# Patient Record
Sex: Female | Born: 1970 | Race: Black or African American | Hispanic: No | Smoking: Never smoker
Health system: Southern US, Community
[De-identification: ages and names within clinical notes are randomized; demographics above are authoritative.]

## PROBLEM LIST (undated history)

## (undated) DIAGNOSIS — F32A Depression, unspecified: Secondary | ICD-10-CM

## (undated) DIAGNOSIS — F419 Anxiety disorder, unspecified: Secondary | ICD-10-CM

## (undated) DIAGNOSIS — F329 Major depressive disorder, single episode, unspecified: Secondary | ICD-10-CM

## (undated) DIAGNOSIS — Z8659 Personal history of other mental and behavioral disorders: Secondary | ICD-10-CM

## (undated) DIAGNOSIS — F319 Bipolar disorder, unspecified: Secondary | ICD-10-CM

## (undated) HISTORY — DX: Bipolar disorder, unspecified: F31.9

## (undated) HISTORY — DX: Personal history of other mental and behavioral disorders: Z86.59

## (undated) HISTORY — DX: Depression, unspecified: F32.A

## (undated) HISTORY — DX: Anxiety disorder, unspecified: F41.9

## (undated) HISTORY — PX: GASTRIC BYPASS: SHX52

---
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
## 1999-01-06 ENCOUNTER — Ambulatory Visit (HOSPITAL_COMMUNITY): Admission: RE | Admit: 1999-01-06 | Discharge: 1999-01-06 | Payer: Self-pay | Admitting: Family Medicine

## 1999-01-06 ENCOUNTER — Encounter: Payer: Self-pay | Admitting: Family Medicine

## 2001-02-05 ENCOUNTER — Other Ambulatory Visit: Admission: RE | Admit: 2001-02-05 | Discharge: 2001-02-05 | Payer: Self-pay | Admitting: Obstetrics and Gynecology

## 2002-12-16 ENCOUNTER — Ambulatory Visit (HOSPITAL_COMMUNITY): Admission: RE | Admit: 2002-12-16 | Discharge: 2002-12-16 | Payer: Self-pay | Admitting: Family Medicine

## 2003-12-03 ENCOUNTER — Ambulatory Visit: Admission: RE | Admit: 2003-12-03 | Discharge: 2003-12-03 | Payer: Self-pay | Admitting: Family Medicine

## 2003-12-03 ENCOUNTER — Ambulatory Visit (HOSPITAL_COMMUNITY): Admission: RE | Admit: 2003-12-03 | Discharge: 2003-12-03 | Payer: Self-pay | Admitting: Family Medicine

## 2004-09-30 ENCOUNTER — Ambulatory Visit: Payer: Self-pay | Admitting: Psychiatry

## 2004-09-30 ENCOUNTER — Inpatient Hospital Stay (HOSPITAL_COMMUNITY): Admission: RE | Admit: 2004-09-30 | Discharge: 2004-10-03 | Payer: Self-pay | Admitting: Psychiatry

## 2004-12-21 ENCOUNTER — Other Ambulatory Visit: Admission: RE | Admit: 2004-12-21 | Discharge: 2004-12-21 | Payer: Self-pay | Admitting: Family Medicine

## 2008-08-07 ENCOUNTER — Encounter: Admission: RE | Admit: 2008-08-07 | Discharge: 2008-08-07 | Payer: Self-pay | Admitting: Family Medicine

## 2010-02-12 ENCOUNTER — Encounter: Payer: Self-pay | Admitting: Family Medicine

## 2010-06-10 NOTE — Discharge Summary (Signed)
[name]             ACCOUNT NO.:  [id number]   MEDICAL RECORD NO.:  [id number]          PATIENT TYPE:  IPS   LOCATION:  0501                          FACILITY:  [facility]   PHYSICIAN:  Syed T. [name], M.D.   DATE OF BIRTH:  [birth year]   DATE OF ADMISSION:  09/30/2004  DATE OF DISCHARGE:  10/03/2004                                 DISCHARGE SUMMARY   IDENTIFYING INFORMATION:  The patient is a 40 year old married African-  American female.  Apparently, she reported suicidal ideation to her  therapist yesterday who recommended for inpatient treatment.  The patient  reported a plan to dehydrate herself and to overdose.  The patient has been  having a lot of mood swings and had a hard time being with people face to  face.  The patient reports emotional issues began in February of 2006 when  she underwent gastric bypass at [facility].  To date, she has lost 111 pounds,  although postoperatively she had many problems.  She has to have PICC line  for at least three times. She said that she has been depressed. Says she has  numerous antidepressant trials including Celexa, Effexor, Cymbalta, and now  currently Prozac. She stated that she just mood swings. Her mood is labile  in the office, and she was here to get her medication corrected.   PAST PSYCHIATRIC HISTORY:  The patient had a similar incident when she was  telling the therapist that she was not doing well with her medication and  was admitted to [facility] for about 10 hours. She stated that  this was because she was refusing to take her nutrition, and she was having  a lot of postoperative pain. Recently, her Effexor was decreased and then  stopped, and this apparently caused a flare in her symptoms at this time.   ALCOHOL AND DRUG HISTORY:  Denies.   MEDICAL HISTORY:  The patient's primary care is through the [facility] at this point. Medical problems:  She takes calcium and  having some reflux for  which she takes Prilosec.   CURRENT MEDICATIONS:  1.  Prozac 20 mg p.o. daily.  2.  Prilosec 20 mg b.i.d.  3.  Calcium 1,000 mg daily.  4.  She takes on and off temazepam 15 mg to get sleep.   POSITIVE PHYSICAL FINDINGS:  The patient still remains morbidly obese  despite 111 pound weight loss to date since February. Her current weight is  285 pounds. She is 5 feet 5 inches. Temperature and vitals are stable.  Physical examination was well documented at [facility], but she has  still G tube site which is healing.   MENTAL STATUS EXAM:  The patient is alert and oriented x3; appropriately  groomed, dressed and nourished. Her speech was rapid at times. Her mood is  fluctuating. Thought processes logical, clear, rational and goal directed.  She said that she wanted to get better. She wanted to go home and reported a  good wife and a mother. Her insight and judgment are intact. Concentration  and memory are good.  She denies any suicidal or homicidal thoughts. She  denies any auditory or visual hallucinations.   DIAGNOSES:  AXIS I:  Mood disorder, not otherwise specified.  AXIS II:  Deferred.  AXIS III:  Status post gastric bypass surgery.  AXIS IV:  Going through her medical condition.  AXIS V:  40.   HOSPITAL COURSE:  The patient was admitted and was started on her regular  medication which was Prozac 20 mg, omeprazole 20 mg, and temazepam 15 mg as  p.r.n. and calcium 1,000 mg daily. She was placed on a safety check and then  participated in group therapy. The patient reported that she had in the past  some mood swings and some racing thoughts. With the milieu therapies, she  started to feel better. Geodon was discussed to start to control her mood  swings and racing thoughts. After one day, the patient stated that does not  need any antipsychotic, and she feels much better. She denies any  hopelessness or helplessness feeling, any suicidal active or passive and  feels much,  much better. She stated that she is ready for discharge. She  reported that her mood has been improved significantly. She appears to be  very articulate, well organized. Denies any thoughts to harm herself. The  patient stated that she wanted to go back to her medication and wanted to  follow up with Dr. Milford Cage on an outpatient basis. Discharge planning  discussed in detail and patient reported no current side effects with the  medication. Her condition on discharge was remarkable improved. Her mood was  euthymic, affect bright. Thought processes logical, goal directed. She  denies any auditory or visual hallucinations. Denies any active or passive  suicidal thoughts. She felt better with increased coping and social skills  and felt ready to be discharged.   DISCHARGE DIAGNOSES:  AXIS I:  Mood disorder, not otherwise specified.  AXIS II:  Deferred.  AXIS III:  Status post gastric bypass.  AXIS IV:  Moderate.  AXIS V:  75.   DISCHARGE MEDICATIONS:  1.  Prozac 20 mg 1 tablet daily.  2.  Restoril 15 mg nightly p.r.n. for insomnia.  3.  K-Dur 20 mEq 1 twice a day for 3 days and recommended to see a family      doctor to have the potassium level rechecked.  4.  Prilosec 20 mg twice daily.   DISCHARGE DISPOSITION:  The patient was discharged to see Dr. Milford Cage  on September 18 at 9:00, phone number [phone]. The patient is also  recommended to see her family physician to recheck her potassium. The  patient acknowledged the appointment and agreed with the followup.      Syed T. Lolly Mustache, M.D.  Electronically Signed     STA/MEDQ  D:  10/24/2004  T:  10/24/2004  Job:  [id number]

## 2010-06-10 NOTE — H&P (Signed)
NAMEJene Ball NO.:  [id number]   MEDICAL RECORD NO.:  [id number]          PATIENT TYPE:  IPS   LOCATION:  0501                          FACILITY:  [facility]   PHYSICIAN:  Geoffery Lyons, M.D.      DATE OF BIRTH:  January 12, 1971   DATE OF ADMISSION:  09/30/2004  DATE OF DISCHARGE:                         PSYCHIATRIC ADMISSION ASSESSMENT   IDENTIFYING INFORMATION:  This is a 40 year old married African-American  female.  Apparently she reported suicidal ideation to her therapist  yesterday who recommended an inpatient stay.  The patient reported a plan to  dehydrate herself and take pills.  She has been having mood swings.  She has  a hard time being with people face to face.   The patient reports emotional issues began in February of 2006 when she  underwent gastric bypass at [facility].  To date she was lost 111 pounds, although  postop she had many problems.  She has had to have a PIC line x3.  She was  thought to be depressed as she has had numerous anti depressive trials  including Celexa, Effexor, Cymbalta, and now currently Prozac.  She states  that she just has mood swings.  Indeed she is quite labile in the office,  and is here to get her medications corrected.   PAST PSYCHIATRIC HISTORY:  She had a similar incident where she was telling  the therapist that she was not doing well with her medications and she was  admitted to the [facility] psychiatric ward for about 10 hours, she stated.  This  was because she was refusing to take her nutrition.  She was having a lot of  postop pain.  Recently her Effexor was decreased and then stopped.  This has  apparently caused a flare in her symptoms at this time.   SOCIAL HISTORY:  She had 2 years of college.  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  She was fired and this  is due to all the times she has been absent since February.   FAMILY HISTORY:  She has one sister who has had some  depression.  Other than  that there is no history for mental illness.   ALCOHOL AND DRUG ABUSE:  She denies.   PAST MEDICAL HISTORY:  Her primary care is through the [facility]  program at this point.  Medical problems are she takes calcium and she is  having some reflux for which she takes Prilosec.  Currently prescribed  medications are Prozac 20 mg p.o. daily, Prilosec 20 mg b.i.d., Calcitite  1000 mg daily, and she has some Temazepam 15 mg at h.s. to help her sleep.   POSITIVE PHYSICAL FINDINGS:  PHYSICAL EXAMINATION:  She still remains  morbidly obese despite a 111 pound weight loss to date since February.  Her  weight currently is 285 pounds.  She is 5 feet 5 inches.  Her temperature is  98.7.  Blood pressure is 142/80 to 164/87.  Pulse is 62 to 67.  Respirations  are 20.  Her physical examination was well documented  at [facility]  but she still has a G-tube site that is healing.   MENTAL STATUS EXAM:  She is alert and oriented x3.  She is appropriately  groomed, dressed and nourished.  Her speech is rapid at times.  Her mood is  fluctuating.  Her thought process is clear, rational and goal oriented.  She  wants to get better.  She wants to go home and be a good wife and a mother.  Judgment and insight are intact.  Concentration and memory are intact.  Specifically she denies suicidal or homicidal ideation.  She denies auditory  or visual hallucinations.   ADMISSION DIAGNOSES:  AXIS I:  Mood disorder not otherwise specified.  AXIS II:  Deferred.  AXIS III:  Status post gastric bypass in February.  AXIS IV:  Just fired.  AXIS V:  Global assessment of function is 31.   PLAN:  Admit for safety and stabilization.  Toward that end, we will put her  on some K-Dur.  Her potassium was a little bit low at 3.1.  She states that  as her intake has been off for the last couple of days, it is easy for  people with gastric bypass to become dehydrated.  Given that weight  gain is  quite an issue for her, we will try some Geodon to see if we can stabilize  her mood and help her not feel so watched and talked about.  We will start  with 40 mg p.o. b.i.d. today and bump it to 80 tomorrow.      Mickie Leonarda Salon, P.A.-C.      Geoffery Lyons, M.D.  Electronically Signed    MD/MEDQ  D:  10/01/2004  T:  10/01/2004  Job:  [id number]

## 2011-05-02 DIAGNOSIS — F603 Borderline personality disorder: Secondary | ICD-10-CM | POA: Insufficient documentation

## 2012-08-05 DIAGNOSIS — L6681 Central centrifugal cicatricial alopecia: Secondary | ICD-10-CM | POA: Insufficient documentation

## 2012-08-05 DIAGNOSIS — L669 Cicatricial alopecia, unspecified: Secondary | ICD-10-CM | POA: Insufficient documentation

## 2013-03-25 ENCOUNTER — Other Ambulatory Visit: Payer: Self-pay

## 2013-03-25 DIAGNOSIS — Z1231 Encounter for screening mammogram for malignant neoplasm of breast: Secondary | ICD-10-CM

## 2013-03-31 ENCOUNTER — Ambulatory Visit: Payer: Self-pay

## 2013-11-10 DIAGNOSIS — G4733 Obstructive sleep apnea (adult) (pediatric): Secondary | ICD-10-CM | POA: Insufficient documentation

## 2013-11-10 DIAGNOSIS — K219 Gastro-esophageal reflux disease without esophagitis: Secondary | ICD-10-CM | POA: Insufficient documentation

## 2016-06-20 ENCOUNTER — Other Ambulatory Visit: Payer: Self-pay | Admitting: Family Medicine

## 2016-06-20 ENCOUNTER — Ambulatory Visit
Admission: RE | Admit: 2016-06-20 | Discharge: 2016-06-20 | Disposition: A | Discharge status: Home / Self Care | Payer: Medicare Other | Source: Ambulatory Visit | Attending: Family Medicine | Admitting: Family Medicine

## 2016-06-20 DIAGNOSIS — R109 Unspecified abdominal pain: Secondary | ICD-10-CM

## 2016-08-30 ENCOUNTER — Other Ambulatory Visit: Payer: Self-pay | Admitting: Family Medicine

## 2016-08-30 ENCOUNTER — Ambulatory Visit
Admission: RE | Admit: 2016-08-30 | Discharge: 2016-08-30 | Disposition: A | Discharge status: Home / Self Care | Payer: Medicare Other | Source: Ambulatory Visit | Attending: Family Medicine | Admitting: Family Medicine

## 2016-08-30 DIAGNOSIS — M25561 Pain in right knee: Secondary | ICD-10-CM

## 2016-12-01 ENCOUNTER — Other Ambulatory Visit: Payer: Self-pay | Admitting: Gastroenterology

## 2016-12-01 DIAGNOSIS — R1012 Left upper quadrant pain: Secondary | ICD-10-CM

## 2016-12-01 DIAGNOSIS — K289 Gastrojejunal ulcer, unspecified as acute or chronic, without hemorrhage or perforation: Secondary | ICD-10-CM

## 2016-12-13 ENCOUNTER — Ambulatory Visit
Admission: RE | Admit: 2016-12-13 | Discharge: 2016-12-13 | Disposition: A | Discharge status: Home / Self Care | Payer: Medicare Other | Source: Ambulatory Visit | Attending: Gastroenterology | Admitting: Gastroenterology

## 2016-12-13 DIAGNOSIS — R1012 Left upper quadrant pain: Secondary | ICD-10-CM

## 2016-12-13 DIAGNOSIS — K289 Gastrojejunal ulcer, unspecified as acute or chronic, without hemorrhage or perforation: Secondary | ICD-10-CM

## 2016-12-13 MED ORDER — IOPAMIDOL (ISOVUE-300) INJECTION 61%
125.0000 mL | Freq: Once | INTRAVENOUS | Status: AC | PRN
  Administered 2016-12-13: 125 mL via INTRAVENOUS

## 2017-04-21 LAB — GLUCOSE, POCT (MANUAL RESULT ENTRY): POC Glucose: 69 mg/dl — AB (ref 70–99)

## 2017-07-25 ENCOUNTER — Other Ambulatory Visit: Payer: Self-pay

## 2017-07-25 ENCOUNTER — Emergency Department (HOSPITAL_COMMUNITY)
Admission: EM | Admit: 2017-07-25 | Discharge: 2017-07-26 | Disposition: A | Discharge status: Home / Self Care | Payer: Medicare Other | Attending: Emergency Medicine | Admitting: Emergency Medicine

## 2017-07-25 ENCOUNTER — Encounter (HOSPITAL_COMMUNITY): Payer: Self-pay | Admitting: Emergency Medicine

## 2017-07-25 ENCOUNTER — Emergency Department (HOSPITAL_COMMUNITY): Payer: Medicare Other

## 2017-07-25 DIAGNOSIS — R0789 Other chest pain: Secondary | ICD-10-CM

## 2017-07-25 LAB — CBC
HCT: 35.7 % — ABNORMAL LOW (ref 36.0–46.0)
Hemoglobin: 10.9 g/dL — ABNORMAL LOW (ref 12.0–15.0)
MCH: 24.8 pg — ABNORMAL LOW (ref 26.0–34.0)
MCHC: 30.5 g/dL (ref 30.0–36.0)
MCV: 81.3 fL (ref 78.0–100.0)
Platelets: 269 10*3/uL (ref 150–400)
RBC: 4.39 MIL/uL (ref 3.87–5.11)
RDW: 16.4 % — ABNORMAL HIGH (ref 11.5–15.5)
WBC: 8.9 10*3/uL (ref 4.0–10.5)

## 2017-07-25 NOTE — ED Triage Notes (Signed)
Pt reports she was sleeping and awoke with L sided CP under her breast into her back. N/ and diaphoresis. Denies cardiac hx. No ASA today.

## 2017-07-26 DIAGNOSIS — R0789 Other chest pain: Secondary | ICD-10-CM | POA: Diagnosis not present

## 2017-07-26 LAB — BASIC METABOLIC PANEL
Anion gap: 6 (ref 5–15)
BUN: 10 mg/dL (ref 6–20)
CO2: 26 mmol/L (ref 22–32)
Calcium: 8.8 mg/dL — ABNORMAL LOW (ref 8.9–10.3)
Chloride: 106 mmol/L (ref 98–111)
Creatinine, Ser: 1.01 mg/dL — ABNORMAL HIGH (ref 0.44–1.00)
GFR calc Af Amer: >60 mL/min (ref >60)
GFR calc non Af Amer: >60 mL/min (ref >60)
Glucose, Bld: 83 mg/dL (ref 70–99)
Potassium: 4 mmol/L (ref 3.5–5.1)
Sodium: 138 mmol/L (ref 135–145)

## 2017-07-26 LAB — I-STAT TROPONIN, ED: Troponin i, poc: 0 ng/mL (ref 0.00–0.08)

## 2017-07-26 LAB — RAPID URINE DRUG SCREEN, HOSP PERFORMED
Amphetamines: NOT DETECTED
Benzodiazepines: NOT DETECTED
Cocaine: NOT DETECTED
Opiates: NOT DETECTED
Tetrahydrocannabinol: NOT DETECTED

## 2017-07-26 LAB — D-DIMER, QUANTITATIVE (NOT AT ARMC): D-Dimer, Quant: <0.27 ug/mL-FEU (ref 0.00–0.50)

## 2017-07-26 LAB — LITHIUM LEVEL: Lithium Lvl: 0.89 mmol/L (ref 0.60–1.20)

## 2017-07-26 LAB — PREGNANCY, URINE: Preg Test, Ur: NEGATIVE

## 2017-07-26 LAB — TROPONIN I
Troponin I: <0.03 ng/mL (ref <0.03)
Troponin I: <0.03 ng/mL (ref <0.03)

## 2017-07-26 MED ORDER — GI COCKTAIL ~~LOC~~
30.0000 mL | Freq: Once | ORAL | Status: AC
  Administered 2017-07-26: 30 mL via ORAL
  Filled 2017-07-26: qty 30

## 2017-07-26 MED ORDER — KETOROLAC TROMETHAMINE 30 MG/ML IJ SOLN
30.0000 mg | Freq: Once | INTRAMUSCULAR | Status: AC
  Administered 2017-07-26: 30 mg via INTRAVENOUS
  Filled 2017-07-26: qty 1

## 2017-07-26 MED ORDER — OMEPRAZOLE 20 MG PO CPDR: 20.0000 mg | DELAYED_RELEASE_CAPSULE | Freq: Every day | ORAL | 0 refills | Status: DC

## 2017-07-26 MED ORDER — NYSTATIN 100000 UNIT/GM EX CREA: TOPICAL_CREAM | CUTANEOUS | 0 refills | Status: DC

## 2017-07-26 NOTE — Discharge Instructions (Addendum)
There is no evidence of heart attack or blood clot in the lung.  As we discussed your low risk for heart disease but should follow-up for a stress test with your doctor.  If your chest pain becomes worse, associated with exertion, shortness of breath, nausea, vomiting or sweating or any other concerns.

## 2017-07-26 NOTE — ED Provider Notes (Signed)
[facility] EMERGENCY DEPARTMENT Provider Note   CSN: [id number] Arrival date & time: 07/25/17  2258     History   Chief Complaint Chief Complaint  Patient presents with  . Chest Pain    HPI Susan Ball is a 47 y.o. female.  Patient presents with left-sided chest pain that woke her from sleep.  It starts underneath her left breast and radiates to her side and back.  The pain is constant.  She has not had this kind of pain in the past.  She denies any cough, shortness of breath or fever.  She does have some nausea but no diaphoresis.  She denies any cardiac history.  She is being treated for a possible bug bite to her right ankle and is been on doxycycline for the past 3 days.  She did not see what bit here but thinks it may have been a tick.  Her pain is not worse with exertion or deep breathing.  Denies any rash.  The history is provided by the patient.  Chest Pain   Pertinent negatives include no abdominal pain, no dizziness, no fever, no headaches, no nausea, no shortness of breath, no vomiting and no weakness.    History reviewed. No pertinent past medical history.  There are no active problems to display for this patient.   Past Surgical History:  Procedure Laterality Date  . GASTRIC BYPASS       OB History   None      Home Medications    Prior to Admission medications   Not on File    Family History History reviewed. No pertinent family history.  Social History Social History   Tobacco Use  . Smoking status: Never Smoker  . Smokeless tobacco: Never Used  Substance Use Topics  . Alcohol use: Not Currently  . Drug use: Not Currently     Allergies   Patient has no known allergies.   Review of Systems Review of Systems  Constitutional: Negative for activity change, appetite change and fever.  HENT: Negative for congestion, mouth sores and nosebleeds.   Eyes: Negative for visual disturbance.  Respiratory: Positive for chest tightness.  Negative for shortness of breath.   Cardiovascular: Positive for chest pain.  Gastrointestinal: Negative for abdominal pain, nausea and vomiting.  Genitourinary: Negative for dysuria, hematuria, vaginal bleeding and vaginal discharge.  Musculoskeletal: Negative for arthralgias and myalgias.  Skin: Positive for wound.  Neurological: Negative for dizziness, weakness and headaches.     Physical Exam Updated Vital Signs BP (!) 142/78 (BP Location: Right Arm)   Pulse 63   Temp 98.4 F (36.9 C) (Oral)   Resp 18   Ht 5\' 5"  (1.651 m)   Wt 108 kg (238 lb)   LMP 07/11/2017   SpO2 100%   BMI 39.61 kg/m   Physical Exam  Constitutional: She is oriented to person, place, and time. She appears well-developed and well-nourished. No distress.  HENT:  Head: Normocephalic and atraumatic.  Mouth/Throat: Oropharynx is clear and moist. No oropharyngeal exudate.  Eyes: Pupils are equal, round, and reactive to light. Conjunctivae and EOM are normal.  Neck: Normal range of motion. Neck supple.  No meningismus.  Cardiovascular: Normal rate, regular rhythm, normal heart sounds and intact distal pulses.  No murmur heard. Pulmonary/Chest: Effort normal and breath sounds normal. No respiratory distress. She exhibits tenderness.  Chaperone present.  Patient indicates her chest pain is underneath her left breast where she has hyper keratotic and erythematous skin.  She denies any tenderness to palpation of this area or along her ribs.  Abdominal: Soft. There is no tenderness. There is no rebound and no guarding.  Musculoskeletal: Normal range of motion. She exhibits no edema or tenderness.  Hyperpigmentation and induration to right ankle without fluctuance  Neurological: She is alert and oriented to person, place, and time. No cranial nerve deficit. She exhibits normal muscle tone. Coordination normal.  No ataxia on finger to nose bilaterally. No pronator drift. 5/5 strength throughout. CN 2-12 intact.Equal  grip strength. Sensation intact.   Skin: Skin is warm. Capillary refill takes less than 2 seconds. No rash noted.  Psychiatric: She has a normal mood and affect. Her behavior is normal.  Nursing note and vitals reviewed.    ED Treatments / Results  Labs (all labs ordered are listed, but only abnormal results are displayed) Labs Reviewed  BASIC METABOLIC PANEL - Abnormal; Notable for the following components:      Result Value   Creatinine, Ser 1.01 (*)    Calcium 8.8 (*)    All other components within normal limits  CBC - Abnormal; Notable for the following components:   Hemoglobin 10.9 (*)    HCT 35.7 (*)    MCH 24.8 (*)    RDW 16.4 (*)    All other components within normal limits  RAPID URINE DRUG SCREEN, HOSP PERFORMED - Abnormal; Notable for the following components:   Barbiturates   (*)    Value: Result not available. Reagent lot number recalled by manufacturer.   All other components within normal limits  TROPONIN I  PREGNANCY, URINE  D-DIMER, QUANTITATIVE (NOT AT [facility])  LITHIUM LEVEL  TROPONIN I  I-STAT TROPONIN, ED    EKG EKG Interpretation  Date/Time:  Wednesday July 25 2017 23:39:20 EDT Ventricular Rate:  60 PR Interval:  144 QRS Duration: 90 QT Interval:  451 QTC Calculation: 451 R Axis:   27 Text Interpretation:  Sinus rhythm No significant change was found Confirmed by Glynn Octaveancour, Makael Stein [id number]) on 07/25/2017 11:45:24 PM   Radiology Dg Chest 2 View  Result Date: 07/26/2017 CLINICAL DATA:  Chest pain EXAM: CHEST - 2 VIEW COMPARISON:  06/24/2014 FINDINGS: The heart size and mediastinal contours are within normal limits. Both lungs are clear. The visualized skeletal structures are unremarkable. IMPRESSION: No active cardiopulmonary disease. Electronically Signed   By: Jasmine PangKim  Fujinaga M.D.   On: 07/26/2017 00:42    Procedures Procedures (including critical care time)  Medications Ordered in ED Medications  ketorolac (TORADOL) 30 MG/ML injection 30 mg (30 mg  Intravenous Given 07/26/17 0039)  gi cocktail (Maalox,Lidocaine,Donnatal) (30 mLs Oral Given 07/26/17 0040)     Initial Impression / Assessment and Plan / ED Course  I have reviewed the triage vital signs and the nursing notes.  Pertinent labs & imaging results that were available during my care of the patient were reviewed by me and considered in my medical decision making (see chart for details).    Patient with left-sided chest pain that woke her from sleep about 1 hour ago.  Her EKG is normal sinus rhythm.  Patient given Toradol for suspected musculoskeletal chest pain.  She does have intertriginous rash underneath her breast at the area of pain.  Troponin is negative.  D-dimer is negative. Heart score 1-2.  Delta troponin is negative.  Patient's chest pain has resolved after Toradol.  Low suspicion for ACS, pulmonary embolism, aortic dissection.  We will treat supportively with empiric PPI.  We will  also give nystatin cream for intertriginous rash.  Follow-up with PCP.  Return precautions discussed including worsening chest pain especially that is exertional, associated with shortness of breath, diaphoresis, vomiting or other concerns.  Final Clinical Impressions(s) / ED Diagnoses   Final diagnoses:  Atypical chest pain    ED Discharge Orders    None       Pheng Prokop, Jeannett Senior, MD 07/26/17 [phone]

## 2017-08-16 LAB — GLUCOSE, POCT (MANUAL RESULT ENTRY): POC Glucose: 75 mg/dl (ref 70–99)

## 2019-03-12 ENCOUNTER — Other Ambulatory Visit: Payer: Self-pay

## 2019-03-12 ENCOUNTER — Other Ambulatory Visit (HOSPITAL_COMMUNITY): Payer: Medicare Other | Attending: Psychiatry | Admitting: Psychiatry

## 2019-03-12 DIAGNOSIS — Z9884 Bariatric surgery status: Secondary | ICD-10-CM | POA: Insufficient documentation

## 2019-03-12 DIAGNOSIS — F603 Borderline personality disorder: Secondary | ICD-10-CM | POA: Insufficient documentation

## 2019-03-12 DIAGNOSIS — F319 Bipolar disorder, unspecified: Secondary | ICD-10-CM | POA: Insufficient documentation

## 2019-03-12 DIAGNOSIS — F4310 Post-traumatic stress disorder, unspecified: Secondary | ICD-10-CM | POA: Insufficient documentation

## 2019-03-12 DIAGNOSIS — F419 Anxiety disorder, unspecified: Secondary | ICD-10-CM | POA: Insufficient documentation

## 2019-03-12 DIAGNOSIS — Z79899 Other long term (current) drug therapy: Secondary | ICD-10-CM | POA: Insufficient documentation
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
## 2019-03-13 ENCOUNTER — Encounter (HOSPITAL_COMMUNITY): Payer: Self-pay | Admitting: Psychiatry

## 2019-03-13 ENCOUNTER — Other Ambulatory Visit: Payer: Self-pay

## 2019-03-13 ENCOUNTER — Other Ambulatory Visit (HOSPITAL_COMMUNITY): Payer: Medicare Other | Admitting: Psychiatry

## 2019-03-13 DIAGNOSIS — F319 Bipolar disorder, unspecified: Secondary | ICD-10-CM | POA: Diagnosis present

## 2019-03-13 DIAGNOSIS — Z9884 Bariatric surgery status: Secondary | ICD-10-CM | POA: Diagnosis not present

## 2019-03-13 DIAGNOSIS — F603 Borderline personality disorder: Secondary | ICD-10-CM | POA: Diagnosis not present

## 2019-03-13 DIAGNOSIS — F331 Major depressive disorder, recurrent, moderate: Secondary | ICD-10-CM

## 2019-03-13 DIAGNOSIS — F419 Anxiety disorder, unspecified: Secondary | ICD-10-CM | POA: Diagnosis not present

## 2019-03-13 DIAGNOSIS — Z79899 Other long term (current) drug therapy: Secondary | ICD-10-CM | POA: Diagnosis not present

## 2019-03-13 DIAGNOSIS — F4310 Post-traumatic stress disorder, unspecified: Secondary | ICD-10-CM | POA: Diagnosis not present

## 2019-03-13 NOTE — Progress Notes (Signed)
Virtual Visit via Video Note  I connected with Susan Ball on 03/13/19 at  9:00 AM EST by a video enabled telemedicine application and verified that I am speaking with the correct person using two identifiers.  Location: Patient: Patient Home Provider: Home Office   Case Manger discussed the limitations of evaluation and management by telemedicine and the availability of in person appointments during orientation. The patient expressed understanding and agreed to proceed.  History of Present Illness: MDD  Observations/Objective: Case Manager checked in with all participants to review discharge dates, insurance authorizations, work-related documents and needs for the treatment team. Counselor introduced guest speaker, Sheppard Coil, [employer] Educator, to facilitate a discussion around Grief and Loss topics. Patient participated in discussion and shared insights about their own needs regarding this topic. Counselor allowed time for reflection and to journal on the topic of grief/loss and promoted continuing this work in individual therapy and local support groups.   Counselor facilitated a check in with group members to gage mood and current functioning as well as their takeaways from the presentation. Today is Susan Ball's first IOP treatment session, so Counselor prompted her to share what brought her to treatment, what she is hoping to gain from the experience, current life stressors and about her natural support system.  Susan Ball shared about the impact of COVID on her coping abilities and mental health. Susan Ball noted chronic mental health concerns and need for medication evaluation, as well as new coping strategies. Susan Ball noted that exercising is her most helpful coping skill at this time. She is supported by her husband and her son. [name] presents with severe depression and moderate anxiety.   Counselor introduced Doctor, hospital, Forde Radon, Yoga Instructor, to guide the group in a yoga practice  to promote mind and body connection, mindfulness and relaxation. Counselor checked in with all participants to assess the benefits.   Assessment and Plan: Counselor recommends that patient remains in IOP treatment to better manage mental health symptoms and continue to address treatment plan goals. Counselor recommends adherence to crisis/safety plan, taking medications as prescribed and following up with medical professionals if any issues arise.   Follow Up Instructions: Counselor will send Webex link for next session.    I discussed the assessment and treatment plan with the patient. The patient was provided an opportunity to ask questions and all were answered. The patient agreed with the plan and demonstrated an understanding of the instructions.   The patient was advised to call back or seek an in-person evaluation if the symptoms worsen or if the condition fails to improve as anticipated.  I provided 180 minutes of non-face-to-face time during this encounter.   Hilbert Odor, LCSW

## 2019-03-14 ENCOUNTER — Other Ambulatory Visit (HOSPITAL_COMMUNITY): Payer: Medicare Other | Admitting: Psychiatry

## 2019-03-14 ENCOUNTER — Other Ambulatory Visit: Payer: Self-pay

## 2019-03-14 ENCOUNTER — Encounter (HOSPITAL_COMMUNITY): Payer: Self-pay

## 2019-03-14 DIAGNOSIS — F331 Major depressive disorder, recurrent, moderate: Secondary | ICD-10-CM

## 2019-03-14 DIAGNOSIS — F319 Bipolar disorder, unspecified: Secondary | ICD-10-CM | POA: Diagnosis not present

## 2019-03-14 NOTE — Progress Notes (Signed)
Virtual Visit via Telephone Note  I connected with Susan Ball on 03/14/19 at  9:00 AM EST by telephone and verified that I am speaking with the correct person using two identifiers.   I discussed the limitations, risks, security and privacy concerns of performing an evaluation and management service by telephone and the availability of in person appointments. I also discussed with the patient that there may be a patient responsible charge related to this service. The patient expressed understanding and agreed to proceed.   I discussed the assessment and treatment plan with the patient. The patient was provided an opportunity to ask questions and all were answered. The patient agreed with the plan and demonstrated an understanding of the instructions.   The patient was advised to call back or seek an in-person evaluation if the symptoms worsen or if the condition fails to improve as anticipated.  I provided 30 minutes of non-face-to-face time during this encounter.   [name], NP
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
## 2019-03-17 ENCOUNTER — Other Ambulatory Visit (HOSPITAL_COMMUNITY): Payer: Medicare Other

## 2019-03-17 ENCOUNTER — Other Ambulatory Visit: Payer: Self-pay
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
## 2019-03-18 ENCOUNTER — Other Ambulatory Visit: Payer: Self-pay

## 2019-03-18 ENCOUNTER — Other Ambulatory Visit (HOSPITAL_COMMUNITY): Payer: Medicare Other | Admitting: Psychiatry

## 2019-03-18 ENCOUNTER — Encounter (HOSPITAL_COMMUNITY): Payer: Self-pay

## 2019-03-18 DIAGNOSIS — F319 Bipolar disorder, unspecified: Secondary | ICD-10-CM | POA: Diagnosis not present

## 2019-03-18 DIAGNOSIS — F331 Major depressive disorder, recurrent, moderate: Secondary | ICD-10-CM
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
## 2019-03-19 ENCOUNTER — Other Ambulatory Visit: Payer: Self-pay

## 2019-03-19 ENCOUNTER — Encounter (HOSPITAL_COMMUNITY): Payer: Self-pay

## 2019-03-19 ENCOUNTER — Other Ambulatory Visit (HOSPITAL_COMMUNITY): Payer: Medicare Other | Admitting: Psychiatry

## 2019-03-19 DIAGNOSIS — F331 Major depressive disorder, recurrent, moderate: Secondary | ICD-10-CM

## 2019-03-19 DIAGNOSIS — F319 Bipolar disorder, unspecified: Secondary | ICD-10-CM | POA: Diagnosis not present

## 2019-03-19 NOTE — Progress Notes (Signed)
Virtual Visit via Video Note  I connected with Susan Ball on 03/19/19 at  9:00 AM EST by a video enabled telemedicine application and verified that I am speaking with the correct person using two identifiers.  Location: Patient: Patient Home Provider: Home Office   Case Manger discussed the limitations of evaluation and management by telemedicine and the availability of in person appointments during orientation. The patient expressed understanding and agreed to proceed.  History of Present Illness: MDD   Observations/Objective: Case Manager checked in with all participants to review discharge dates, insurance authorizations, work-related documents and needs for the treatment team.   Counselor introduced our guest speaker, Peggye Fothergill, [facility] Pharmacist, who shared about psychiatric medications, side effects, treatment considerations and how to communicate with medical professionals. Group Members asked questions and shared medication concerns. Counselor prompted group members to reference a worksheet called, "Body Scan" to jot down questions and concerns about their physical health in preparation for their upcoming appointments with medical professionals. Susan Ball was not able to identify any medical issues at this time. Counselor encouraged routine medical check-ups, preparing for appointments, following up with recommendations and seeking specialist if needed.   Counselor introduced the topic of Resiliency to the group, prompting them to share their understanding of the concept. Counselor then facilitated a Resiliency Questionnaire for the patients to assessed their score based on experiences and interactions with caregivers/trusted adults in childhood. Counselor shared psychoeducation on the components of Resiliency and prompted group members to share about their resiliency score and what they attribute to their resiliency. Patient shared that she has a motto of "keep going, keep wishing, the  strength is within me"." She attributes her resiliency to her strong faith and belief system. She scored a 10/14.   Counselor shifted to discussing the concept of self-compassion, sharing information from Dr. Baxter Hire Neff's research on the components and assessing the patients understanding of how to implement practices within their own lives. Patient shared that she actively self-soothes when she isn't feeling well. She would like to reward self more and let herself off the hook for negative thoughts and interactions with others.   Counselor prompted group members to share a self-care task and productivity activity they will do between now and the next group treatment session to alleviate stress. Susan Ball would like to work out at [employer] today and cook a meal for her family.   Assessment and Plan: Counselor recommends that patient remains in IOP treatment to better manage mental health symptoms and continue to address treatment plan goals. Counselor recommends adherence to crisis/safety plan, taking medications as prescribed and following up with medical professionals if any issues arise.   Follow Up Instructions: Counselor will send Webex link for next session.    I discussed the assessment and treatment plan with the patient. The patient was provided an opportunity to ask questions and all were answered. The patient agreed with the plan and demonstrated an understanding of the instructions.   The patient was advised to call back or seek an in-person evaluation if the symptoms worsen or if the condition fails to improve as anticipated.  I provided 180 minutes of non-face-to-face time during this encounter.   Hilbert Odor, LCSW

## 2019-03-20 ENCOUNTER — Encounter (HOSPITAL_COMMUNITY): Payer: Self-pay

## 2019-03-20 ENCOUNTER — Other Ambulatory Visit: Payer: Self-pay

## 2019-03-20 ENCOUNTER — Other Ambulatory Visit (HOSPITAL_COMMUNITY): Payer: Medicare Other | Admitting: Licensed Clinical Social Worker

## 2019-03-20 DIAGNOSIS — F319 Bipolar disorder, unspecified: Secondary | ICD-10-CM | POA: Diagnosis not present

## 2019-03-20 DIAGNOSIS — F331 Major depressive disorder, recurrent, moderate: Secondary | ICD-10-CM

## 2019-03-20 NOTE — Progress Notes (Signed)
Virtual Visit via Video Note  I connected with Susan Ball on 03/20/19 at  9:00 AM EST by a video enabled telemedicine application and verified that I am speaking with the correct person using two identifiers.  Location: Patient: Patient Home Provider: Home Office   Case Manger discussed the limitations of evaluation and management by telemedicine and the availability of in person appointments during orientation. The patient expressed understanding and agreed to proceed.  History of Present Illness: MDD  Observations/Objective: Case Manager checked in with all participants to review discharge dates, insurance authorizations, work-related documents and needs for the treatment team. Counselor introduced guest speaker, Susan Ball, [employer] Educator, to facilitate a discussion around Grief and Loss topics. Patient participated in discussion and shared insights about their own needs regarding this topic. Susan Ball stated that she preferred to listen to the information shared and wasn't able to verbally express the impact of loss in her life. Counselor allowed time for reflection and to journal on the topic of grief/loss and promoted continuing this work in individual therapy and local support groups.   Counselor engaged the group in self-soothing practices using a guide from Dr. Haze Rushing, prompting them to share which was most soothing for them. Patient shared noted that she preferred resting her head on her hands in a leaning position. She noted it reminded her of being comforted by her husband.    Counselor facilitated a check in with group members to gage mood and current functioning as well as their takeaways from the presentation. Susan Ball noted that she did not have the drive to cook a meal for her family yesterday. She took time to do her hair and to work out at [employer]. She plans to get blood work done today. Group additionally engaged in discussion about Mental Health advocacy on  the job, highlighting how to set boundaries, communicate needs and transition back in a healthy way.  Counselor introduced Event organiser from [employer] to present on their programming. Susan Ball shared about Peer Support, Support Groups and Wellness Programs to [employer] of mental health symptoms. Group members gathered information and shared about which events most sparked their interest and their plans to engage in services. Susan Ball notes that she would like to get connected with the WRAP class, Road to Recovery Support Group and the Friends and Family Support Group.   Assessment and Plan: Counselor recommends that patient remains in IOP treatment to better manage mental health symptoms and continue to address treatment plan goals. Counselor recommends adherence to crisis/safety plan, taking medications as prescribed and following up with medical professionals if any issues arise.   Follow Up Instructions: Counselor will send Webex link for next session.    I discussed the assessment and treatment plan with the patient. The patient was provided an opportunity to ask questions and all were answered. The patient agreed with the plan and demonstrated an understanding of the instructions.   The patient was advised to call back or seek an in-person evaluation if the symptoms worsen or if the condition fails to improve as anticipated.  I provided 180 minutes of non-face-to-face time during this encounter.   Hilbert Odor, LCSW

## 2019-03-21 ENCOUNTER — Other Ambulatory Visit (HOSPITAL_COMMUNITY): Payer: Medicare Other

## 2019-03-21 ENCOUNTER — Other Ambulatory Visit: Payer: Self-pay

## 2019-03-24 ENCOUNTER — Other Ambulatory Visit (HOSPITAL_COMMUNITY): Payer: Medicare Other | Attending: Psychiatry | Admitting: Psychiatry

## 2019-03-24 ENCOUNTER — Other Ambulatory Visit: Payer: Self-pay

## 2019-03-24 DIAGNOSIS — Z79899 Other long term (current) drug therapy: Secondary | ICD-10-CM | POA: Diagnosis not present

## 2019-03-24 DIAGNOSIS — Z915 Personal history of self-harm: Secondary | ICD-10-CM | POA: Insufficient documentation

## 2019-03-24 DIAGNOSIS — F331 Major depressive disorder, recurrent, moderate: Secondary | ICD-10-CM

## 2019-03-24 DIAGNOSIS — F319 Bipolar disorder, unspecified: Secondary | ICD-10-CM | POA: Insufficient documentation

## 2019-03-24 DIAGNOSIS — F603 Borderline personality disorder: Secondary | ICD-10-CM | POA: Diagnosis not present

## 2019-03-24 NOTE — Progress Notes (Signed)
Virtual Visit via Video Note   I connected with Susan Ball, who prefers to go by "Susan Ball" on 03/24/19 at  9:00 AM EST by a video enabled telemedicine application and verified that I am speaking with the correct person using two identifiers.   Location: Patient: Patient Home Provider: [facility] OPT Office   Case Manager discussed the limitations of evaluation and management by telemedicine and the availability of in person appointments during orientation. The patient expressed understanding and agreed to proceed.   History of Present Illness: MDD   Observations/Objective: Case Manager checked in with all participants to review discharge dates, insurance authorizations, work-related documents and needs for the treatment team.    Counselor co-facilitated group with Hilbert Odor, LCSW today.  Two [employer] students were also allowed to observe with approval from group members.  Counselor facilitated a check-in with group members to gauge current mood, functioning, and also identify recent progress made towards treatment goals. Susan Ball reported that this past week was difficult due to stress at work, and she is frustrated by Insurance account manager.  Susan Ball reported that she came home the other day and began crying because of how she was feeling after the shift, and her husband pointed out that she appears to be bringing the stress home now rather than leaving it at work.  Susan Ball reported that she was able to exercise on Saturday and Sunday to vent some of her stress, and she intends to make this a regular routine to help cope.     Counselor engaged the group in discussion on managing work/life balance today to improve mental health and wellness.  Counselor explained how finding balance between responsibilities at home and work place can be challenging, lead to increased stress, and this has been further complicated by recent pandemic leading to unemployment, more virtual work, and blurring of lines between home as a place of rest  or work duties.  Counselor facilitated discussion on what challenges members have faced with this issue historically, as well as what, if any, issues have arisen following pandemic.  Susan Ball reported that she enjoyed her old position at work, but was forced to transition recently, which led to her increased stress.  Susan Ball reported that she used to feel like she had more control of her schedule and personal autonomy, but feels more helpless now and lacks support.  Susan Ball also reported that due to being an introvert, she does not socialize much, and disclosed that she will spend a lot of free time at home, alone, not engaging in activity.      Counselor also discussed strategies for improving work/life balance while members work on their mental health during treatment.  Some of these included keeping track of time management; creating a list of priorities and scaling importance; setting realistic, measurable goals each day; establishing boundaries; taking care of health needs; and nurturing relationships at home and work for support.  Counselor inquired about areas where members feel they are excelling, as well as areas they could focus on during treatment.  Susan Ball reported that she is typically a very hard and efficient worker, excelling at tracking use of time, prioritizing tasks, and accomplishing goals day to day.  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  Susan Ball was receptive to suggestions from counselor and other members to coordinate with co-workers at  work to ensure fair distribution of responsibilities, be patient and understanding with herself, and explore more hobbies to engage in during idle time outside of work for emotional outlet.  Susan Ball reiterated "I have to stop coming home with it".     Assessment and Plan: Counselor recommends  that patient remains in IOP treatment to better manage mental health symptoms and continue to address treatment plan goals. Counselor recommends adherence to crisis/safety plan, taking medications as prescribed and following up with medical professionals if any issues arise.    Follow Up Instructions: Counselor will send Webex link for next session.    I discussed the assessment and treatment plan with the patient. The patient was provided an opportunity to ask questions and all were answered. The patient agreed with the plan and demonstrated an understanding of the instructions.   The patient was advised to call back or seek an in-person evaluation if the symptoms worsen or if the condition fails to improve as anticipated.   I provided 180 minutes of non-face-to-face time during this encounter.  Shade Flood, LCSW, LCAS

## 2019-03-25 ENCOUNTER — Other Ambulatory Visit (HOSPITAL_COMMUNITY): Payer: Medicare Other | Admitting: Psychiatry

## 2019-03-25 ENCOUNTER — Other Ambulatory Visit: Payer: Self-pay

## 2019-03-25 ENCOUNTER — Encounter (HOSPITAL_COMMUNITY): Payer: Self-pay

## 2019-03-25 DIAGNOSIS — F331 Major depressive disorder, recurrent, moderate: Secondary | ICD-10-CM

## 2019-03-25 DIAGNOSIS — F319 Bipolar disorder, unspecified: Secondary | ICD-10-CM | POA: Diagnosis not present

## 2019-03-25 NOTE — Progress Notes (Signed)
Virtual Visit via Video Note  I connected with [name] on 03/25/19 at  9:00 AM EST by a video enabled telemedicine application and verified that I am speaking with the correct person using two identifiers.  Location: Patient: Patient Home Provider: Home Office  Case Manger discussed the limitations of evaluation and management by telemedicine and the availability of in person appointments during orientation. The patient expressed understanding and agreed to proceed.   History of Present Illness: MDD   Observations/Objective: Case Manager checked in with all participants to review discharge dates, insurance authorizations, work-related documents and needs for the treatment team. Counselor processed current mood and functioning and discussed how participants spent their time since last session and if skills were applied. Patient shared that she had a "really, really good day", noting improved mood, smiling and laughing as she shared about her day. [name] noted that she went to the gym at a different time than normal, and enjoyed the difference in age range/skill level of those present. She discussed being motivated to show off her weight lifting skills to the others. Noting it boosted her self-esteem and self-confidence. [name] presents with mild anxiety and moderate depression.   Counselor provided psychoeducation on cognitive distortions, exploring their baseline understanding, providing a educational video of 15 cognitive distortions, presenting 10 strategies for changing cognitive distortions to rational thinking. Counselor facilitated a therapeutic CBT activity to rewrite negative automatic thoughts using techniques learned from the 10 strategies. Group members shared their work with each other highlighting new thoughts and challenges they faced with the activity. Patient shared that she is a "black and white" thinker, having difficulties with acknowledging or believing in grey areas. She is  very rules oriented, making the "shoulds and should not" distortions a challenge for her. She used the strategies to rewrite common automatic thoughts for her, but found it challenging.   Counselor ended session by acknowledging a graduating group member by prompting graduating member to reflect on progress made, takeaways from treatment and plan for stepping down. Counselor and group members shared observations of growth, encouragement and support as she transitions out of the program.   Counselor prompted group members to share a self-care task and productivity activity they will do between now and the next group treatment session to alleviate stress. 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   Assessment and Plan: Counselor recommends that patient remains in IOP treatment to better manage mental health symptoms and continue to address treatment plan goals. Counselor recommends adherence to crisis/safety plan, taking medications as prescribed and following up with medical professionals if any issues arise.   Follow Up Instructions: Counselor will send Webex link for next session.  The patient was advised to call back or seek an in-person evaluation if the symptoms worsen or if the condition fails to improve as anticipated.  I provided 180 minutes of non-face-to-face time during this encounter.   Hilbert Odor, LCSW

## 2019-03-26 ENCOUNTER — Other Ambulatory Visit: Payer: Self-pay

## 2019-03-26 ENCOUNTER — Other Ambulatory Visit (HOSPITAL_COMMUNITY): Payer: Medicare Other | Admitting: Licensed Clinical Social Worker

## 2019-03-26 DIAGNOSIS — F319 Bipolar disorder, unspecified: Secondary | ICD-10-CM | POA: Diagnosis not present

## 2019-03-26 DIAGNOSIS — F331 Major depressive disorder, recurrent, moderate: Secondary | ICD-10-CM

## 2019-03-26 NOTE — Progress Notes (Signed)
Virtual Visit via Video Note   I connected with Susan Ball, who prefers to go by "Susan Ball", on 03/26/19 at 9:00 AM EST by a video enabled telemedicine application and verified that I am speaking with the correct person using two identifiers.   Location: Patient: Patient Home Provider: [name]   Case Manager discussed the limitations of evaluation and management by telemedicine and the availability of in person appointments during orientation. The patient expressed understanding and agreed to proceed.   History of Present Illness: MDD   Observations/Objective: Case Manager checked in with all participants to review discharge dates, insurance authorizations, work-related documents and needs for the treatment team. Counselor facilitated a check-in with group members to gauge mood and current functioning as well as identify recent progress towards treatment goals.  Susan Ball reported that she was doing well today and did not have much to share.  She reported that she had taken the day off from work and was spending time relaxing at home to de-stress, and did not have plans.     Counselor provided recap on psychoeducation regarding cognitive distortions, including which ones members felt they displayed most often, and how these have impacted mood and behavior.  Counselor also did recap of strategies which could be implemented to challenge irrational thinking, including writing down negative thoughts in a journal to label and confront more effectively, looking for evidence that supports questionable thinking, making an effort to challenge negative self-talk by replacing it with more positive phrases such as "You've got this", "This will pass", etc, and attempting to replace negative labels (failure, loser, etc) with more positive, uplifting ones. Counselor attempted to get Susan Ball engaged in conversation, but she wrote in chat window that she was "Here just can't respond" and had a low signal.  Clinician  acknowledged this in chat, but she did not send anymore messages regarding this subject.       Counselor also provided psychoeducation on personal boundaries today.  Counselor provided worksheet via chat which explained how these are the limits and rules we place upon ourselves in relationships, and involves the ability to say "No" to unreasonable demands.  Common traits for different categories were covered, including rigid, porous, and healthy boundaries.  Members were encouraged to consider which traits they most often display, and discussion centered upon how this impacts interactions with supports, and whether changes could be made to improve relationships.  Counselor again tried to get Susan Ball involved in discussion by asking in chat which boundary traits she could relate to, but she did not respond until roughly 10 minutes later, stating "I feel like I've waste these 3 hours of my life today.  I can answer but I refuse".  Counselor and group members encouraged Susan [facility] to open up about her concerns and what would have improved quality of session.  Susan Ball could be seen briefly in the virtual window, but she would not respond, and shook her head.  Susan Ball also contradicted her previous statement about having a low signal by later typing "I had no tech issues".  Counselor offered to speak privately with Susan Ball after group, but she declined, and remained unengaged for rest of session.     Counselor ended session by acknowledging a graduating group member by prompting the member to reflect on progress made, takeaways from treatment and plan for stepping down. Counselor and group members shared observations of growth, encouragement and support as she transitions out of the program.  Susan Ball did not share during this completion  discussion.  Counselor informed case Freight forwarder and nurse of Susan Ball's presentation during session after group concluded.       Assessment and Plan: Counselor recommends that patient remains in IOP treatment to  better manage mental health symptoms and continue to address treatment plan goals. Counselor recommends adherence to crisis/safety plan, taking medications as prescribed and following up with medical professionals if any issues arise.    Follow Up Instructions: Counselor will send Webex link for next session.  The patient was advised to call back or seek an in-person evaluation if the symptoms worsen or if the condition fails to improve as anticipated.   I provided 180 minutes of non-face-to-face time during this encounter.     Shade Flood, LCSW, LCAS

## 2019-03-27 ENCOUNTER — Other Ambulatory Visit: Payer: Self-pay

## 2019-03-27 ENCOUNTER — Other Ambulatory Visit (HOSPITAL_COMMUNITY): Payer: Medicare Other | Admitting: Family

## 2019-03-27 DIAGNOSIS — F331 Major depressive disorder, recurrent, moderate: Secondary | ICD-10-CM | POA: Insufficient documentation

## 2019-03-27 NOTE — Progress Notes (Signed)
Virtual Visit via Video Note  I connected with Susan Ball on 03/27/19 at  9:00 AM EST by a video enabled telemedicine application and verified that I am speaking with the correct person using two identifiers.   I discussed the limitations of evaluation and management by telemedicine and the availability of in person appointments. The patient expressed understanding and agreed to proceed.  Location: Patient: Patient Home Provider: Home Office  Case Manger discussed the limitations of evaluation and management by telemedicine and the availability of in person appointments during orientation. The patient expressed understanding and agreed to proceed.  History of Present Illness: MDD   Observations/Objective: Case Manager checked in with all participants to review discharge dates, insurance authorizations, work-related documents and needs for the treatment team. Counselor introduced guest speaker, Susan Ball, [employer] Educator, to facilitate a discussion around Grief and Loss topics. Patient participated in discussion and shared insights about their own needs regarding this topic. Counselor allowed time for reflection and to journal on the topic of grief/loss and promoted continuing this work in individual therapy and local support groups.    Counselor facilitated a check in with group members to gage mood and current functioning as well as their takeaways from the presentation. Susan Ball notes processing and reflecting on her grief and loss issues in this way has been helpful in how she looks at healing and addressing ongoing loss issues. Pt identified metaphors as a good way for her to communicate with self and others on what she is feeling/experiencing/thinking. Susan Ball identified with a lion catching its prey and how the interaction isn't done until the lion eats its prey or until the prey escapes. Susan Ball reports severe anxiety and depression.   Counselor introduced Doctor, hospital, Susan Ball, Yoga Instructor, to guide the group in a yoga practice to promote mind and body connection, mindfulness and relaxation. Counselor checked in with all participants to assess the benefits.    Assessment and Plan: Counselor recommends that patient remains in IOP treatment to better manage mental health symptoms and continue to address treatment plan goals. Counselor recommends adherence to crisis/safety plan, taking medications as prescribed and following up with medical professionals if any issues arise.   Follow Up Instructions: Counselor will send Webex link for next session.    The patient was advised to call back or seek an in-person evaluation if the symptoms worsen or if the condition fails to improve as anticipated.   I provided 180 minutes of non-face-to-face time during this encounter.   Bardia Wangerin Lauro Franklin, LCMHCA, LCASA

## 2019-03-28 ENCOUNTER — Telehealth (HOSPITAL_COMMUNITY): Payer: Self-pay | Admitting: Psychiatry

## 2019-03-28 ENCOUNTER — Other Ambulatory Visit: Payer: Self-pay

## 2019-03-28 ENCOUNTER — Other Ambulatory Visit (HOSPITAL_COMMUNITY): Payer: Medicare Other | Admitting: Psychiatry

## 2019-03-31 ENCOUNTER — Encounter (HOSPITAL_COMMUNITY): Payer: Self-pay | Admitting: Family

## 2019-03-31 NOTE — Progress Notes (Signed)
Virtual Visit via Telephone Note  I connected with [name] on 04/01/19 at  9:00 AM EST by telephone and verified that I am speaking with the correct person using two identifiers.   I discussed the limitations, risks, security and privacy concerns of performing an evaluation and management service by telephone and the availability of in person appointments. I also discussed with the patient that there may be a patient responsible charge related to this service. The patient expressed understanding and agreed to proceed.  I discussed the assessment and treatment plan with the patient. The patient was provided an opportunity to ask questions and all were answered. The patient agreed with the plan and demonstrated an understanding of the instructions.   The patient was advised to call back or seek an in-person evaluation if the symptoms worsen or if the condition fails to improve as anticipated.  I provided 15 minutes of non-face-to-face time during this encounter.   [name], NP   [facility]ensive [facility] Program Discharge Summary  GATHA MCNULTY [id number]  Admission date: 03/12/2019 Discharge date: 04/01/2019  Reason for admission: Per admission assessment note: Tabrina Esty 49 year old African-American female was evaluated telephonically.  She reports previous history with mental health illness.  Reports current diagnoses with depression, bipolar and borderline personality disorder.  Reports she is currently followed by [facility] however states she is unable to attend due to her insurance.  Reports she is prescribed Prozac 40 mg, lithium 900 mg, BuSpar 15 mg and clonidine 0.5 mg.  She reports taking and tolerating medications well.  States she has been followed by a therapist for the past 10 years.  Reported [facility] inpatient [facility] for attempted suicide.  States she attempted to overdose 2 years ago.  Which was her last  inpatient admission   Progress in Program Toward Treatment Goals: Ongoing, patient attended and participated with daily group session with active and engaged participation.  Denying suicidal or homicidal ideations during this assessment.  Rates her depression 5 out of 10 with 10 being the worst.  Reports overall the program has helped with coping skills.  Patient did not elaborate with improved symptoms due to being assessed while working.  Patient to keep all follow-up appointments.  Progress (rationale): Patient to keep follow-up with [name] LCSW on 04/09/2019@ 10am and Dr. Lolly Mustache 04/11/2019 @ 09:00  Take all medications as prescribed. Keep all follow-up appointments as scheduled.  Do not consume alcohol or use illegal drugs while on prescription medications. Report any adverse effects from your medications to your primary care provider promptly.  In the event of recurrent symptoms or worsening symptoms, call 911, a crisis hotline, or go to the nearest emergency department for evaluation.   [name], NP 03/31/2019

## 2019-04-01 ENCOUNTER — Other Ambulatory Visit (HOSPITAL_COMMUNITY): Payer: Medicare Other | Admitting: Professional

## 2019-04-01 ENCOUNTER — Other Ambulatory Visit: Payer: Self-pay

## 2019-04-01 DIAGNOSIS — F331 Major depressive disorder, recurrent, moderate: Secondary | ICD-10-CM

## 2019-04-01 NOTE — Patient Instructions (Signed)
D:  Patient completed MH-IOP today.  A:  Discharge today.  Follow up with [name], LCSW on 04-09-19 @ 10 a.m and Dr. Lolly Mustache on 04-11-19 @ 9a.m.  Encouraged support groups.  Recommended the anger group at [facility] [phone].  R: Patient receptive.

## 2019-04-01 NOTE — Progress Notes (Signed)
Virtual Visit via Video Note  I connected with Susan Ball on @TODAY @ at 0800 by a video enabled telemedicine application and verified that I am speaking with the correct person using two identifiers.  I discussed the limitations of evaluation and management by telemedicine and the availability of in person appointments. The patient expressed understanding and agreed to proceed.  I discussed the assessment and treatment plan with the patient. The patient was provided an opportunity to ask questions and all were answered. The patient agreed with the plan and demonstrated an understanding of the instructions.   The patient was advised to call back or seek an in-person evaluation if the symptoms worsen or if the condition fails to improve as anticipated.  I provided 20 minutes of non-face-to-face time during this encounter.   Patient ID: Susan Ball, female   DOB: [birth year], 49 y.o.   MRN: [id number] As per previous [facility] note: This is a 49 yr old, married, employed, 42 American female who was referred per [facility] (no longer accept pt's insurance), treatment for worsening depressive, anxiety symptoms, anger outbursts with passive SI.  According to pt, she has had sx's for yrs but they started to worsen 10 yrs ago and just recently.  "I was diagnosed Borderline Disorder and Bipolar D/O ten years ago, but I've noticed that I've been having this anger to the point of lashing out verbally and physically."  Pt states the SI is off and on depending on her mood.  Discussed higher of level options, but pt declined, stating she wants MH-IOP.  Discussed safety options at length.  Pt is able to contract for safety.  Denies HI or A/V hallucinations.  Stressors:  1) Marriage of three years.  2)  Job of three years.  Pt is a Susan Ball.  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  States the job has been difficult since pandemic.  3)  No support system.  Pt admits to two prior inpatient psychiatric admissions (ie.  6 yrs ago at [facility] and 5 yrs ago [facility]).  States both admits were due to suicide attempts (ie. jumping from car and OD).  "You name it I've done it.  I've had so many attempts in the past."  Pt has been with Susan Ball for ~ five yrs.  Was seeing Dr. [facility] Ball and Hyacinth Meeker, LCSW.  Family hx:  Siblings (Depression). Patients Currently Reported Symptoms/Problems: Sadness, anxious, isolative, anhedonia, no motivation, poor energy, tearful, irritable, poor sleep, ruminating thoughts, anger outbursts  Pt completed MH-IOP today.  She attended 10 days (virtual).  Reports overall feeling better (ie. Less anxious).  States she continues to struggle with anger issues and on and off passive SI.  Currently denies any SI.  Also denies HI or A/V hallucinations.  "Groups were a big help.  I did learn some skills." On a scale of 1-10 (10 being the worst); pt rated anxiety a 4 and depression a 6. A:  D/C today.  F/U with Susan Ball on 04-09-19 @ 10 a.m and Dr. 09-05-1988 on 04-11-19 @ 9a.m.  Strongly recommended the anger group at [facility].  Pt probably could benefit from DBT also.  Continue working prn as she has been doing.  R:  Pt receptive.   11-03-2005, M.Ed,CNA

## 2019-04-09 ENCOUNTER — Other Ambulatory Visit: Payer: Self-pay

## 2019-04-09 ENCOUNTER — Encounter (HOSPITAL_COMMUNITY): Payer: Self-pay | Admitting: Psychiatry

## 2019-04-09 ENCOUNTER — Ambulatory Visit (INDEPENDENT_AMBULATORY_CARE_PROVIDER_SITE_OTHER): Payer: Medicare Other | Admitting: Psychiatry

## 2019-04-09 DIAGNOSIS — F331 Major depressive disorder, recurrent, moderate: Secondary | ICD-10-CM | POA: Diagnosis not present
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
## 2019-04-11 ENCOUNTER — Other Ambulatory Visit: Payer: Self-pay

## 2019-04-11 ENCOUNTER — Ambulatory Visit (INDEPENDENT_AMBULATORY_CARE_PROVIDER_SITE_OTHER): Payer: Medicare Other | Admitting: Psychiatry

## 2019-04-11 ENCOUNTER — Telehealth (HOSPITAL_COMMUNITY): Payer: Self-pay | Admitting: *Deleted

## 2019-04-11 ENCOUNTER — Encounter (HOSPITAL_COMMUNITY): Payer: Self-pay | Admitting: Psychiatry

## 2019-04-11 ENCOUNTER — Other Ambulatory Visit (HOSPITAL_COMMUNITY): Payer: Self-pay | Admitting: Psychiatry

## 2019-04-11 DIAGNOSIS — F603 Borderline personality disorder: Secondary | ICD-10-CM | POA: Diagnosis not present

## 2019-04-11 DIAGNOSIS — Z79899 Other long term (current) drug therapy: Secondary | ICD-10-CM

## 2019-04-11 DIAGNOSIS — F319 Bipolar disorder, unspecified: Secondary | ICD-10-CM | POA: Diagnosis not present

## 2019-04-11 NOTE — Telephone Encounter (Signed)
Writer left VM for pt instructing her to have labs drawn, Lithium level, TSH, CBC with DIFF, HgbA1c, and CMP with GFR, at [facility] @ [address], [place]. Writer left both address and phone number for lab. Pt encouraged to call clinic back with any questions or concerns.
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
## 2019-04-12 LAB — LITHIUM LEVEL: Lithium Lvl: 0.7 mmol/L (ref 0.6–1.2)

## 2019-04-12 LAB — COMPREHENSIVE METABOLIC PANEL
ALT: 12 IU/L (ref 0–32)
AST: 19 IU/L (ref 0–40)
Albumin/Globulin Ratio: 1.7 (ref 1.2–2.2)
Albumin: 4 g/dL (ref 3.8–4.9)
Alkaline Phosphatase: 73 IU/L (ref 39–117)
BUN/Creatinine Ratio: 8 — ABNORMAL LOW (ref 9–23)
BUN: 7 mg/dL (ref 6–24)
Bilirubin Total: 0.3 mg/dL (ref 0.0–1.2)
CO2: 23 mmol/L (ref 20–29)
Calcium: 9.5 mg/dL (ref 8.7–10.2)
Chloride: 105 mmol/L (ref 96–106)
Creatinine, Ser: 0.91 mg/dL (ref 0.57–1.00)
GFR calc Af Amer: 84 mL/min/{1.73_m2} (ref >59)
GFR calc non Af Amer: 73 mL/min/{1.73_m2} (ref >59)
Globulin, Total: 2.4 g/dL (ref 1.5–4.5)
Glucose: 73 mg/dL (ref 65–99)
Potassium: 4.4 mmol/L (ref 3.5–5.2)
Sodium: 140 mmol/L (ref 134–144)
Total Protein: 6.4 g/dL (ref 6.0–8.5)

## 2019-04-12 LAB — CBC/DIFF AMBIGUOUS DEFAULT
Basophils Absolute: 0.1 10*3/uL (ref 0.0–0.2)
Basos: 2 %
EOS (ABSOLUTE): 0.1 10*3/uL (ref 0.0–0.4)
Eos: 2 %
Hematocrit: 35.8 % (ref 34.0–46.6)
Hemoglobin: 10.6 g/dL — ABNORMAL LOW (ref 11.1–15.9)
Immature Grans (Abs): 0 10*3/uL (ref 0.0–0.1)
Immature Granulocytes: 0 %
Lymphocytes Absolute: 2.4 10*3/uL (ref 0.7–3.1)
Lymphs: 40 %
MCH: 22.9 pg — ABNORMAL LOW (ref 26.6–33.0)
MCHC: 29.6 g/dL — ABNORMAL LOW (ref 31.5–35.7)
MCV: 78 fL — ABNORMAL LOW (ref 79–97)
Monocytes Absolute: 0.5 10*3/uL (ref 0.1–0.9)
Monocytes: 9 %
Neutrophils Absolute: 2.9 10*3/uL (ref 1.4–7.0)
Neutrophils: 47 %
Platelets: 346 10*3/uL (ref 150–450)
RBC: 4.62 x10E6/uL (ref 3.77–5.28)
RDW: 14.9 % (ref 11.7–15.4)
WBC: 6 10*3/uL (ref 3.4–10.8)

## 2019-04-12 LAB — TSH: TSH: 2.51 u[IU]/mL (ref 0.450–4.500)

## 2019-04-12 LAB — SPECIMEN STATUS REPORT

## 2019-04-12 LAB — HGB A1C W/O EAG: Hgb A1c MFr Bld: 5 % (ref 4.8–5.6)

## 2019-04-21 NOTE — Progress Notes (Signed)
Virtual Visit via Video Note  I connected with Susan Ball on 04/01/19 at  9:00 AM EST by a video enabled telemedicine application and verified that I am speaking with the correct person using two identifiers.   I discussed the limitations of evaluation and management by telemedicine and the availability of in person appointments. The patient expressed understanding and agreed to proceed.  Location: Patient: Patient Home Provider: Home Office   Case Manger discussed the limitations of evaluation and management by telemedicine and the availability of in person appointments during orientation. The patient expressed understanding and agreed to proceed.   History of Present Illness: MDD    Observations/Objective: Case Manager checked in with all participants to review discharge dates, insurance authorizations, work-related documents and needs for the treatment team. Counselor processed current mood and functioning and discussed how participants spent their time since last session and if skills were applied. Pt reports she spent the evening with her husband and was able to "leave the work stuff at the door." Pt reports there was "drama" at work yesterday "and I didn't let it affect me." Pt is able to process. Cln encourages pt to identify her role and ability to manage her emotions around the "drama" and allowing herself to enjoy time with her husband instead of dwelling. Pt presents with moderate depression and mild anxiety.   Counselor provided psychoeducation on SMART GOALS and needs/self-care. Clinician led the group in discussing the self-care/needs assessment. Patients identified and discussed areas of life where improvements need to be made. Pt identified needs in areas related to professional (maintaining a work/life balance) and physical/emotional health. Pt reports she will identify her top 3 areas of need and complete SMART GOALs to meet the needs. Counselor conducted check-out with  patients. Pt reports she has afternoon plans of spending time with her husband and going grocery shopping. Pt is discharging from IOP today and reports feeling good about stepping down to individual counseling and psychiatry.    Assessment and Plan: Counselor recommends that patient discharge from IOP and continue counseling in individual setting. Counselor recommends adherence to crisis/safety plan, taking medications as prescribed and following up with medical professionals if any issues arise.    Follow Up Instructions: The patient was advised to call back or seek an in-person evaluation if the symptoms worsen or if the condition fails to improve as anticipated.   I provided 180 minutes of non-face-to-face time during this encounter.   Quinn Axe, [facility]

## 2019-04-24 ENCOUNTER — Ambulatory Visit: Payer: Medicaid Other | Attending: Internal Medicine

## 2019-04-24 DIAGNOSIS — Z23 Encounter for immunization: Secondary | ICD-10-CM

## 2019-04-24 NOTE — Progress Notes (Signed)
   Covid-19 Vaccination Clinic  Name:  Susan Ball    MRN: [id number] DOB: [birth year]  04/24/2019  Susan Ball was observed post Covid-19 immunization for 15 minutes without incident. She was provided with Vaccine Information Sheet and instruction to access the V-Safe system.   Susan Ball was instructed to call 911 with any severe reactions post vaccine: Marland Kitchen Difficulty breathing  . Swelling of face and throat  . A fast heartbeat  . A bad rash all over body  . Dizziness and weakness   Immunizations Administered    Name Date Dose VIS Date Route   Pfizer COVID-19 Vaccine 04/24/2019  9:56 AM 0.3 mL 01/03/2019 Intramuscular   Manufacturer: [employer], [employer]   Lot: [id number]   NDC: [address]-8

## 2019-05-07 ENCOUNTER — Ambulatory Visit (INDEPENDENT_AMBULATORY_CARE_PROVIDER_SITE_OTHER): Payer: Medicare Other | Admitting: Psychiatry

## 2019-05-07 ENCOUNTER — Encounter (HOSPITAL_COMMUNITY): Payer: Self-pay | Admitting: Psychiatry

## 2019-05-07 ENCOUNTER — Other Ambulatory Visit: Payer: Self-pay

## 2019-05-07 DIAGNOSIS — F419 Anxiety disorder, unspecified: Secondary | ICD-10-CM

## 2019-05-07 DIAGNOSIS — F603 Borderline personality disorder: Secondary | ICD-10-CM

## 2019-05-07 DIAGNOSIS — F319 Bipolar disorder, unspecified: Secondary | ICD-10-CM

## 2019-05-07 MED ORDER — FLUOXETINE HCL 40 MG PO CAPS: 40.0000 mg | ORAL_CAPSULE | Freq: Every day | ORAL | 0 refills | Status: DC

## 2019-05-07 MED ORDER — LITHIUM CARBONATE ER 450 MG PO TBCR: EXTENDED_RELEASE_TABLET | ORAL | 0 refills | Status: DC

## 2019-05-07 MED ORDER — CLONIDINE HCL 0.1 MG PO TABS: 0.1000 mg | ORAL_TABLET | Freq: Every day | ORAL | 0 refills | Status: DC

## 2019-05-07 NOTE — Progress Notes (Signed)
Virtual Visit via Telephone Note  I connected with Susan Ball on 05/07/19 at  2:20 PM EDT by telephone and verified that I am speaking with the correct person using two identifiers.   I discussed the limitations, risks, security and privacy concerns of performing an evaluation and management service by telephone and the availability of in person appointments. I also discussed with the patient that there may be a patient responsible charge related to this service. The patient expressed understanding and agreed to proceed.   History of Present Illness: Patient is 49 year old African-American married employed female who is seen first time 4 weeks ago.  She is referred from IOP.  She was seen at [facility] but her insurance does not cover the services.  She is taking lithium, Prozac and clonidine.  Today she is upset because she did not know it is a virtual visit and she came all the way from [place] to [place] office.  Patient told her my chart did not mention it was a virtual visit.  So far she is taking medication and reported no major side effects.  She is in therapy with Maurice Small.  She still struggles with her mood, irritability and anger but medicine had helped her a lot.  She had blood work which was recommended on the last visit.  Her lithium level is 0.7.  Her BUN/creatinine is normal.  She has anemia and I recommend that she should consult PCP to address low hemoglobin.  She agreed with the plan.  She is working as a Corporate treasurer.  She reported her relationship with her husband is somewhat better as he is more supportive.  Patient told the car which husband has a correct is still not fixed but in process.  She is getting along better with her husband.  She still have trouble sleeping but at this time does not want to add any more medication.  She used to take BuSpar but she is discontinued because she felt it is making her more sad.  She endorsed having anxiety in some time panic attacks.  She  denies any recent self harming behavior.  She has chronic issue with trust.  She like to continue her current medication.    Past Psychiatric History: H/O Bipolar, anxiety and borderline traits. Taking meds since age 57.  H/O multiple inpatient and suicidal attempt.  H/O overdose, jump from the car and banging head.  Tried Lamictal (rash), Seroquel, Abilify, doxepin, Trazadone and hydroxyzine but did not work.  H/O n/c with meds, refusing to eat and needed PICC line. H/O anger, road rage, paranoia and trust issues. Had DBT with good response.    Recent Results (from the past 2160 hour(s))  CBC/Diff Ambiguous Default     Status: Abnormal   Collection Time: 04/11/19  1:58 PM  Result Value Ref Range   WBC 6.0 3.4 - 10.8 x10E3/uL   RBC 4.62 3.77 - 5.28 x10E6/uL   Hemoglobin 10.6 (L) 11.1 - 15.9 g/dL   Hematocrit 35.8 34.0 - 46.6 %   MCV 78 (L) 79 - 97 fL   MCH 22.9 (L) 26.6 - 33.0 pg   MCHC 29.6 (L) 31.5 - 35.7 g/dL   RDW 14.9 11.7 - 15.4 %   Platelets 346 150 - 450 x10E3/uL   Neutrophils 47 Not Estab. %   Lymphs 40 Not Estab. %   Monocytes 9 Not Estab. %   Eos 2 Not Estab. %   Basos 2 Not Estab. %   Neutrophils Absolute  2.9 1.4 - 7.0 x10E3/uL   Lymphocytes Absolute 2.4 0.7 - 3.1 x10E3/uL   Monocytes Absolute 0.5 0.1 - 0.9 x10E3/uL   EOS (ABSOLUTE) 0.1 0.0 - 0.4 x10E3/uL   Basophils Absolute 0.1 0.0 - 0.2 x10E3/uL   Immature Granulocytes 0 Not Estab. %   Immature Grans (Abs) 0.0 0.0 - 0.1 x10E3/uL    Comment: A hand-written panel/profile was received from your office. In accordance with the [facility] Ambiguous Test Code Policy dated July 2003, we have assigned CBC with Differential/Platelet, Test Code #005009 to this request. If this is not the testing you wished to receive on this specimen, please contact the [facility] Department to clarify the test order. We appreciate your business.   Comprehensive metabolic panel     Status: Abnormal    Collection Time: 04/11/19  1:58 PM  Result Value Ref Range   Glucose 73 65 - 99 mg/dL   BUN 7 6 - 24 mg/dL   Creatinine, Ser 0.27 0.57 - 1.00 mg/dL   GFR calc non Af Amer 73 >59 mL/min/1.73   GFR calc Af Amer 84 >59 mL/min/1.73   BUN/Creatinine Ratio 8 (L) 9 - 23   Sodium 140 134 - 144 mmol/L   Potassium 4.4 3.5 - 5.2 mmol/L   Chloride 105 96 - 106 mmol/L   CO2 23 20 - 29 mmol/L   Calcium 9.5 8.7 - 10.2 mg/dL   Total Protein 6.4 6.0 - 8.5 g/dL   Albumin 4.0 3.8 - 4.9 g/dL   Globulin, Total 2.4 1.5 - 4.5 g/dL   Albumin/Globulin Ratio 1.7 1.2 - 2.2   Bilirubin Total 0.3 0.0 - 1.2 mg/dL   Alkaline Phosphatase 73 39 - 117 IU/L   AST 19 0 - 40 IU/L   ALT 12 0 - 32 IU/L  Hgb A1c w/o eAG     Status: None   Collection Time: 04/11/19  1:58 PM  Result Value Ref Range   Hgb A1c MFr Bld 5.0 4.8 - 5.6 %    Comment:          Prediabetes: 5.7 - 6.4          Diabetes: >6.4          Glycemic control for adults with diabetes: <7.0   TSH     Status: None   Collection Time: 04/11/19  1:58 PM  Result Value Ref Range   TSH 2.510 0.450 - 4.500 uIU/mL  Lithium level     Status: None   Collection Time: 04/11/19  1:58 PM  Result Value Ref Range   Lithium Lvl 0.7 0.6 - 1.2 mmol/L    Comment:                                  Detection Limit = 0.1                           <0.1 indicates None Detected   Specimen status report     Status: None   Collection Time: 04/11/19  1:58 PM  Result Value Ref Range   specimen status report Comment     Comment: [facility] [place] CMP14 Default Ambig Abbrev CMP14 Default A hand-written panel/profile was received from your office. In accordance with the [facility] Ambiguous Test Code Policy dated July 2003, we have completed your order by using the closest currently or formerly recognized AMA  panel.  We have assigned Comprehensive Metabolic Panel (14), Test Code #322000 to this request.  If this is not the testing you wished to receive on this specimen, please contact  the [facility] Client Inquiry/Technical Services Department to clarify the test order.  We appreciate your business.     Psychiatric Specialty Exam: Physical Exam  Review of Systems  There were no vitals taken for this visit.There is no height or weight on file to calculate BMI.  General Appearance: NA  Eye Contact:  NA  Speech:  Slow  Volume:  Normal  Mood:  Irritable  Affect:  NA  Thought Process:  Descriptions of Associations: Intact  Orientation:  Full (Time, Place, and Person)  Thought Content:  Rumination  Suicidal Thoughts:  No  Homicidal Thoughts:  No  Memory:  Immediate;   Fair Recent;   Fair Remote;   Good  Judgement:  Intact  Insight:  Present  Psychomotor Activity:  NA  Concentration:  Concentration: Fair and Attention Span: Fair  Recall:  Good  Fund of Knowledge:  Good  Language:  Good  Akathisia:  No  Handed:  Right  AIMS (if indicated):     Assets:  Communication Skills Desire for Improvement Housing Resilience  ADL's:  Intact  Cognition:  WNL  Sleep:   fair     Assessment and Plan: Bipolar disorder type I.  Anxiety.  Borderline traits.  I apologize having the wrong message on the my chart.  I recommend she should call us in the future to have a clarification about nature of the visit.  I also recommend that she can try lithium 450 mg twice a day to help her better sleep.  She will continue to get 900 mg lithium but in divided doses.  I also reviewed blood work results with her.  Recommend to see her PCP to address low hemoglobin.  Encouraged to continue therapy with [name].  Continue Prozac 40 mg daily and clonidine 0.1 mg at bedtime.  Recommended to call us back if she has any question or any concern.  Follow-up in 2 months.  If she like lithium to take twice a day then she will call us to get more refills.  She want to try for 1 month and agreed to give [name] a call for future refills if needed.  Follow Up Instructions:    I discussed the assessment  and treatment plan with the patient. The patient was provided an opportunity to ask questions and all were answered. The patient agreed with the plan and demonstrated an understanding of the instructions.   The patient was advised to call back or seek an in-person evaluation if the symptoms worsen or if the condition fails to improve as anticipated.  I provided 20 minutes of non-face-to-face time during this encounter.   Cleotis Nipper, MD

## 2019-05-08 ENCOUNTER — Telehealth (HOSPITAL_COMMUNITY): Payer: Self-pay

## 2019-05-08 NOTE — Telephone Encounter (Signed)
Pt called stating she received the "wrong dose" of her Prozac prescription. Pt reports she had been taking four 20mg  tabs, total of 80mg  per day. Pt was ordered 40mg  daily. Please review and advise.

## 2019-05-08 NOTE — Telephone Encounter (Signed)
This is a very high dose.  She should not be taking 80 mg.  We can try 60 mg and if she agree please call 20 mg take 3 pills a day.

## 2019-05-08 NOTE — Telephone Encounter (Signed)
Spoke with pharmacist at [facility]. Pt was prescribed total of 80mg /day by most recently in February 2021.

## 2019-05-08 NOTE — Telephone Encounter (Signed)
Please call her pharmacy because she was getting Prozac from her primary care physician.  80 mg Prozac is very high and she should not be taking 80 mg.  We need to clarify the dose.

## 2019-05-09 ENCOUNTER — Telehealth (HOSPITAL_COMMUNITY): Payer: Self-pay

## 2019-05-09 NOTE — Telephone Encounter (Signed)
I returned patient's phone call.  She told that her PCP has given Prozac 80 mg and she is been taking for a while.  I explained 80 mg is very high dose and do not see any benefit taking such a high dose and given the diagnosis of bipolar disorder it may be making her more irritable.  She does not want to drop Prozac from 80 mg to 40 mg but willing to try 60 mg for now.  I agree.  I recommend to pick up her 40 mg of Prozac and she has 20 mg remaining and she can take 60 mg.  If 60 mg does not help then we will consider adjusting the medication.  I also reminded we recently readjusted lithium so that may help her mood.

## 2019-05-09 NOTE — Telephone Encounter (Signed)
Spoke with patient regarding her Fluoxetine 40mg . I relayed the message from doctor stating 80mg  is too high, can do 60mg . Patient stated that she's been taking 80mg  for at least 5 years. She would like a call back at [phone]. Thank you.

## 2019-05-14 ENCOUNTER — Telehealth (INDEPENDENT_AMBULATORY_CARE_PROVIDER_SITE_OTHER): Payer: Medicare Other | Admitting: Psychiatry

## 2019-05-14 ENCOUNTER — Other Ambulatory Visit: Payer: Self-pay

## 2019-05-14 DIAGNOSIS — F319 Bipolar disorder, unspecified: Secondary | ICD-10-CM

## 2019-05-14 NOTE — Progress Notes (Signed)
Virtual Visit via Video Note  I connected with Susan Ball on 05/14/19 at 10:00 AM EDT by a video enabled telemedicine application and verified that I am speaking with the correct person using two identifiers.   I discussed the limitations of evaluation and management by telemedicine and the availability of in person appointments. The patient expressed understanding and agreed to proceed.  I provided 50 minutes of non-face-to-face time during this encounter.   [name], LCSW    THERAPIST PROGRESS NOTE  Session Time: Wednesday 10:15 AM - 11:05 AM   Participation Level: Active  Behavioral Response: CasualAlertAnxious and Depressed  Type of Therapy: Individual Therapy  Treatment Goals addressed: Alleviate symptoms of depression/resume normal interest in activities and socialization  Interventions: CBT and Supportive  Summary: Susan Ball is a 49 y.o. female who p is referred for services by [name]. She just completed IOP on March 27, 2019. She has long standing history of bipolar disorder and borderline personality disorder. She has had 3 psychiatric hospitalizations. The last was in 2010 at [facility] due to suicide attempt. She participated in outpatient therapy intermittently at [facility] for 3 years. She last was seen there 6 months ago.  Current symptoms include depressed mood, crying spells, mood swings, aggression, anger, excessive worry, sleep difficulty, poor concentration, and memory difficulty.  Patient was seen via virtual visit for assessment appointment about 4 weeks ago.  She reports little to no change in symptoms.  She continues to experience poor motivation, diminished interest and involvement in activities, sleep difficulty, poor concentration, and memory difficulty along with irritability.  Patient states being in the house most of the time except for going to work.  She reports little socialization and says mother lives next door but not visiting mother  frequently.  However, she does go to the gym 3 to 4 days a week and this has been beneficial per her report.  She still reports irritability and becoming angry easily and cites examples of getting frustrated with people in the gym or people at [place].  She reports recent stress and worry about son's work/education situation.  She presses anger and ruminating thoughts about program son was involved in earlier this year.  Suicidal/Homicidal: Nowithout intent/plan  Therapist Response: Established rapport, reviewed symptoms, praised and reinforced patient's efforts to go to the gym and exercise, discussed stressors, facilitated expression of thoughts and feelings, validated feelings, developed treatment plan, obtained patient's permission to initial plan as this was a virtual visit, discussed rationale for and assisted patient practice deep breathing to trigger relaxation response to help cope with stress and anxiety, developed plan with patient to practice deep breathing 5 to 10 minutes 2 times per day  Plan: Return again in 2  weeks.  Diagnosis: Axis I: Bipolar Disorder    Axis II: Borderline Personality Dis.    [name], LCSW 05/14/2019

## 2019-05-19 ENCOUNTER — Ambulatory Visit: Payer: Self-pay

## 2019-05-20 ENCOUNTER — Ambulatory Visit: Payer: Medicare Other | Attending: Internal Medicine

## 2019-05-20 DIAGNOSIS — Z23 Encounter for immunization: Secondary | ICD-10-CM

## 2019-05-20 NOTE — Progress Notes (Signed)
   Covid-19 Vaccination Clinic  Name:  TANEEKA CURTNER    MRN: [id number] DOB: 1970/11/14  05/20/2019  Ms. Fasig was observed post Covid-19 immunization for 15 minutes without incident. She was provided with Vaccine Information Sheet and instruction to access the V-Safe system.   Ms. Brott was instructed to call 911 with any severe reactions post vaccine: Marland Kitchen Difficulty breathing  . Swelling of face and throat  . A fast heartbeat  . A bad rash all over body  . Dizziness and weakness   Immunizations Administered    Name Date Dose VIS Date Route   Pfizer COVID-19 Vaccine 05/20/2019  8:24 AM 0.3 mL 03/19/2018 Intramuscular   Manufacturer: [employer], [employer]   Lot: [id number]   NDC: [address]-0

## 2019-05-26 ENCOUNTER — Encounter (INDEPENDENT_AMBULATORY_CARE_PROVIDER_SITE_OTHER): Payer: Self-pay

## 2019-05-28 ENCOUNTER — Ambulatory Visit (INDEPENDENT_AMBULATORY_CARE_PROVIDER_SITE_OTHER): Payer: Medicare Other | Admitting: Psychiatry

## 2019-05-28 ENCOUNTER — Other Ambulatory Visit: Payer: Self-pay

## 2019-05-28 DIAGNOSIS — F319 Bipolar disorder, unspecified: Secondary | ICD-10-CM

## 2019-05-28 NOTE — Progress Notes (Signed)
Virtual Visit via Video Note  I connected with Susan Ball on 05/28/19 at 10:05 AM EDT by a video enabled telemedicine application and verified that I am speaking with the correct person using two identifiers.   I discussed the limitations of evaluation and management by telemedicine and the availability of in person appointments. The patient expressed understanding and agreed to proceed.    I provided 10:55 AM  minutes of non-face-to-face time during this encounter.   [name], LCSW     THERAPIST PROGRESS NOTE  Session Time:  05/28/2019 10:05 AM - 10:55 AM    Participation Level: Active  Behavioral Response: CasualAlertAnxious and Depressed/teaful at times  Type of Therapy: Individual Therapy  Treatment Goals addressed: Alleviate symptoms of depression/resume normal interest in activities and socialization  Interventions: CBT and Supportive  Summary: Susan Ball is a 49 y.o. female who p is referred for services by [name]. She just completed IOP on March 27, 2019. She has long standing history of bipolar disorder and borderline personality disorder. She has had 3 psychiatric hospitalizations. The last was in 2010 at [facility] due to suicide attempt. She participated in outpatient therapy intermittently at [facility] for 3 years. She last was seen there 6 months ago.  Current symptoms include depressed mood, crying spells, mood swings, aggression, anger, excessive worry, sleep difficulty, poor concentration, and memory difficulty.  Patient was seen via virtual visit about 2 weeks ago.  She continues to experience moderate depression and anxiety.  Symptoms include poor motivation, diminished interest and involvement in activities, sleep difficulty, poor concentration, and memory difficulty along with irritability.  She also reports increased panic attacks and reports having a panic attack when she went to get her second COVID-19 vaccine.  She also reports becoming very  stressed and anxious when she had to cancel her surgery on the day of the surgery due to unclear communication about co-pay and arrangements.  She is pleased she did not respond in an verbally aggressive way like she normally would have done but reports she does not know why she responded differently.  She continues to work and is still going to the gym regularly.  She also reports visiting her mother for about an hour at her home.  However, patient expresses frustration with self that she does not feel like going out and being involved with others like she used to do.  She reports value of family and expresses sadness she is not involved with her sisters like she was in the past.  She wants to have a closer relationship but is fearful she will be rejected if she reaches out to sisters as she states she abandoned them due to her issues.    Suicidal/Homicidal: Nowithout intent/plan  Therapist Response:  reviewed symptoms, praised and reinforced patient's efforts to go to the gym and exercise, discussed stressors, facilitated expression of thoughts and feelings, validated feelings, assisted patient identify her values to try to increase value congruent behavior, processed patient's feelings about relationship with sisters, discussed effects on mood and began to explore ways to reestablish contact, explore ways to have more contact with mother to increase social involvement, also discussed performing small household tasks to increase behavioral activation, developed plan with patient to develop list of her interests in preparation for next session  Plan: Return again in 2  weeks.  Diagnosis: Axis I: Bipolar Disorder    Axis II: Borderline Personality Dis.    [name], LCSW 05/28/2019

## 2019-06-02 ENCOUNTER — Other Ambulatory Visit (HOSPITAL_COMMUNITY): Payer: Self-pay | Admitting: *Deleted

## 2019-06-02 DIAGNOSIS — F319 Bipolar disorder, unspecified: Secondary | ICD-10-CM

## 2019-06-02 DIAGNOSIS — F419 Anxiety disorder, unspecified: Secondary | ICD-10-CM

## 2019-06-02 MED ORDER — FLUOXETINE HCL 40 MG PO CAPS: 40.0000 mg | ORAL_CAPSULE | Freq: Every day | ORAL | 0 refills | Status: DC

## 2019-06-02 MED ORDER — FLUOXETINE HCL 20 MG PO CAPS: 20.0000 mg | ORAL_CAPSULE | Freq: Every day | ORAL | 0 refills | Status: DC

## 2019-06-04 ENCOUNTER — Other Ambulatory Visit (HOSPITAL_COMMUNITY): Payer: Self-pay | Admitting: *Deleted

## 2019-06-04 ENCOUNTER — Telehealth (HOSPITAL_COMMUNITY): Payer: Self-pay | Admitting: *Deleted

## 2019-06-04 MED ORDER — BUSPIRONE HCL 5 MG PO TABS: 5.0000 mg | ORAL_TABLET | Freq: Every day | ORAL | 0 refills | Status: DC

## 2019-06-04 NOTE — Telephone Encounter (Signed)
She feels having a lot of anxiety then she can try low-dose BuSpar 5 mg daily.  If agree please call her pharmacy BuSpar 5 mg daily.  We can also move her appointment sooner than July 20.

## 2019-06-04 NOTE — Telephone Encounter (Signed)
Writer returned pt irritated message from earlier today c/o "medication not working". Pt says the decrease of Prozac to 60 mg is not helping her. Pt describes having panic attacks, increased irritability and anger. Pt would like to increase back to 80 mg Prozac which has been explained that that dose ws too high for her, or change medication altogether. Please review and advise. Pt next appointment is 08/12/19 which she says fells it should be sooner due to med changes.

## 2019-06-04 NOTE — Progress Notes (Signed)
buspar

## 2019-06-11 ENCOUNTER — Other Ambulatory Visit: Payer: Self-pay

## 2019-06-11 ENCOUNTER — Ambulatory Visit (INDEPENDENT_AMBULATORY_CARE_PROVIDER_SITE_OTHER): Payer: Medicare Other | Admitting: Psychiatry

## 2019-06-11 DIAGNOSIS — F319 Bipolar disorder, unspecified: Secondary | ICD-10-CM

## 2019-06-11 NOTE — Progress Notes (Signed)
Virtual Visit via Video Note  I connected with Susan Ball on 06/11/19 at 10:20 AM EDTby a video enabled telemedicine application and verified that I am speaking with the correct person using two identifiers.   I discussed the limitations of evaluation and management by telemedicine and the availability of in person appointments. The patient expressed understanding and agreed to proceed.   I provided 35 minutes of non-face-to-face time during this encounter.   [name], LCSW     THERAPIST PROGRESS NOTE  Session Time:  06/11/2019 10:20 AM - 10:55 AM   Participation Level: Active  Behavioral Response: CasualAlert/ less depressed   Type of Therapy: Individual Therapy  Treatment Goals addressed: Alleviate symptoms of depression/resume normal interest in activities and socialization  Interventions: CBT and Supportive  Summary: Susan Ball is a 49 y.o. female who p is referred for services by [name]. She just completed IOP on March 27, 2019. She has long standing history of bipolar disorder and borderline personality disorder. She has had 3 psychiatric hospitalizations. The last was in 2010 at [facility] due to suicide attempt. She participated in outpatient therapy intermittently at [facility] for 3 years. She last was seen there 6 months ago.  Current symptoms include depressed mood, crying spells, mood swings, aggression, anger, excessive worry, sleep difficulty, poor concentration, and memory difficulty.  Patient was seen via virtual visit about 2 weeks ago.  She continues to experience moderate depression and anxiety but reports feeling less depressed. She expresses some frustration about her current medication regimen and reports efforts to try to address. She is scheduled to see psychiatrist Dr. Lolly Mustache in June and will address issues at that time. She reports she is taking Prozac and lithium. She also reports she is self-medicating but declined to say what she is using. She  is pleased with her efforts to increase social contact and reports attending a family celebration for Mother's Day, visiting her mother on another day, and reconnecting with a friend via telephone. She also reports seeing her sister at the family celebration and talking with the same sister via telephone on another day. She reports increased awareness of her thoughts and feelings regarding triggers of anger and cites examples of being able to pause and choose a more appropriate response. She reports still sometimes having anger outbursts and being verbally aggressive. Patient continues to work regularly and attend the gym regularly. She still reports some difficulty engaging in behavioral activation consistently beyond working and going to the gym.   Suicidal/Homicidal: Nowithout intent/plan  Therapist Response:  reviewed symptoms, praised and reinforced patient's efforts to increase social contact, discussed effects, praised and reinforced patient's increased awareness of thoughts and feelings along with her efforts to pause before responding, discussed effects, discussed ways to increase behavioral activation consistently to improve daily structure/routine, developed plan with patient to implement a daily schedule on the days she is not working (Tuesday Wednesday Thursday Saturday) to include completion of one responsibility and one pleasurable activity, record and bring to next session, also began to discuss bipolar disorder, will send patient handouts in preparation for next session    Plan: Return again in 2  weeks.  Diagnosis: Axis I: Bipolar Disorder    Axis II: Borderline Personality Dis.    [name], LCSW 06/11/2019

## 2019-06-20 ENCOUNTER — Other Ambulatory Visit (HOSPITAL_COMMUNITY): Payer: Self-pay | Admitting: *Deleted

## 2019-06-20 DIAGNOSIS — F319 Bipolar disorder, unspecified: Secondary | ICD-10-CM

## 2019-06-20 MED ORDER — LITHIUM CARBONATE ER 450 MG PO TBCR: EXTENDED_RELEASE_TABLET | ORAL | 0 refills | Status: DC

## 2019-06-25 ENCOUNTER — Ambulatory Visit (INDEPENDENT_AMBULATORY_CARE_PROVIDER_SITE_OTHER): Payer: Medicare Other | Admitting: Psychiatry

## 2019-06-25 ENCOUNTER — Other Ambulatory Visit: Payer: Self-pay

## 2019-06-25 DIAGNOSIS — F319 Bipolar disorder, unspecified: Secondary | ICD-10-CM | POA: Diagnosis not present

## 2019-06-25 NOTE — Progress Notes (Signed)
Virtual Visit via Video Note  I connected with Susan Ball on 06/25/19 at 10:10 AM EDT by a video enabled telemedicine application and verified that I am speaking with the correct person using two identifiers.   I discussed the limitations of evaluation and management by telemedicine and the availability of in person appointments. The patient expressed understanding and agreed to proceed.    I provided 45 minutes of non-face-to-face time during this encounter.   [name], LCSW    THERAPIST PROGRESS NOTE  Session Time:  06/25/2019 10:10 AM - 10:55 AM   Participation Level: Active  Behavioral Response: CasualAlert/ less depressed   Type of Therapy: Individual Therapy  Treatment Goals addressed: Alleviate symptoms of depression/resume normal interest in activities and socialization  Interventions: CBT and Supportive  Summary: Susan Ball is a 49 y.o. female who p is referred for services by [name]. She just completed IOP on March 27, 2019. She has long standing history of bipolar disorder and borderline personality disorder. She has had 3 psychiatric hospitalizations. The last was in 2010 at [facility] due to suicide attempt. She participated in outpatient therapy intermittently at [facility] for 3 years. She last was seen there 6 months ago.  Current symptoms include depressed mood, crying spells, mood swings, aggression, anger, excessive worry, sleep difficulty, poor concentration, and memory difficulty.  Patient was seen via virtual visit about 2 weeks ago.  Per her report, she has been experiencing symptoms of mania for the past 2 weeks including elevated mood, racing thoughts, increased energy level, decreased need for sleep, agitation, and impulsive behavior.  She reports she has been trying to self medicate with use of weed and alcohol.  She realizes this is not helpful and reports knowing it is not good to mix these with medication.  She expresses frustration her  medication has been changed. She is scheduled to meet with psychiatrist Dr. Lolly Mustache next week and agrees to express her concerns.  She has continued to work and attend the gym. Suicidal/Homicidal: Nowithout intent/plan  Therapist Response:  reviewed symptoms, discussed the importance of medication compliance and expressing her concerns to psychiatrist, obtained commitment from patient to work with psychiatrist, discussed scheduling an earlier appointment with psychiatrist  Dr. Lolly Mustache but patient decided to wait until next week, began to try to provide psychoeducation on bipolar disorder and assisted patient identify symptoms, assisted patient identify and replace thoughts that trigger impulsive behavior, discussed consequences of impulsive behavior, assisted patient identify practice calming techniques such as deep breathing and counting, discussed using her support system and talking with her husband.  Plan: Return again in 2  weeks.  Diagnosis: Axis I: Bipolar Disorder    Axis II: Borderline Personality Dis.    [name], LCSW 06/25/2019

## 2019-07-03 ENCOUNTER — Telehealth (HOSPITAL_COMMUNITY): Payer: Medicare Other | Admitting: Psychiatry

## 2019-07-03 ENCOUNTER — Other Ambulatory Visit: Payer: Self-pay

## 2019-07-03 ENCOUNTER — Telehealth (HOSPITAL_COMMUNITY): Payer: Self-pay

## 2019-07-03 NOTE — Telephone Encounter (Signed)
She requested earlier appointment and we move to today. Please call her if she keeping appointment on 08/12/19, than please refill.

## 2019-07-03 NOTE — Telephone Encounter (Signed)
Received a fax from pharmacy requesting a refill on patient's Lithium Carbonate 450mg  CR . Patient had an appointment today but was a No Show. She has another scheduled appointment for 08/12/19. Please review and advise. Thank you.

## 2019-07-09 ENCOUNTER — Other Ambulatory Visit: Payer: Self-pay

## 2019-07-09 ENCOUNTER — Ambulatory Visit (INDEPENDENT_AMBULATORY_CARE_PROVIDER_SITE_OTHER): Payer: Medicare Other | Admitting: Psychiatry

## 2019-07-09 DIAGNOSIS — F319 Bipolar disorder, unspecified: Secondary | ICD-10-CM

## 2019-07-09 NOTE — Progress Notes (Signed)
Virtual Visit via Video Note  I connected with Susan Ball on 07/09/19 at 10:07 AM EDT by a video enabled telemedicine application and verified that I am speaking with the correct person using two identifiers.   I discussed the limitations of evaluation and management by telemedicine and the availability of in person appointments. The patient expressed understanding and agreed to proceed. .  I provided 46 minutes of non-face-to-face time during this encounter.   [name], LCSW    THERAPIST PROGRESS NOTE  Location:  Patient- car/Provider [facility] Outpatient [place] office   Session Time: Wednesday  6/162/2021 10:07 AM -  10:53 AM   Participation Level: Active  Behavioral Response: CasualAlert/ less depressed   Type of Therapy: Individual Therapy  Treatment Goals addressed: Alleviate symptoms of depression/resume normal interest in activities and socialization  Interventions: CBT and Supportive  Summary: Susan Ball is a 49 y.o. female who p is referred for services by [name]. She just completed IOP on March 27, 2019. She has long standing history of bipolar disorder and borderline personality disorder. She has had 3 psychiatric hospitalizations. The last was in 2010 at [facility] due to suicide attempt. She participated in outpatient therapy intermittently at [facility] for 3 years. She last was seen there 6 months ago.  Current symptoms include depressed mood, crying spells, mood swings, aggression, anger, excessive worry, sleep difficulty, poor concentration, and memory difficulty.  Patient was seen via virtual visit about 2 weeks ago.  Per her report, she continues to experience symptoms of mania including h elevated mood, racing thoughts, increased energy level, decreased need for sleep, agitation, and impulsive behavior.  However, symptoms are less intense per her report.  She has been using deep breathing, pausing, and self talk to try to reduce impulsive behaviors.   She reports excessive shopping and having thoughts of invincibility.  She reports issues regarding connecting or receiving a call resulted in an appointment with psychiatrist Dr. Lolly Mustache not being completed.  She did not reschedule and has decided to wait until her July medication management appointment.  She continues to self medicate with alcohol and weed.  She realizes this is not helpful and reports knowing it is not good to mix these with medication.  She reports increased involvement in activity.She did receive handout on bipolar disorder..  She has continued to work and attend the gym.  Suicidal/Homicidal: Nowithout intent/plan  Therapist Response:  reviewed symptoms, discussed the importance of medication compliance and expressing her concerns to psychiatrist, obtained commitment from patient to work with psychiatrist, discussed scheduling an earlier appointment with psychiatrist  Dr. Lolly Mustache but patient decided to wait until July 2021, praised and reinforced patient's efforts to try to reduce impulsive behaviors, continue to provide psychoeducation on bipolar disorder and ways to manage discussed ways to use and talk to  support system to cope with impulsive spending/behaviors, discussed rationale for and provided instructions on how to use daily mood and symptom monitoring, will send patient forms in the mail, develop plan with patient to use forms and bring to next session, also will send patient handout on interventions to manage episodes  Plan: Return again in 2  weeks.  Diagnosis: Axis I: Bipolar Disorder    Axis II: Borderline Personality Dis.    [name], LCSW 07/09/2019

## 2019-07-14 ENCOUNTER — Other Ambulatory Visit: Payer: Self-pay | Admitting: Urology

## 2019-07-17 ENCOUNTER — Other Ambulatory Visit (HOSPITAL_COMMUNITY): Payer: Self-pay

## 2019-07-17 DIAGNOSIS — F319 Bipolar disorder, unspecified: Secondary | ICD-10-CM

## 2019-07-17 MED ORDER — LITHIUM CARBONATE ER 450 MG PO TBCR: EXTENDED_RELEASE_TABLET | ORAL | 0 refills | Status: DC

## 2019-07-23 ENCOUNTER — Other Ambulatory Visit: Payer: Self-pay

## 2019-07-23 ENCOUNTER — Ambulatory Visit (INDEPENDENT_AMBULATORY_CARE_PROVIDER_SITE_OTHER): Payer: Medicare Other | Admitting: Psychiatry

## 2019-07-23 DIAGNOSIS — F319 Bipolar disorder, unspecified: Secondary | ICD-10-CM

## 2019-07-23 NOTE — Progress Notes (Signed)
Virtual Visit via Telephone Note  I connected with [name] on 07/23/19 at 10:10 AM EDT  by telephone and verified that I am speaking with the correct person using two identifiers.   I discussed the limitations, risks, security and privacy concerns of performing an evaluation and management service by telephone and the availability of in person appointments. I also discussed with the patient that there may be a patient responsible charge related to this service. The patient expressed understanding and agreed to proceed.    I provided 45 minutes of non-face-to-face time during this encounter.   [name], LCSW     THERAPIST PROGRESS NOTE  Location:  Patient- car/Provider [facility] Outpatient [place] office   Session Time: Wednesday  07/23/2019 10:10 AM - 10:55 AM    Participation Level: Active  Behavioral Response: CasualAlert/ less depressed   Type of Therapy: Individual Therapy  Treatment Goals addressed: Alleviate symptoms of depression/resume normal interest in activities and socialization  Interventions: CBT and Supportive  Summary: Susan Ball is a 49 y.o. female who p is referred for services by [name]. She just completed IOP on March 27, 2019. She has long standing history of bipolar disorder and borderline personality disorder. She has had 3 psychiatric hospitalizations. The last was in 2010 at [facility] due to suicide attempt. She participated in outpatient therapy intermittently at [facility] for 3 years. She last was seen there 6 months ago.  Current symptoms include depressed mood, crying spells, mood swings, aggression, anger, excessive worry, sleep difficulty, poor concentration, and memory difficulty.  Patient was seen via virtual visit about 2 weeks ago.  Per her report, she continues to experience symptoms of mania including elevated mood, racing thoughts, increased energy level, decreased need for sleep, agitation, and impulsive behavior.  She also  reports beginning to experience symptoms of depression including depressed mood and social withdrawal.  Reports increased irritability and frequent arguments with husband.  She reports she has resumed taking medication as prescribed by Dr. Lolly Mustache.  She is scheduled for a medication management appointment on August 12, 2019 patient.  Patient reports she continues to use marijuana and maintains this helps.  Patient has been using daily mood and symptom monitoring form.   Suicidal/Homicidal: Nowithout intent/plan  Therapist Response:  reviewed symptoms, praised and reinforced medication compliance, encouraged patient to follow through with appointment with psychiatrist Dr. Lolly Mustache, praised and reinforced patient's efforts to use daily mood and symptom monitoring forms, processed informational forms to assist patient recognize her patterns and mood swings, began to identify interventions to manage episodes specifically talking with her husband regarding her needs, also assisted patient identify early signs of anger and anger provoking thoughts along with ways to intervene, developed plan with patient to continue daily mood and symptom monitoring, will send patient more handouts.  Plan: Return again in 2  weeks.  Diagnosis: Axis I: Bipolar Disorder    Axis II: Borderline Personality Dis.    [name], LCSW 07/23/2019

## 2019-08-06 ENCOUNTER — Ambulatory Visit (INDEPENDENT_AMBULATORY_CARE_PROVIDER_SITE_OTHER): Payer: Medicare Other | Admitting: Psychiatry

## 2019-08-06 ENCOUNTER — Other Ambulatory Visit: Payer: Self-pay

## 2019-08-06 DIAGNOSIS — F319 Bipolar disorder, unspecified: Secondary | ICD-10-CM

## 2019-08-06 NOTE — Progress Notes (Signed)
Virtual Visit via Video Note  I connected with Susan Ball on 08/06/19 at 11:16 AM EDT  by a video enabled telemedicine application and verified that I am speaking with the correct person using two identifiers.   I discussed the limitations of evaluation and management by telemedicine and the availability of in person appointments. The patient expressed understanding and agreed to proceed.    I provided 40 minutes of non-face-to-face time during this encounter.     [name], LCSW      THERAPIST PROGRESS NOTE  Location:  Patient- car/Provider [facility] Outpatient [place] office   Session Time: Wednesday  08/06/2019 11:16 AM -  11:56 AM     Participation Level: Active  Behavioral Response: CasualAlert/ less depressed   Type of Therapy: Individual Therapy  Treatment Goals addressed: Alleviate symptoms of depression/resume normal interest in activities and socialization  Interventions: CBT and Supportive  Summary: Susan Ball is a 49 y.o. female who p is referred for services by [name]. She just completed IOP on March 27, 2019. She has long standing history of bipolar disorder and borderline personality disorder. She has had 3 psychiatric hospitalizations. The last was in 2010 at [facility] due to suicide attempt. She participated in outpatient therapy intermittently at [facility] for 3 years. She last was seen there 6 months ago.  Current symptoms include depressed mood, crying spells, mood swings, aggression, anger, excessive worry, sleep difficulty, poor concentration, and memory difficulty.  Patient was seen via virtual visit about 2 weeks ago.  Per her report, her mood is becoming more stable and less manic.  She reports decreased racing thoughts decreased irritability, and decreased impulsive behaviors.  She states beginning to think more clearly and reports she did have conversation with husband regarding ways to assist her in managing episodes.  Per her report, they  have improved their interaction in her marriage and husband has been very supportive.  She also reports absence of alcohol use.  She reports having increased sleep difficulty but says she normally only gets about 3 to 4 hours of sleep per night.  In recent nights, she has only slept about 2 hours.  Trigger appears to be conflict with mom and sister.  Per patient's report, they both treat her as though she is a child.  Patient reports forgetting to use daily mood and symptom monitoring form.   Suicidal/Homicidal: Nowithout intent/plan  Therapist Response:  reviewed symptoms, praised and reinforced patient's absence of alcohol use/having conversation with husband to express needs, encouraged patient to follow through with appointment with psychiatrist Dr. Lolly Mustache, developed plan with patient praised  to use daily mood and symptom monitoring forms, discussed stressors, facilitated expression of thoughts and feelings, validated feelings, began to examine patient's pattern of interaction with her mother, began to discuss ways to set and maintain limits including patient controlling the amount of information she shares with mother   Plan: Return again in 2  weeks.  Diagnosis: Axis I: Bipolar Disorder    Axis II: Borderline Personality Dis.    [name], LCSW 08/06/2019

## 2019-08-12 ENCOUNTER — Other Ambulatory Visit (HOSPITAL_COMMUNITY): Payer: Self-pay | Admitting: *Deleted

## 2019-08-12 ENCOUNTER — Other Ambulatory Visit: Payer: Self-pay

## 2019-08-12 ENCOUNTER — Telehealth (HOSPITAL_COMMUNITY): Payer: Medicare Other | Admitting: Psychiatry

## 2019-08-12 DIAGNOSIS — F319 Bipolar disorder, unspecified: Secondary | ICD-10-CM

## 2019-08-12 MED ORDER — CLONIDINE HCL 0.1 MG PO TABS
0.1000 mg | ORAL_TABLET | Freq: Every day | ORAL | 0 refills | Status: DC
Start: 2019-08-12 — End: 2019-08-27

## 2019-08-17 ENCOUNTER — Other Ambulatory Visit (HOSPITAL_COMMUNITY): Payer: Self-pay | Admitting: Psychiatry

## 2019-08-17 DIAGNOSIS — F319 Bipolar disorder, unspecified: Secondary | ICD-10-CM

## 2019-08-18 ENCOUNTER — Other Ambulatory Visit (HOSPITAL_COMMUNITY): Payer: Self-pay | Admitting: *Deleted

## 2019-08-18 DIAGNOSIS — F319 Bipolar disorder, unspecified: Secondary | ICD-10-CM

## 2019-08-18 MED ORDER — LITHIUM CARBONATE ER 450 MG PO TBCR: EXTENDED_RELEASE_TABLET | ORAL | 0 refills | Status: DC

## 2019-08-18 NOTE — Progress Notes (Addendum)
COVID Vaccine Completed: Date COVID Vaccine completed: COVID vaccine manufacturer: Pfizer    [facility]   PCP -Johny Blamer, MD [facility] Family) Cardiologist - N/A  Chest x-ray -  EKG - 08-19-19 Stress Test -  ECHO -  Cardiac Cath -   Sleep Study -  CPAP -   Fasting Blood Sugar -  Checks Blood Sugar _____ times a day  Blood Thinner Instructions: Aspirin Instructions: Last Dose:  ADL w/o SOB  Anesthesia review:   Patient denies shortness of breath, fever, cough and chest pain at PAT appointment   Patient verbalized understanding of instructions that were given to them at the PAT appointment. Patient was also instructed that they will need to review over the PAT instructions again at home before surgery.

## 2019-08-18 NOTE — Patient Instructions (Addendum)
DUE TO COVID-19 ONLY ONE VISITOR IS ALLOWED TO COME WITH YOU AND STAY IN THE WAITING ROOM ONLY DURING PRE OP AND PROCEDURE DAY OF SURGERY. THE 1 VISITOR MAY VISIT WITH YOU AFTER SURGERY IN YOUR PRIVATE ROOM DURING VISITING HOURS ONLY!  YOU NEED TO HAVE A COVID 19 TEST ON 08-22-19 @11 :05 AM, THIS TEST MUST BE DONE BEFORE SURGERY, COME  [address], [place] , .  [facility]) ONCE YOUR COVID TEST IS COMPLETED, PLEASE BEGIN THE QUARANTINE INSTRUCTIONS AS OUTLINED IN YOUR HANDOUT.                Susan Ball  08/18/2019   Your procedure is scheduled on: 08-26-19   Report to [facility] Main  Entrance    Report to Short Stay at 5:30 AM     Call this number if you have problems the morning of surgery [phone]    Remember: Do not eat food or drink liquids :After Midnight.     Take these medicines the morning of surgery with A SIP OF WATER: Buspirone (Buspar), Fluoxetine (Prozac), and Lithum Carbonate (Eskalith)  BRUSH YOUR TEETH MORNING OF SURGERY AND RINSE YOUR MOUTH OUT, NO CHEWING GUM CANDY OR MINTS.                                You may not have any metal on your body including hair pins and              piercings     Do not wear jewelry, make-up, lotions, powders or perfumes, deodorant              Do not wear nail polish on your fingernails.  Do not shave  48 hours prior to surgery.                 Do not bring valuables to the hospital. [facility] IS NOT             RESPONSIBLE   FOR VALUABLES.  Contacts, dentures or bridgework may not be worn into surgery.       Patients discharged the day of surgery will not be allowed to drive home. IF YOU ARE HAVING SURGERY AND GOING HOME THE SAME DAY, YOU MUST HAVE AN ADULT TO DRIVE YOU HOME AND BE WITH YOU FOR 24 HOURS. YOU MAY GO HOME BY TAXI OR UBER OR ORTHERWISE, BUT AN ADULT MUST ACCOMPANY YOU HOME AND STAY WITH YOU FOR 24 HOURS.  Name and phone number of your driver: Chantrell Apsey  Zebedee Iba  Special Instructions: N/A              Please read over the following fact sheets you were given: _____________________________________________________________________             [facility] - Preparing for Surgery Before surgery, you can play an important role.  Because skin is not sterile, your skin needs to be as free of germs as possible.  You can reduce the number of germs on your skin by washing with CHG (chlorahexidine gluconate) soap before surgery.  CHG is an antiseptic cleaner which kills germs and bonds with the skin to continue killing germs even after washing. Please DO NOT use if you have an allergy to CHG or antibacterial soaps.  If your skin becomes reddened/irritated stop using the CHG and inform your nurse when you arrive at Short Stay. Do  not shave (including legs and underarms) for at least 48 hours prior to the first CHG shower.  You may shave your face/neck. Please follow these instructions carefully:  1.  Shower with CHG Soap the night before surgery and the  morning of Surgery.  2.  If you choose to wash your hair, wash your hair first as usual with your  normal  shampoo.  3.  After you shampoo, rinse your hair and body thoroughly to remove the  shampoo.                           4.  Use CHG as you would any other liquid soap.  You can apply chg directly  to the skin and wash                       Gently with a scrungie or clean washcloth.  5.  Apply the CHG Soap to your body ONLY FROM THE NECK DOWN.   Do not use on face/ open                           Wound or open sores. Avoid contact with eyes, ears mouth and genitals (private parts).                       Wash face,  Genitals (private parts) with your normal soap.             6.  Wash thoroughly, paying special attention to the area where your surgery  will be performed.  7.  Thoroughly rinse your body with warm water from the neck down.  8.  DO NOT shower/wash with your normal soap after using and  rinsing off  the CHG Soap.                9.  Pat yourself dry with a clean towel.            10.  Wear clean pajamas.            11.  Place clean sheets on your bed the night of your first shower and do not  sleep with pets. Day of Surgery : Do not apply any lotions/deodorants the morning of surgery.  Please wear clean clothes to the hospital/surgery center.  FAILURE TO FOLLOW THESE INSTRUCTIONS MAY RESULT IN THE CANCELLATION OF YOUR SURGERY PATIENT SIGNATURE_________________________________  NURSE SIGNATURE__________________________________  ________________________________________________________________________

## 2019-08-19 ENCOUNTER — Other Ambulatory Visit: Payer: Self-pay

## 2019-08-19 ENCOUNTER — Encounter (HOSPITAL_COMMUNITY): Payer: Self-pay

## 2019-08-19 ENCOUNTER — Encounter (HOSPITAL_COMMUNITY)
Admission: RE | Admit: 2019-08-19 | Discharge: 2019-08-19 | Disposition: A | Discharge status: Home / Self Care | Payer: Medicare Other | Source: Ambulatory Visit | Attending: Urology | Admitting: Urology

## 2019-08-19 DIAGNOSIS — Z01818 Encounter for other preprocedural examination: Secondary | ICD-10-CM | POA: Diagnosis not present

## 2019-08-19 LAB — BASIC METABOLIC PANEL
Anion gap: 7 (ref 5–15)
BUN: 9 mg/dL (ref 6–20)
CO2: 25 mmol/L (ref 22–32)
Calcium: 9.3 mg/dL (ref 8.9–10.3)
Chloride: 106 mmol/L (ref 98–111)
Creatinine, Ser: 0.93 mg/dL (ref 0.44–1.00)
GFR calc Af Amer: >60 mL/min (ref >60)
GFR calc non Af Amer: >60 mL/min (ref >60)
Glucose, Bld: 72 mg/dL (ref 70–99)
Potassium: 4.3 mmol/L (ref 3.5–5.1)
Sodium: 138 mmol/L (ref 135–145)

## 2019-08-19 LAB — CBC
HCT: 33.4 % — ABNORMAL LOW (ref 36.0–46.0)
Hemoglobin: 9.7 g/dL — ABNORMAL LOW (ref 12.0–15.0)
MCH: 22.7 pg — ABNORMAL LOW (ref 26.0–34.0)
MCHC: 29 g/dL — ABNORMAL LOW (ref 30.0–36.0)
MCV: 78.2 fL — ABNORMAL LOW (ref 80.0–100.0)
Platelets: 299 10*3/uL (ref 150–400)
RBC: 4.27 MIL/uL (ref 3.87–5.11)
RDW: 17.1 % — ABNORMAL HIGH (ref 11.5–15.5)
WBC: 5.5 10*3/uL (ref 4.0–10.5)
nRBC: 0 % (ref 0.0–0.2)

## 2019-08-20 ENCOUNTER — Ambulatory Visit (INDEPENDENT_AMBULATORY_CARE_PROVIDER_SITE_OTHER): Payer: Medicare Other | Admitting: Psychiatry

## 2019-08-20 DIAGNOSIS — F319 Bipolar disorder, unspecified: Secondary | ICD-10-CM

## 2019-08-20 NOTE — Progress Notes (Signed)
Virtual Visit via Video Note  I connected with Susan Ball on 08/20/19 at 11:05 AM EDT  by a video enabled telemedicine application and verified that I am speaking with the correct person using two identifiers.   I discussed the limitations of evaluation and management by telemedicine and the availability of in person appointments. The patient expressed understanding and agreed to proceed.  I provided 50 minutes of non-face-to-face time during this encounter.   [name], LCSW      THERAPIST PROGRESS NOTE  Location:  Patient- Home/Provider - [facility] [place] office   Session Time: Wednesday  08/20/2019 11:05 AM   - 11:55 AM   Participation Level: Active  Behavioral Response: CasualAlert/ less depressed   Type of Therapy: Individual Therapy  Treatment Goals addressed: Alleviate symptoms of depression/resume normal interest in activities and socialization  Interventions: CBT and Supportive  Summary: Susan Ball is a 49 y.o. female who p is referred for services by [name]. She just completed IOP on March 27, 2019. She has long standing history of bipolar disorder and borderline personality disorder. She has had 3 psychiatric hospitalizations. The last was in 2010 at [facility] due to suicide attempt. She participated in outpatient therapy intermittently at [facility] for 3 years. She last was seen there 6 months ago.  Current symptoms include depressed mood, crying spells, mood swings, aggression, anger, excessive worry, sleep difficulty, poor concentration, and memory difficulty.  Patient was seen via virtual visit about 2 weeks ago.  Per her report, her mood remains stable. She continues to experience racing thoughts, irritability and impulsive behaviors at times.  She cites examples including incidents of road rage.  She continues to report poor sleep pattern and says she sleeps about 4 to 5 hours per night.  She denies alcohol use but reports continued marijuana  use.  She reports improved interaction with her immediate family and says she now speaks to people when she attends the [facility].  She still is not involved in activities/socialization as she would like as she reports poor motivation and negative thoughts.  She also has difficulty with consistent positive daily structure/routine. Patient reports still forgetting to use daily mood and symptom monitoring form.   Suicidal/Homicidal: Nowithout intent/plan  Therapist Response:  reviewed symptoms, praised and reinforced patient's absence of alcohol use and increase involvement in activity, reviewed treatment plan, discussed next steps for treatment (improving daily structure/routine, increasing socialization and involvement in activity, decreasing road rage/impulsivity) developed plan with patient to use daily mood and symptom monitoring form, will send patient handouts in preparation for next session (weekly schedule, activities, sleep hygiene), encourage patient to attend scheduled appointment with psychiatrist Dr. Lolly Mustache.  Plan: Return again in 2  weeks.  Diagnosis: Axis I: Bipolar Disorder    Axis II: Borderline Personality Dis.    [name], LCSW 08/20/2019

## 2019-08-22 ENCOUNTER — Other Ambulatory Visit (HOSPITAL_COMMUNITY)
Admission: RE | Admit: 2019-08-22 | Discharge: 2019-08-22 | Disposition: A | Discharge status: Home / Self Care | Payer: Medicare Other | Source: Ambulatory Visit | Attending: Urology | Admitting: Urology

## 2019-08-22 DIAGNOSIS — Z20822 Contact with and (suspected) exposure to covid-19: Secondary | ICD-10-CM | POA: Insufficient documentation

## 2019-08-22 DIAGNOSIS — Z01812 Encounter for preprocedural laboratory examination: Secondary | ICD-10-CM | POA: Diagnosis present

## 2019-08-22 LAB — SARS CORONAVIRUS 2 (TAT 6-24 HRS): SARS Coronavirus 2: NEGATIVE

## 2019-08-22 NOTE — H&P (Addendum)
I reviewed medical record. Patient has refractory urgency incontinence. I was concerned about Botox 2 to obesity. She was having recurrent bladder infections. She will get up 3-4 times a night where 3 pads a day sometimes damp sometimes soak in. She had moderately severe bedwetting when not infected. Her bladder became little less frequent on daily trimethoprim. I considered switching for 1 breakthrough infection but the culture turned out to be negative. Last visit she was wearing 3-4 pads a day that were quite wet due to urgency incontinence on the trimethoprim.   Patient had canceled the InterStim due to 4000 dollar upfront cost but apparently there was an insurance mix up   The patient now uses [place] as opposed to jump her. This may have been why there was an insurance issue but not for certain. All of Pam double checked the insurance card with the surgical center and then call the hospital and try to find out where it might be best for the patient. Urge incontinence persisting. Frequency stable.       ALLERGIES: No Allergies    MEDICATIONS: Oxybutynin Chloride Er 10 mg tablet, extended release 24 hr TAKE 2 TABLETS ONCE DAILY  Percocet 5 mg-325 mg tablet 1-2 tablet PO Q 8 H PRN  Trimethoprim 100 mg tablet 1 tablet PO Daily  Buspirone Hcl 15 mg tablet  Clonidine Hcl 0.2 mg tablet Oral  Fluoxetine Hcl 20 mg capsule Oral  Lithium Carbonate Er 300 mg tablet, extended release Oral     GU PSH: Interstim Stage 1 - 04/10/2019      PSH Notes: Tubal Ligation, Cesarean Section, High Gastric Bypass   NON-GU PSH: Cesarean Delivery Only - 2008 Tubal Ligation - 2008      GU PMH: Urinary Tract Inf, Unspec site (Stable) - 11/06/2018, Urinary tract infection, - 2014 Dysuria, Dysuria - 2016 Urinary Frequency, Increased urinary frequency - 2016 Mixed incontinence, Urge and stress incontinence - 2016 Chronic cystitis (w/o hematuria), Chronic cystitis - 2014 Continuous incontinence, Urinary  incontinence with continuous leakage - 2014 Nocturia, Nocturia - 2014 Other microscopic hematuria, Microscopic hematuria - 2014 Overactive bladder, Overactive bladder - 2014     PMH Notes:  2006-09-26 09:05:11 - Note: Bipolar Disorder   NON-GU PMH: Encounter for general adult medical examination without abnormal findings, Encounter for preventive health examination - 2016 Anxiety, Anxiety - 2014 Asthma, Asthma - 2014 Disorder of muscle, unspecified, Disorder of muscle, ligament, and fascia - 2014 Muscle wasting and atrophy, not elsewhere classified, multiple sites, Disuse muscle atrophy - 2014 Personal history of other mental and behavioral disorders, History of depression - 2014    FAMILY HISTORY: Diabetes - Sister, Brother, Father renal failure - Father, Mother   SOCIAL HISTORY: None   Notes: Caffeine Use, Marital History - Currently Married, Occupation:, Alcohol Use, Never A Smoker   REVIEW OF SYSTEMS:    GU Review Female:  Patient reports frequent urination. Patient denies hard to postpone urination, burning /pain with urination, get up at night to urinate, leakage of urine, stream starts and stops, trouble starting your stream, have to strain to urinate, and being pregnant.  Gastrointestinal (Upper):  Patient denies nausea, vomiting, and indigestion/ heartburn.  Gastrointestinal (Lower):  Patient denies diarrhea and constipation.  Constitutional:  Patient denies fever, night sweats, weight loss, and fatigue.  Skin:  Patient denies skin rash/ lesion and itching.  Eyes:  Patient denies blurred vision and double vision.  Ears/ Nose/ Throat:  Patient denies sore throat and sinus problems.  Hematologic/Lymphatic:  Patient denies swollen glands and easy bruising.  Cardiovascular:  Patient denies leg swelling and chest pains.  Respiratory:  Patient denies cough and shortness of breath.  Endocrine:  Patient denies excessive thirst.  Musculoskeletal:  Patient denies back pain and  joint pain.  Neurological:  Patient denies headaches and dizziness.  Psychologic:  Patient denies depression and anxiety.   VITAL SIGNS:     06/06/2019 10:44 AM  BP 141/83 mmHg  Pulse 71 /min  Temperature 99.5 F / 37.5 C   PAST DATA REVIEW: None   PROCEDURES:    Urinalysis w/Scope  Micro  WBC/hpf: 10 - 20/hpf  RBC/hpf: 3 - 10/hpf  Bacteria: Rare (0-9/hpf)  Cystals: NS (Not Seen)  Casts: NS (Not Seen)  Trichomonas: Not Present  Mucous: Not Present  Epithelial Cells: 0 - 5/hpf  Yeast: NS (Not Seen)  Sperm: Not Present    ASSESSMENT:     ICD-10 Details  1 GU:  Chronic cystitis (w/o hematuria) - N30.20   2  Mixed incontinence - N39.46    After a thorough review of the management options for the patient's condition the patient  elected to proceed with surgical therapy as noted above. We have discussed the potential benefits and risks of the procedure, side effects of the proposed treatment, the likelihood of the patient achieving the goals of the procedure, and any potential problems that might occur during the procedure or recuperation. Informed consent has been obtained.

## 2019-08-25 NOTE — Anesthesia Preprocedure Evaluation (Addendum)
Anesthesia Evaluation  Patient identified by MRN, date of birth, ID band Patient awake    Reviewed: Allergy & Precautions, NPO status , Patient's Chart, lab work & pertinent test results  History of Anesthesia Complications Negative for: history of anesthetic complications  Airway Mallampati: II  TM Distance: >3 FB Neck ROM: Full    Dental  (+) Missing,    Pulmonary sleep apnea ,    Pulmonary exam normal        Cardiovascular negative cardio ROS Normal cardiovascular exam     Neuro/Psych Anxiety Depression Bipolar Disorder negative neurological ROS     GI/Hepatic Neg liver ROS, GERD  ,  Endo/Other  Morbid obesity  Renal/GU negative Renal ROS   REFRACTORY URGENCY INCONTINENCE    Musculoskeletal negative musculoskeletal ROS (+)   Abdominal   Peds  Hematology  (+) anemia , Hgb 9.7   Anesthesia Other Findings Day of surgery medications reviewed with patient.  Reproductive/Obstetrics negative OB ROS                            Anesthesia Physical Anesthesia Plan  ASA: III  Anesthesia Plan: MAC   Post-op Pain Management:    Induction:   PONV Risk Score and Plan: 2 and Treatment may vary due to age or medical condition, Propofol infusion, Midazolam and Ondansetron  Airway Management Planned: Natural Airway and Simple Face Mask  Additional Equipment: None  Intra-op Plan:   Post-operative Plan:   Informed Consent: I have reviewed the patients History and Physical, chart, labs and discussed the procedure including the risks, benefits and alternatives for the proposed anesthesia with the patient or authorized representative who has indicated his/her understanding and acceptance.       Plan Discussed with: CRNA  Anesthesia Plan Comments:        Anesthesia Quick Evaluation

## 2019-08-26 ENCOUNTER — Ambulatory Visit (HOSPITAL_COMMUNITY): Payer: Medicare Other

## 2019-08-26 ENCOUNTER — Ambulatory Visit (HOSPITAL_COMMUNITY): Payer: Medicare Other | Admitting: Anesthesiology

## 2019-08-26 ENCOUNTER — Encounter (HOSPITAL_COMMUNITY)
Admission: RE | Disposition: A | Discharge status: Home / Self Care | Payer: Self-pay | Source: Ambulatory Visit | Attending: Urology

## 2019-08-26 ENCOUNTER — Encounter (HOSPITAL_COMMUNITY): Payer: Self-pay | Admitting: Urology

## 2019-08-26 ENCOUNTER — Ambulatory Visit (HOSPITAL_COMMUNITY)
Admission: RE | Admit: 2019-08-26 | Discharge: 2019-08-26 | Disposition: A | Discharge status: Home / Self Care | Payer: Medicare Other | Source: Ambulatory Visit | Attending: Urology | Admitting: Urology

## 2019-08-26 DIAGNOSIS — N3941 Urge incontinence: Secondary | ICD-10-CM | POA: Diagnosis not present

## 2019-08-26 DIAGNOSIS — F419 Anxiety disorder, unspecified: Secondary | ICD-10-CM | POA: Insufficient documentation

## 2019-08-26 DIAGNOSIS — F319 Bipolar disorder, unspecified: Secondary | ICD-10-CM | POA: Diagnosis not present

## 2019-08-26 DIAGNOSIS — Z79899 Other long term (current) drug therapy: Secondary | ICD-10-CM | POA: Insufficient documentation

## 2019-08-26 DIAGNOSIS — Z6841 Body Mass Index (BMI) 40.0 and over, adult: Secondary | ICD-10-CM | POA: Insufficient documentation

## 2019-08-26 HISTORY — PX: INTERSTIM IMPLANT PLACEMENT: SHX5130

## 2019-08-26 LAB — PREGNANCY, URINE: Preg Test, Ur: NEGATIVE

## 2019-08-26 SURGERY — INSERTION, SACRAL NERVE STIMULATOR, INTERSTIM, STAGE 1
Anesthesia: Monitor Anesthesia Care

## 2019-08-26 MED ORDER — SODIUM CHLORIDE 0.9 % IV SOLN
INTRAVENOUS | Status: DC | PRN
  Administered 2019-08-26: 500 mL

## 2019-08-26 MED ORDER — BUPIVACAINE-EPINEPHRINE 0.5% -1:200000 IJ SOLN
INTRAMUSCULAR | Status: DC | PRN
  Administered 2019-08-26: 40 mL

## 2019-08-26 MED ORDER — OXYCODONE HCL 5 MG PO TABS: 5.0000 mg | ORAL_TABLET | Freq: Once | ORAL | Status: DC | PRN

## 2019-08-26 MED ORDER — MIDAZOLAM HCL 5 MG/5ML IJ SOLN
INTRAMUSCULAR | Status: DC | PRN
  Administered 2019-08-26: 2 mg via INTRAVENOUS

## 2019-08-26 MED ORDER — CIPROFLOXACIN IN D5W 400 MG/200ML IV SOLN
400.0000 mg | INTRAVENOUS | Status: AC
  Administered 2019-08-26: 400 mg via INTRAVENOUS

## 2019-08-26 MED ORDER — ACETAMINOPHEN 500 MG PO TABS: 1000.0000 mg | ORAL_TABLET | Freq: Once | ORAL | Status: AC

## 2019-08-26 MED ORDER — BUPIVACAINE-EPINEPHRINE (PF) 0.5% -1:200000 IJ SOLN
INTRAMUSCULAR | Status: AC
  Filled 2019-08-26: qty 30

## 2019-08-26 MED ORDER — VANCOMYCIN HCL 1500 MG/300ML IV SOLN
1500.0000 mg | INTRAVENOUS | Status: DC
  Filled 2019-08-26: qty 300

## 2019-08-26 MED ORDER — FENTANYL CITRATE (PF) 100 MCG/2ML IJ SOLN: 25.0000 ug | INTRAMUSCULAR | Status: DC | PRN

## 2019-08-26 MED ORDER — ACETAMINOPHEN 500 MG PO TABS
ORAL_TABLET | ORAL | Status: AC
  Administered 2019-08-26: 1000 mg via ORAL
  Filled 2019-08-26: qty 2

## 2019-08-26 MED ORDER — CHLORHEXIDINE GLUCONATE 0.12 % MT SOLN
15.0000 mL | Freq: Once | OROMUCOSAL | Status: AC
  Administered 2019-08-26: 15 mL via OROMUCOSAL

## 2019-08-26 MED ORDER — LACTATED RINGERS IV SOLN: INTRAVENOUS | Status: DC

## 2019-08-26 MED ORDER — LIDOCAINE 2% (20 MG/ML) 5 ML SYRINGE
INTRAMUSCULAR | Status: DC | PRN
  Administered 2019-08-26: 40 mg via INTRAVENOUS

## 2019-08-26 MED ORDER — CIPROFLOXACIN IN D5W 400 MG/200ML IV SOLN
INTRAVENOUS | Status: AC
  Filled 2019-08-26: qty 200

## 2019-08-26 MED ORDER — MIDAZOLAM HCL 2 MG/2ML IJ SOLN
INTRAMUSCULAR | Status: AC
  Filled 2019-08-26: qty 2

## 2019-08-26 MED ORDER — ORAL CARE MOUTH RINSE: 15.0000 mL | Freq: Once | OROMUCOSAL | Status: AC

## 2019-08-26 MED ORDER — OXYCODONE HCL 5 MG/5ML PO SOLN: 5.0000 mg | Freq: Once | ORAL | Status: DC | PRN

## 2019-08-26 MED ORDER — PROPOFOL 500 MG/50ML IV EMUL
INTRAVENOUS | Status: DC | PRN
  Administered 2019-08-26: 75 ug/kg/min via INTRAVENOUS

## 2019-08-26 MED ORDER — HYDROCODONE-ACETAMINOPHEN 5-325 MG PO TABS: 1.0000 | ORAL_TABLET | Freq: Four times a day (QID) | ORAL | 0 refills | Status: DC | PRN

## 2019-08-26 MED ORDER — SULFAMETHOXAZOLE-TRIMETHOPRIM 800-160 MG PO TABS
1.0000 | ORAL_TABLET | Freq: Two times a day (BID) | ORAL | 0 refills | Status: DC
Start: 2019-08-26 — End: 2020-02-25

## 2019-08-26 MED ORDER — FENTANYL CITRATE (PF) 100 MCG/2ML IJ SOLN
INTRAMUSCULAR | Status: AC
  Filled 2019-08-26: qty 2

## 2019-08-26 MED ORDER — PROPOFOL 1000 MG/100ML IV EMUL
INTRAVENOUS | Status: AC
  Filled 2019-08-26: qty 100

## 2019-08-26 MED ORDER — VANCOMYCIN HCL IN DEXTROSE 1-5 GM/200ML-% IV SOLN: 1000.0000 mg | INTRAVENOUS | Status: DC

## 2019-08-26 MED ORDER — SODIUM CHLORIDE 0.9 % IV SOLN
INTRAVENOUS | Status: AC
  Filled 2019-08-26: qty 500000

## 2019-08-26 MED ORDER — VANCOMYCIN HCL IN DEXTROSE 1-5 GM/200ML-% IV SOLN
INTRAVENOUS | Status: AC
  Administered 2019-08-26: 1000 mg via INTRAVENOUS
  Filled 2019-08-26: qty 200

## 2019-08-26 MED ORDER — LIDOCAINE-EPINEPHRINE (PF) 1 %-1:200000 IJ SOLN
INTRAMUSCULAR | Status: AC
  Filled 2019-08-26: qty 30

## 2019-08-26 MED ORDER — FENTANYL CITRATE (PF) 250 MCG/5ML IJ SOLN
INTRAMUSCULAR | Status: DC | PRN
  Administered 2019-08-26: 25 ug via INTRAVENOUS
  Administered 2019-08-26: 50 ug via INTRAVENOUS
  Administered 2019-08-26: 25 ug via INTRAVENOUS

## 2019-08-26 MED ORDER — PROMETHAZINE HCL 25 MG/ML IJ SOLN: 6.2500 mg | INTRAMUSCULAR | Status: DC | PRN

## 2019-08-26 SURGICAL SUPPLY — 64 items
APL PRP STRL LF DISP 70% ISPRP (MISCELLANEOUS) ×1
APL SKNCLS STERI-STRIP NONHPOA (GAUZE/BANDAGES/DRESSINGS) ×1
BELT PT INTERSTIM MICRO SYSTEM (MISCELLANEOUS) ×2 IMPLANT
BELT SPRT LRG NS LF REUSE (MISCELLANEOUS) ×1
BENZOIN TINCTURE PRP APPL 2/3 (GAUZE/BANDAGES/DRESSINGS) ×2 IMPLANT
BLADE HEX COATED 2.75 (ELECTRODE) ×2 IMPLANT
BLADE SURG 15 STRL LF DISP TIS (BLADE) ×1 IMPLANT
BLADE SURG 15 STRL SS (BLADE) ×2
BLADE SURG SZ10 CARB STEEL (BLADE) ×4 IMPLANT
CHLORAPREP W/TINT 26 (MISCELLANEOUS) ×2 IMPLANT
COVER TRANSDUCER ULTRASND GEL (DISPOSABLE) ×2 IMPLANT
COVER WAND RF STERILE (DRAPES) IMPLANT
DECANTER SPIKE VIAL GLASS SM (MISCELLANEOUS) ×2 IMPLANT
DRAPE C-ARM 42X120 X-RAY (DRAPES) ×2 IMPLANT
DRAPE C-ARMOR (DRAPES) ×2 IMPLANT
DRAPE INCISE 23X17 IOBAN STRL (DRAPES) ×1
DRAPE INCISE 23X17 STRL (DRAPES) ×1 IMPLANT
DRAPE INCISE IOBAN 23X17 STRL (DRAPES) ×1 IMPLANT
DRAPE LAPAROSCOPIC ABDOMINAL (DRAPES) ×2 IMPLANT
DRAPE SHEET LG 3/4 BI-LAMINATE (DRAPES) ×2 IMPLANT
DRSG TEGADERM 2-3/8X2-3/4 SM (GAUZE/BANDAGES/DRESSINGS) ×3 IMPLANT
DRSG TEGADERM 4X4.75 (GAUZE/BANDAGES/DRESSINGS) ×4 IMPLANT
DRSG TELFA PLUS 4X6 ADH ISLAND (GAUZE/BANDAGES/DRESSINGS) ×2 IMPLANT
GAUZE 4X4 16PLY RFD (DISPOSABLE) ×2 IMPLANT
GAUZE SPONGE 4X4 12PLY STRL (GAUZE/BANDAGES/DRESSINGS) ×2 IMPLANT
GLOVE BIO SURGEON STRL SZ7.5 (GLOVE) ×2 IMPLANT
GLOVE BIOGEL M STRL SZ7.5 (GLOVE) ×2 IMPLANT
GOWN STRL REUS W/TWL XL LVL3 (GOWN DISPOSABLE) ×2 IMPLANT
KIT BASIN OR (CUSTOM PROCEDURE TRAY) ×2 IMPLANT
KIT HANDSET INTERSTIM COMM (NEUROSURGERY SUPPLIES) ×2 IMPLANT
KIT RECHARGE INTERSTIM (NEUROSURGERY SUPPLIES) ×2 IMPLANT
KIT TURNOVER KIT A (KITS) IMPLANT
LEAD INTERSTIM 2.16 28 L (Lead) ×2 IMPLANT
NDL FORAMN 5 20GA (NEUROSURGERY SUPPLIES) IMPLANT
NDL HYPO 25X1 1.5 SAFETY (NEEDLE) IMPLANT
NEEDLE FORAMEN 20GA 5  12.5CM (NEEDLE) IMPLANT
NEEDLE FORAMN 5 20GA (NEUROSURGERY SUPPLIES) ×2 IMPLANT
NEEDLE HYPO 25X1 1.5 SAFETY (NEEDLE) IMPLANT
NEUROSTIM INTERSTIM (Neurostimulator) ×1 IMPLANT
NEUROSTIMULATOR 1.7X2X.06 (UROLOGICAL SUPPLIES) ×2 IMPLANT
NEUROSTIMULATOR INTERSTIM (Neurostimulator) ×2 IMPLANT
NS IRRIG 1000ML POUR BTL (IV SOLUTION) ×2 IMPLANT
PACK GENERAL/GYN (CUSTOM PROCEDURE TRAY) ×2 IMPLANT
PAD TELFA 2X3 NADH STRL (GAUZE/BANDAGES/DRESSINGS) ×1 IMPLANT
PENCIL SMOKE EVACUATOR (MISCELLANEOUS) IMPLANT
SOL PREP PROV IODINE SCRUB 4OZ (MISCELLANEOUS) ×2 IMPLANT
STAPLER VISISTAT 35W (STAPLE) IMPLANT
SUT ETHILON 3 0 PS 1 (SUTURE) IMPLANT
SUT MNCRL 0 MO-4 VIOLET 18 CR (SUTURE) IMPLANT
SUT MONOCRYL 0 MO 4 18  CR/8 (SUTURE) ×4
SUT PROLENE 2 0 CT2 30 (SUTURE) IMPLANT
SUT SILK 2 0 (SUTURE) ×2
SUT SILK 2 0 SH (SUTURE) IMPLANT
SUT SILK 2-0 18XBRD TIE 12 (SUTURE) ×1 IMPLANT
SUT VIC AB 2-0 UR6 27 (SUTURE) ×4 IMPLANT
SUT VIC AB 3-0 SH 27 (SUTURE) ×4
SUT VIC AB 3-0 SH 27X BRD (SUTURE) ×2 IMPLANT
SUT VIC AB 4-0 PS2 27 (SUTURE) ×4 IMPLANT
SYR CONTROL 10ML LL (SYRINGE) IMPLANT
TOWEL OR 17X26 10 PK STRL BLUE (TOWEL DISPOSABLE) ×2 IMPLANT
TOWEL OR NON WOVEN STRL DISP B (DISPOSABLE) ×2 IMPLANT
TRAY FOLEY MTR SLVR 16FR STAT (SET/KITS/TRAYS/PACK) ×2 IMPLANT
TRAY PREP A LATEX SAFE STRL (SET/KITS/TRAYS/PACK) ×2 IMPLANT
WATER STERILE IRR 1000ML POUR (IV SOLUTION) ×2 IMPLANT

## 2019-08-26 NOTE — Op Note (Signed)
Preoperative diagnosis: Refractory urgency incontinence Postoperative diagnosis: Refractory urgency incontinence Surgery: Stage I and stage II placement of InterStim and impedance check Surgeon: Dr. Lorin Picket Jazyah Butsch  The patient has the above diagnosis and consented the above procedure.  Extra care was taken in the prone position to position the patient.  Skin prep applied.  Preoperative antibiotics given.  The S3 foramina was marked easily with foramen needles bilaterally.  A total of 20 cc of lidocaine epinephrine mixture was utilized on both sides eventually.  I passed a 3.5 inch foramen needle under fluoroscopic guidance through the what I thought was S3 on the right.  She had excellent bellows response but no toe response.  I spent another 20 minutes or so trying to find other foramen on the right which I did but I was not happy with the responses.  I could not exclude other foramen for certain so I did the same procedure on the patient's left.  I used a 5 inch foramen needle and passed it through S3.Marland Kitchen  At very low amplitude she had excellent toe and Bellow response and it was in the same foramina as the original position on the patient's right side.    The inner aspect of the foramen needle was removed and the guide was placed.  Scalpel incision was made 1 cm appropriate depth.  White trocar was passed to appropriate depth.  Guide and inner aspect of trocar removed.  Well-prepared lead that with Coude shaped was placed to the appropriate depth.  She had excellent bellows and toe response at all 4-lead positions at low amplitude.  Radiographically it looked excellent.  Under fluoroscopic guidance I removed the white trocar.  X-ray was taken lateral and AP view and printed.  Rechargeable IPG was utilized.  Soft tissue bony landmarks were utilized.  I made a 3 cm incision parallel to the skin lines.  I dissected down approximately half an inch.  I made a small pocket upward and downward.  I was  diligent in keeping the same depth throughout the length of the pocket.  The majority of the pocket was caudal to the original incision  The passer was utilized to deliver the lead from medial to lateral at appropriate depth.  The lead was attached to the IPG with screwdriver with described technique.  The IPG was placed in the pocket in an up-and-down position.  It laid nice and flat.  Approximately 1 cm of it was cephalad to the incision and the rest of the IPG was below the incision.  It laid nice and flat.  It was placed in this position to minimize the risk of turning.  I was very pleased with this position.   Impedance was checked in all 4 positions and they were normal.  Left lateral incision was closed with 3-0 Vicryl subcutaneous tissue followed by 4-0 Monocryl.  Midline incision was closed with interrupted 4-0 Monocryl.  Sterile dressing was applied.  Hopefully the patient will reach her treatment goal.

## 2019-08-26 NOTE — Interval H&P Note (Signed)
History and Physical Interval Note:  08/26/2019 7:06 AM  Susan Ball  has presented today for surgery, with the diagnosis of REFRACTORY URGENCY INCONTINENCE.  The various methods of treatment have been discussed with the patient and family. After consideration of risks, benefits and other options for treatment, the patient has consented to  Procedure(s): INTERSTIM IMPLANT FIRST STAGE (N/A) INTERSTIM IMPLANT SECOND STAGE (N/A) as a surgical intervention.  The patient's history has been reviewed, patient examined, no change in status, stable for surgery.  I have reviewed the patient's chart and labs.  Questions were answered to the patient's satisfaction.     Adanya Sosinski A Graves Nipp

## 2019-08-26 NOTE — Anesthesia Postprocedure Evaluation (Signed)
Anesthesia Post Note  Patient: Susan Ball  Procedure(s) Performed: Barrie Lyme IMPLANT FIRST STAGE (N/A ) INTERSTIM IMPLANT SECOND STAGE (N/A )     Patient location during evaluation: PACU Anesthesia Type: MAC Level of consciousness: awake and alert and oriented Pain management: pain level controlled Vital Signs Assessment: post-procedure vital signs reviewed and stable Respiratory status: spontaneous breathing, nonlabored ventilation and respiratory function stable Cardiovascular status: blood pressure returned to baseline Postop Assessment: no apparent nausea or vomiting Anesthetic complications: no   No complications documented.  Last Vitals:  Vitals:   08/26/19 0915 08/26/19 0930  BP: 131/71 118/64  Pulse: (!) 58 (!) 51  Resp: 16 15  Temp:  36.5 C  SpO2: 100% 100%    Last Pain:  Vitals:   08/26/19 0930  TempSrc:   PainSc: 0-No pain                 Brennan Bailey

## 2019-08-26 NOTE — Transfer of Care (Signed)
Immediate Anesthesia Transfer of Care Note  Patient: Susan Ball  Procedure(s) Performed: Barrie Lyme IMPLANT FIRST STAGE (N/A ) INTERSTIM IMPLANT SECOND STAGE (N/A )  Patient Location: PACU  Anesthesia Type:MAC  Level of Consciousness: awake, alert , oriented and patient cooperative  Airway & Oxygen Therapy: Patient Spontanous Breathing and Patient connected to face mask oxygen  Post-op Assessment: Report given to RN, Post -op Vital signs reviewed and stable and Patient moving all extremities  Post vital signs: Reviewed and stable  Last Vitals:  Vitals Value Taken Time  BP    Temp    Pulse 66 08/26/19 0910  Resp 21 08/26/19 0910  SpO2 100 % 08/26/19 0910  Vitals shown include unvalidated device data.  Last Pain:  Vitals:   08/26/19 0619  TempSrc: Oral         Complications: No complications documented.

## 2019-08-27 ENCOUNTER — Other Ambulatory Visit: Payer: Self-pay

## 2019-08-27 ENCOUNTER — Encounter (HOSPITAL_COMMUNITY): Payer: Self-pay | Admitting: Psychiatry

## 2019-08-27 ENCOUNTER — Telehealth (INDEPENDENT_AMBULATORY_CARE_PROVIDER_SITE_OTHER): Payer: Medicare Other | Admitting: Psychiatry

## 2019-08-27 VITALS — Wt 259.0 lb

## 2019-08-27 DIAGNOSIS — F319 Bipolar disorder, unspecified: Secondary | ICD-10-CM

## 2019-08-27 DIAGNOSIS — F419 Anxiety disorder, unspecified: Secondary | ICD-10-CM

## 2019-08-27 DIAGNOSIS — F6089 Other specific personality disorders: Secondary | ICD-10-CM

## 2019-08-27 MED ORDER — FLUOXETINE HCL 40 MG PO CAPS: 40.0000 mg | ORAL_CAPSULE | Freq: Every day | ORAL | 0 refills | Status: DC

## 2019-08-27 MED ORDER — FLUOXETINE HCL 20 MG PO CAPS: 20.0000 mg | ORAL_CAPSULE | Freq: Every day | ORAL | 0 refills | Status: DC

## 2019-08-27 MED ORDER — CLONIDINE HCL 0.1 MG PO TABS: 0.1000 mg | ORAL_TABLET | Freq: Every day | ORAL | 0 refills | Status: DC

## 2019-08-27 MED ORDER — BUSPIRONE HCL 5 MG PO TABS: 5.0000 mg | ORAL_TABLET | Freq: Every day | ORAL | 0 refills | Status: DC

## 2019-08-27 MED ORDER — LITHIUM CARBONATE ER 450 MG PO TBCR: EXTENDED_RELEASE_TABLET | ORAL | 0 refills | Status: DC

## 2019-08-27 NOTE — Progress Notes (Signed)
Virtual Visit via Telephone Note  I connected with Susan Ball on 08/27/19 at 11:40 AM EDT by telephone and verified that I am speaking with the correct person using two identifiers.  Location: Patient: home Provider: home office   I discussed the limitations, risks, security and privacy concerns of performing an evaluation and management service by telephone and the availability of in person appointments. I also discussed with the patient that there may be a patient responsible charge related to this service. The patient expressed understanding and agreed to proceed.   History of Present Illness: Patient is evaluated by phone session.  Yesterday she had a procedure for the bladder and she is in pain.  She is taking pain medication.  She is taking 2 days off from the work.  She is taking BuSpar 5 mg daily and she feels it does help with anxiety.  She is taking Prozac 60 mg.  She got upset when she was told to lower the dose from 80 mg.  I explained 80 mg is very high dose.  She reported her mood swings are stable and she is in therapy with [name].  She denies any mania, psychosis, hallucination.  Patient has chronic trust issues but lately she feels the relationship with her husband is much better.  Patient is working as an Public house manager.  I reviewed recent blood work and her BUN/creatinine is normal.  Her last lithium level was 0.7.  Patient denies any tremors, shakes or any EPS.  Her weight is 259 which she believes gain in recent months.  She does power lifting and treadmill and aware that she need to lose weight.  She had not contact the PCP to address anemia.  Sometimes she feels tired.  She denies any recent self harming behavior, hallucination, suicidal thoughts or any crying spells.  She does not want to change medication.    Past Psychiatric History: H/O Bipolar, anxiety and borderline traits. Taking meds since age 12. H/O multiple inpatient and suicidal attempt. H/O overdose, jump from  the car and banging head. Tried Lamictal (rash), Seroquel, Abilify, doxepin, Trazadone and hydroxyzine. H/O n/c with meds, refusing to eat and needed PICC line. H/O anger, road rage, paranoia and trust issues. Had DBT with good response.   Recent Results (from the past 2160 hour(s))  Basic metabolic panel per protocol     Status: None   Collection Time: 08/19/19  1:28 PM  Result Value Ref Range   Sodium 138 135 - 145 mmol/L   Potassium 4.3 3.5 - 5.1 mmol/L   Chloride 106 98 - 111 mmol/L   CO2 25 22 - 32 mmol/L   Glucose, Bld 72 70 - 99 mg/dL    Comment: Glucose reference range applies only to samples taken after fasting for at least 8 hours.   BUN 9 6 - 20 mg/dL   Creatinine, Ser 5.09 0.44 - 1.00 mg/dL   Calcium 9.3 8.9 - 32.6 mg/dL   GFR calc non Af Amer >60 >60 mL/min   GFR calc Af Amer >60 >60 mL/min   Anion gap 7 5 - 15    Comment: Performed at [facility], [address] [place], [place] [address]  CBC per protocol     Status: Abnormal   Collection Time: 08/19/19  1:28 PM  Result Value Ref Range   WBC 5.5 4.0 - 10.5 K/uL   RBC 4.27 3.87 - 5.11 MIL/uL   Hemoglobin 9.7 (L) 12.0 - 15.0 g/dL  HCT 33.4 (L) 36 - 46 %   MCV 78.2 (L) 80.0 - 100.0 fL   MCH 22.7 (L) 26.0 - 34.0 pg   MCHC 29.0 (L) 30.0 - 36.0 g/dL   RDW 41.6 (H) 60.6 - 30.1 %   Platelets 299 150 - 400 K/uL   nRBC 0.0 0.0 - 0.2 %    Comment: Performed at [facility], [address], [place], [place] [address]  SARS CORONAVIRUS 2 (TAT 6-24 HRS) Nasopharyngeal Nasopharyngeal Swab     Status: None   Collection Time: 08/22/19 11:33 AM   Specimen: Nasopharyngeal Swab  Result Value Ref Range   SARS Coronavirus 2 NEGATIVE NEGATIVE    Comment: (NOTE) SARS-CoV-2 target nucleic acids are NOT DETECTED.  The SARS-CoV-2 RNA is generally detectable in upper and lower respiratory specimens during the acute phase of infection. Negative results do not preclude SARS-CoV-2 infection, do not  rule out co-infections with other pathogens, and should not be used as the sole basis for treatment or other patient management decisions. Negative results must be combined with clinical observations, patient history, and epidemiological information. The expected result is Negative.  Fact Sheet for Patients: HairSlick.no  Fact Sheet for Healthcare Providers: quierodirigir.com  This test is not yet approved or cleared by the [place] FDA and  has been authorized for detection and/or diagnosis of SARS-CoV-2 by FDA under an Emergency Use Authorization (EUA). This EUA will remain  in effect (meaning this test can be used) for the duration of the COVID-19 declaration under Se ction 564(b)(1) of the Act, 21 U.S.C. section 360bbb-3(b)(1), unless the authorization is terminated or revoked sooner.  Performed at [facility], [address] [place], [place] [address]   Pregnancy, urine     Status: None   Collection Time: 08/26/19  5:43 AM  Result Value Ref Range   Preg Test, Ur NEGATIVE NEGATIVE    Comment:        THE SENSITIVITY OF THIS METHODOLOGY IS >20 mIU/mL. Performed at [facility] [place], [address] [place], [place] [address]       Psychiatric Specialty Exam: Physical Exam  Review of Systems  Weight 259 lb (117.5 kg), last menstrual period 08/14/2019.There is no height or weight on file to calculate BMI.  General Appearance: NA  Eye Contact:  NA  Speech:  Clear and Coherent and Slow  Volume:  Decreased  Mood:  Euthymic  Affect:  NA  Thought Process:  Goal Directed  Orientation:  Full (Time, Place, and Person)  Thought Content:  trust issues  Suicidal Thoughts:  No  Homicidal Thoughts:  No  Memory:  Immediate;   Good Recent;   Good Remote;   Good  Judgement:  Intact  Insight:  Shallow  Psychomotor Activity:  NA  Concentration:  Concentration: Fair and Attention Span: Fair  Recall:   Good  Fund of Knowledge:  Good  Language:  Good  Akathisia:  No  Handed:  Right  AIMS (if indicated):     Assets:  Desire for Improvement Housing Talents/Skills Transportation  ADL's:  Intact  Cognition:  WNL  Sleep:   ok      Assessment and Plan: Bipolar disorder type I.  Anxiety.  Borderline traits/cluster B  Patient overall stable on her current medication.  I reviewed blood work results.  Her BUN and creatinine is normal.  Continue lithium 450 mg twice a day, Prozac 60 mg daily, clonidine 0.1 mg at bedtime and BuSpar 5  mg daily.  I recommend to try BuSpar 5 mg twice a day to help the residual anxiety but patient at this time.  Like to change or increase the medication.  I encouraged to continue therapy with [name].  Discussed medication side effects and benefits.  Recommended to call us back if she has any question, concern or worsening of the symptoms.  Follow-up in 3 months.  Follow Up Instructions:    I discussed the assessment and treatment plan with the patient. The patient was provided an opportunity to ask questions and all were answered. The patient agreed with the plan and demonstrated an understanding of the instructions.   The patient was advised to call back or seek an in-person evaluation if the symptoms worsen or if the condition fails to improve as anticipated.  I provided 20 minutes of non-face-to-face time during this encounter.   Cleotis Nipper, MD

## 2019-08-28 ENCOUNTER — Encounter (HOSPITAL_COMMUNITY): Payer: Self-pay | Admitting: Urology

## 2019-09-04 ENCOUNTER — Ambulatory Visit (INDEPENDENT_AMBULATORY_CARE_PROVIDER_SITE_OTHER): Payer: Medicare Other | Admitting: Psychiatry

## 2019-09-04 ENCOUNTER — Other Ambulatory Visit: Payer: Self-pay

## 2019-09-04 DIAGNOSIS — F319 Bipolar disorder, unspecified: Secondary | ICD-10-CM

## 2019-09-04 NOTE — Progress Notes (Signed)
Virtual Visit via Video Note  I connected with Susan Ball on 09/04/19 at 11:12 AM EDT  by a video enabled telemedicine application and verified that I am speaking with the correct person using two identifiers.   I discussed the limitations of evaluation and management by telemedicine and the availability of in person appointments. The patient expressed understanding and agreed to proceed.  I provided 38 minutes of non-face-to-face time during this encounter.   [name], LCSW     THERAPIST PROGRESS NOTE  Location:  Patient- Home/Provider - [facility] Outpatient [place] office   Session Time: Thursday 09/04/2019 11:12 AM -   11:50 AM    Participation Level: Active  Behavioral Response: CasualAlert/ less depressed   Type of Therapy: Individual Therapy  Treatment Goals addressed: Alleviate symptoms of depression/resume normal interest in activities and socialization  Interventions: CBT and Supportive  Summary: Susan Ball is a 49 y.o. female who p is referred for services by [name]. She just completed IOP on March 27, 2019. She has long standing history of bipolar disorder and borderline personality disorder. She has had 3 psychiatric hospitalizations. The last was in 2010 at [facility] due to suicide attempt. She participated in outpatient therapy intermittently at [facility] for 3 years. She last was seen there 6 months ago.  Current symptoms include depressed mood, crying spells, mood swings, aggression, anger, excessive worry, sleep difficulty, poor concentration, and memory difficulty.  Patient was seen via virtual visit about 2 weeks ago.  Per her report, her mood has been stable until this past weekend.  She reports she has been using daily mood and symptom monitoring form and a weekly planning schedule.  She reports increased involvement in activities and improved daily structure.  She also reports absence of road rage.  She expresses frustration she began to experience  down mood this past Saturday.  She identifies the trigger as recent negative interaction with husband.  Per patient's report, husband was laid off his job this past Saturday and has become very irritable and argumentative with patient.  She expresses frustration and hurt regarding his behavior.  Patient reports following through with appointment with psychiatrist Dr. Lolly Mustache, maintaining medication compliance, and abstaining from alcohol use.  She reports continued marijuana use but says she has not used in the past 2 weeks. Suicidal/Homicidal: Nowithout intent/plan  Therapist Response:  reviewed symptoms, praised and reinforced use of mood symptom monitoring form and weekly planning schedule, discussed effects, reviewed rationale for use of monitoring form and weekly planning schedule in managing bipolar disorder, discussed stressors and triggers of increased depressed mood, facilitated expression of thoughts and feelings, validated feelings, assisted patient identify ways to and improve assertiveness communication versus aggressive communication to express concerns to husband, will send patient handout on assertive communication, also assisted patient identify ways to maintain consistent involvement in activities and behavioral activation to overcome depression   Plan: Return again in 2  weeks.  Diagnosis: Axis I: Bipolar Disorder    Axis II: Borderline Personality Dis.    [name], LCSW 09/04/2019

## 2019-09-08 ENCOUNTER — Encounter (HOSPITAL_COMMUNITY): Payer: Self-pay | Admitting: Urology

## 2019-09-17 ENCOUNTER — Ambulatory Visit (INDEPENDENT_AMBULATORY_CARE_PROVIDER_SITE_OTHER): Payer: Medicare Other | Admitting: Psychiatry

## 2019-09-17 ENCOUNTER — Other Ambulatory Visit: Payer: Self-pay

## 2019-09-17 DIAGNOSIS — F319 Bipolar disorder, unspecified: Secondary | ICD-10-CM

## 2019-09-17 NOTE — Progress Notes (Signed)
Virtual Visit via Video Note  I connected with Susan Ball on 09/17/19 at 9:05 AM EDT  by a video enabled telemedicine application and verified that I am speaking with the correct person using two identifiers.   I discussed the limitations of evaluation and management by telemedicine and the availability of in person appointments. The patient expressed understanding and agreed to proceed.  I provided 50  minutes of non-face-to-face time during this encounter.   [name], LCSW     THERAPIST PROGRESS NOTE  Location:  Patient- Home/Provider - [facility] Outpatient [place] office   Session Time: Wednesday 09/17/2019 9:05 AM - 9:55  AM   Participation Level: Active  Behavioral Response: CasualAlert/ less depressed   Type of Therapy: Individual Therapy  Treatment Goals addressed: Alleviate symptoms of depression/resume normal interest in activities and socialization  Interventions: CBT and Supportive  Summary: Susan Ball is a 49 y.o. female who p is referred for services by [name]. She just completed IOP on March 27, 2019. She has long standing history of bipolar disorder and borderline personality disorder. She has had 3 psychiatric hospitalizations. The last was in 2010 at [facility] due to suicide attempt. She participated in outpatient therapy intermittently at [facility] for 3 years. She last was seen there 6 months ago.  Current symptoms include depressed mood, crying spells, mood swings, aggression, anger, excessive worry, sleep difficulty, poor concentration, and memory difficulty.  Patient was seen via virtual visit about 2 weeks ago.  Per her report, her mood has been stable.  She reports continued increase in involvement in activities and improved daily structure.  She has resumed working.  She continues to attend the gym and reports recently starting to attend a Zumba class.  She continues to report absence of road rage and denies any negative interactions in public.   She reports continued stress regarding her marriage as she reports husband remains irritable and argumentative.  She attributes this to her husband's recent job loss.  She expresses sadness and frustration regarding her husband's behavior.  She reports she has been coping by increased use of weed to 2-3 times per day.  She reports taking lithium as prescribed but not taking BuSpar as she says this medication tends to make her more anxious.  Patient reports she has not received handout on assertive communication.  Suicidal/Homicidal: Nowithout intent/plan  Therapist Response:  reviewed symptoms, praised and reinforced patient's consistent involvement in activities and behavioral activation, discussed effects, discussed stressors, facilitated expression of thoughts and feelings, validated feelings, began to assist patient identify ways to improve assertive communication with husband with the use of iMessages, also discussed disengaging to avoid escalation, will send patient handout again on assertive communication, reviewed relaxation techniques and encouraged patient to resume use of deep breathing, also discussed ways to express concerns assertively to her psychiatrist regarding her medication    Plan: Return again in 2  weeks.  Diagnosis: Axis I: Bipolar Disorder    Axis II: Borderline Personality Dis.    [name], LCSW 09/17/2019

## 2019-09-19 ENCOUNTER — Telehealth (HOSPITAL_COMMUNITY): Payer: Self-pay

## 2019-09-19 NOTE — Telephone Encounter (Signed)
Called and spoke with patient about her DMV form. She has a scheduled followup for 11/27/19 but stated that she has to have the form turned in within 30 days so she stated that she can't wait until November to speak to you about it. She requested you call her at [phone]. Thank you.

## 2019-09-20 ENCOUNTER — Other Ambulatory Visit (HOSPITAL_COMMUNITY): Payer: Self-pay | Admitting: Psychiatry

## 2019-09-20 DIAGNOSIS — F319 Bipolar disorder, unspecified: Secondary | ICD-10-CM

## 2019-09-20 DIAGNOSIS — F419 Anxiety disorder, unspecified: Secondary | ICD-10-CM

## 2019-09-22 NOTE — Telephone Encounter (Signed)
I spoke to patient about her DMV form.  Patient told she require this form every year due to history of multiple psychiatric inpatient, chronic health issues and required glasses to drive.  These forms are filled by medical doctor, her eye physician and psychiatrist.  Patient told her previous psychiatrist at [facility] and [facility] has been doing renewal in the past.  She never stopped driving but does require every year renewal.  She denies any history of seizures.  She is taking her medication and she feels much stable on the medication.  We will renew her DMV form.

## 2019-09-24 NOTE — Telephone Encounter (Signed)
1. Partially controlled 2. Mild or no memory problem 3. Average

## 2019-09-24 NOTE — Telephone Encounter (Signed)
Hi Dr. Zelphia Cairo so I need just an answer to these 3 questions: !) What is patient's current status?  -Recovered  -Partially controlled  -Intermittently Controlled  -Inadequately Controlled  -Fully controlled  2) To what degree is the patient's memory problems?  -Mild  -Significant  -Severe  3)What is the patient's mental capacity?  -Average or Above  -Below Average  -Limited  Please send the appropriate answers back to me. Thank you.

## 2019-09-25 NOTE — Telephone Encounter (Signed)
Paperwork faxed. Thank you.

## 2019-10-01 ENCOUNTER — Other Ambulatory Visit: Payer: Self-pay

## 2019-10-01 ENCOUNTER — Ambulatory Visit (INDEPENDENT_AMBULATORY_CARE_PROVIDER_SITE_OTHER): Payer: Medicare Other | Admitting: Psychiatry

## 2019-10-01 DIAGNOSIS — F319 Bipolar disorder, unspecified: Secondary | ICD-10-CM

## 2019-10-01 NOTE — Progress Notes (Signed)
Virtual Visit via Video Note  I connected with [name] on 10/01/19 at 10:12 AM EDT  by a video enabled telemedicine application and verified that I am speaking with the correct person using two identifiers.   I discussed the limitations of evaluation and management by telemedicine and the availability of in person appointments. The patient expressed understanding and agreed to proceed.  I provided 47 minutes of non-face-to-face time during this encounter.   [name], LCSW    THERAPIST PROGRESS NOTE  Location:  Patient- Home/Provider - [facility] Outpatient [place] office   Session Time: Wednesday 10/01/2019 10:12 AM -  10:59 AM   Participation Level: Active  Behavioral Response: CasualAlert/ depressed/tearful  Type of Therapy: Individual Therapy  Treatment Goals addressed: Alleviate symptoms of depression/resume normal interest in activities and socialization  Interventions: CBT and Supportive  Summary: Susan Ball is a 49 y.o. female who p is referred for services by [name]. She just completed IOP on March 27, 2019. She has long standing history of bipolar disorder and borderline personality disorder. She has had 3 psychiatric hospitalizations. The last was in 2010 at [facility] due to suicide attempt. She participated in outpatient therapy intermittently at [facility] for 3 years. She last was seen there 6 months ago.  Current symptoms include depressed mood, crying spells, mood swings, aggression, anger, excessive worry, sleep difficulty, poor concentration, and memory difficulty.  Patient was seen via virtual visit about 2 weeks ago.  She reports increased depressed mood and tearfulness. Triggers appear to be continued conflict with husband and 62 yo son recently moving in with his girl friend. Patient expresses sadness and frustration with husband but does say husband apologized for his behavior. However, he remains irritable per her report. She expresses disappointment  regarding son's decision. She reports thoughts of her life falling apart and feeling overwhelmed. She reports she has continued to work and she has continued to attend the [facility] [employer] [facility]. She also reports still using weed to try to cope. She reports she is not yelling or responding aggressively but sees herself as being passive with her husband and son.  Suicidal/Homicidal: Nowithout intent/plan  Therapist Response:  reviewed symptoms, discussed stressors and identified triggers of increased depressed mood, facilitated expression of thoughts and feelings, validated feelings, reviewed role of self-care and behavioral activation to avoid relapse of depression, encouraged patient to maintain involvement in activities, assisted patient examine her thought patterns and connection to mood/behavior, assisted patient identify ways to use her spirituality to develop coping statements, developed plan with patient to use statements daily, also encouraged patient to journal, began to distinguish passive/assertive/aggressive communication, assisted patient identify ways to set limits in relationship with husband  Plan: Return again in 2  weeks.  Diagnosis: Axis I: Bipolar Disorder    Axis II: Borderline Personality Dis.    [name], LCSW 10/01/2019

## 2019-10-15 ENCOUNTER — Other Ambulatory Visit: Payer: Self-pay

## 2019-10-15 ENCOUNTER — Ambulatory Visit (INDEPENDENT_AMBULATORY_CARE_PROVIDER_SITE_OTHER): Payer: Medicare Other | Admitting: Psychiatry

## 2019-10-15 DIAGNOSIS — F319 Bipolar disorder, unspecified: Secondary | ICD-10-CM | POA: Diagnosis not present

## 2019-10-15 NOTE — Progress Notes (Signed)
Virtual Visit via Video Note  I connected with Susan Ball on 10/15/19 at 10:07 AM EDT  by a video enabled telemedicine application and verified that I am speaking with the correct person using two identifiers.   I discussed the limitations of evaluation and management by telemedicine and the availability of in person appointments. The patient expressed understanding and agreed to proceed.  I provided 53 minutes of non-face-to-face time during this encounter.   [name], LCSW  THERAPIST PROGRESS NOTE  Location:  Patient- Home/Provider - [facility] Outpatient [place] office            Session Time: Wednesday 10/15/2019 10:07 AM - 11:00 AM   Participation Level: Active  Behavioral Response: CasualAlert/ depressed/tearful  Type of Therapy: Individual Therapy  Treatment Goals addressed: Alleviate symptoms of depression/resume normal interest in activities and socialization  Interventions: CBT and Supportive  Summary: Susan Ball is a 49 y.o. female who p is referred for services by [name]. She just completed IOP on March 27, 2019. She has long standing history of bipolar disorder and borderline personality disorder. She has had 3 psychiatric hospitalizations. The last was in 2010 at [facility] due to suicide attempt. She participated in outpatient therapy intermittently at [facility] for 3 years. She last was seen there 6 months ago.  Current symptoms include depressed mood, crying spells, mood swings, aggression, anger, excessive worry, sleep difficulty, poor concentration, and memory difficulty.  Patient was seen via virtual visit about 2 weeks ago.  She reports continued  depressed mood and tearfulness. Triggers remain conflict with husband and 63 yo son recently moving in with his girl friend. Patient expresses sadness and frustration her relationship with her son is not as close as it once was.  She also expresses disappointment son is making decisions of which she disapproves.   She also fears negative consequences for her son.  Patient reports decreased interest in activities, feeling overwhelmed, and tearfulness.  She makes statements of feeling all alone and reports having suicidal ideations with a vague plan 2 times last week.  She reports she refrained and used weed instead to calm self.   Suicidal/Homicidal: Nowithout intent/plan .Marland Kitchen Patient agrees to call this practice, call 911, or have someone take her to the ER should symptoms worsen  Therapist Response:  reviewed symptoms, discussed stressors and identified triggers of increased depressed mood, facilitated expression of thoughts and feelings, validated feelings, assisted patient examine her pattern in responding to distressful emotions, began to discuss ways to improve emotion regulation, discussed emotional mind/wise mind/reasonable mind and her experience with each in this current situation, assisted patient identify ways to use her spirituality to identify coping statements, develop plan with patient to read coping statements daily, also developed plan with patient to resume attendance at the gym  Plan: Return again in 2  weeks.  Diagnosis: Axis I: Bipolar Disorder    Axis II: Borderline Personality Dis.    [name], LCSW 10/15/2019

## 2019-10-22 ENCOUNTER — Telehealth (HOSPITAL_COMMUNITY): Payer: Self-pay | Admitting: *Deleted

## 2019-10-22 DIAGNOSIS — F319 Bipolar disorder, unspecified: Secondary | ICD-10-CM

## 2019-10-22 MED ORDER — LITHIUM CARBONATE ER 450 MG PO TBCR: EXTENDED_RELEASE_TABLET | ORAL | 2 refills | Status: DC

## 2019-10-22 NOTE — Telephone Encounter (Signed)
Opened by mistake.

## 2019-10-22 NOTE — Telephone Encounter (Signed)
Done

## 2019-10-22 NOTE — Telephone Encounter (Signed)
Patient called and stated that she was out of Lithium.  Record checked and she was only given a 1 month supply.  Her next appt is 11/4. Please review and advise.

## 2019-10-29 ENCOUNTER — Ambulatory Visit (INDEPENDENT_AMBULATORY_CARE_PROVIDER_SITE_OTHER): Payer: Medicare Other | Admitting: Psychiatry

## 2019-10-29 ENCOUNTER — Other Ambulatory Visit: Payer: Self-pay

## 2019-10-29 DIAGNOSIS — F319 Bipolar disorder, unspecified: Secondary | ICD-10-CM

## 2019-10-29 NOTE — Progress Notes (Signed)
Virtual Visit via Video Note  I connected with Susan Ball on 10/29/19 at 10:08 AM EDT  by a video enabled telemedicine application and verified that I am speaking with the correct person using two identifiers.   I discussed the limitations of evaluation and management by telemedicine and the availability of in person appointments. The patient expressed understanding and agreed to proceed.  I provided 46 minutes of non-face-to-face time during this encounter.   [name], LCSW   THERAPIST PROGRESS NOTE  Location:  Patient- Home/Provider - [facility] Outpatient [place] office            Session Time: Wednesday 10/29/2019 10:07 AM - 10:53 AM   Participation Level: Active  Behavioral Response: CasualAlert/ depressed/tearful  Type of Therapy: Individual Therapy  Treatment Goals addressed: Patient states wanting to stop being so depressed all the time, wanting to work on her attitude/ Alleviate symptoms of depression,resume normal interest in activities and socialization  Interventions: CBT and Supportive  Summary: MARAKI MACQUARRIE is a 49 y.o. female who p is referred for services by [name]. She just completed IOP on March 27, 2019. She has long standing history of bipolar disorder and borderline personality disorder. She has had 3 psychiatric hospitalizations. The last was in 2010 at [facility] due to suicide attempt. She participated in outpatient therapy intermittently at [facility] for 3 years. She last was seen there 6 months ago.  Current symptoms include depressed mood, crying spells, mood swings, aggression, anger, excessive worry, sleep difficulty, poor concentration, and memory difficulty.  Patient was seen via virtual visit about 2 weeks ago.  She reports continued depressed mood and tearfulness.  She also reports increased worry.  However, she reports using coping statements  doing well the first few days after last session.  But, she states other things started to happen  including issues with her son, problems with her ex-husband, stress on her job, and incident with husband getting drunk.  She reports later setting limits with husband regarding his alcohol use but still being very angry, frustrated, and hurt by his behavior.  She reports feeling overwhelmed and thoughts of why the bad things keep happening, feelings of helplessness and hopelessness.  Patient has remained involved in activities and also has been attending gym.  She reports that she has refrained from using marijuana for the past 2 weeks and has been trying to distract herself as well as having time alone to cope  Suicidal/Homicidal: Nowithout intent/plan .Marland Kitchen Patient agrees to call this practice, call 911, or have someone take her to the ER should symptoms worsen  Therapist Response:  reviewed symptoms, praised and reinforced patient's use of helpful coping strategies and her efforts to refrain from marijuana use, discussed stressors, facilitated expression of thoughts and feelings, validated feelings, reviewed concepts related to wise mind, also discussed self soothing, developed  plan to review and use self soothing techniques discussed in DBT, reviewed connection between thoughts/mood/behavior using examples in patient's life, assisted patient identify coping statements using her spirituality, also encouraged patient to discuss spiritual questions with her pastor, reviewed anxiety/stress response and ways to trigger relaxation response, will send patient handout on relaxation techniques, encouraged patient to practice daily and to continuing attending gym, also discussed possible script to express concerns to husband using I messages  Plan: Return again in 2  weeks.  Diagnosis: Axis I: Bipolar Disorder    Axis II: Borderline Personality Dis.    [name], LCSW 10/29/2019

## 2019-11-12 ENCOUNTER — Other Ambulatory Visit: Payer: Self-pay

## 2019-11-12 ENCOUNTER — Ambulatory Visit (INDEPENDENT_AMBULATORY_CARE_PROVIDER_SITE_OTHER): Payer: Medicare Other | Admitting: Psychiatry

## 2019-11-12 DIAGNOSIS — F319 Bipolar disorder, unspecified: Secondary | ICD-10-CM

## 2019-11-12 NOTE — Progress Notes (Signed)
Virtual Visit via Video Note  I connected with Susan Ball on 11/12/19 at 10:10 AM  by a video enabled telemedicine application and verified that I am speaking with the correct person using two identifiers.  Location: Patient: Home Provider: [facility] Out[facility] - [place] office    I discussed the limitations of evaluation and management by telemedicine and the availability of in person appointments. The patient expressed understanding and agreed to proceed.  I provided 46  minutes of non-face-to-face time during this encounter.   [name], LCSW   THERAPIST PROGRESS NOTE  Location:  Patient- Home/Provider - [facility] Outpatient [place] office            Session Time: Wednesday 11/12/2019 10:10 AM  - 10:56 AM   Participation Level: Active  Behavioral Response: CasualAlert/less depressed, anxious  Type of Therapy: Individual Therapy  Treatment Goals addressed: Patient states wanting to stop being so depressed all the time, wanting to work on her attitude/ Alleviate symptoms of depression,resume normal interest in activities and socialization  Interventions: CBT and Supportive  Summary: Susan Ball is a 49 y.o. female who p is referred for services by [name]. She just completed IOP on March 27, 2019. She has long standing history of bipolar disorder and borderline personality disorder. She has had 3 psychiatric hospitalizations. The last was in 2010 at [facility] due to suicide attempt. She participated in outpatient therapy intermittently at [facility] for 3 years. She last was seen there 6 months ago.  Current symptoms include depressed mood, crying spells, mood swings, aggression, anger, excessive worry, sleep difficulty, poor concentration, and memory difficulty.  Patient was seen via virtual visit about 2 weeks ago.  She reports less depressed mood and increased behavioral activation including attendance at the gym and working since last session.   She also recently attended  the state fair.  She reports increased stress regarding issues with her ex-husband and son but coping better.  She has been using her spirituality and coping statements.  She expresses feelings of guilt regarding situation between her son and her ex-husband.  She reports she did have one meltdown since last session and says this occurred yesterday at the gym.  She expresses remorse as she was verbally aggressive with another attendee.  She is pleased with her use of assertiveness skills in the relationship with her husband and reports they now have improved interaction.  Patient reports she did not receive previously mailed handouts as she is having difficulty receiving mail at the address on file.  She is planning to get a new mailing address and will inform office staff.   Suicidal/Homicidal: Nowithout intent/plan .  Therapist Response:  reviewed symptoms, praised and reinforced patient's use of helpful coping strategies/assertiveness skills, discussed effects, discussed stressors, facilitated expression of thoughts and feelings validated feelings, assisted patient identify helpful alternatives that she could have used and the situation at the gym yesterday, assisted patient identify her thought patterns (should/could, minimization, magnification) that promote inappropriate guilt and replace with helpful alternatives to dispel inappropriate guilt, developed plan with patient to use replacement thoughts,   Plan: Return again in 2  weeks.  Diagnosis: Axis I: Bipolar Disorder    Axis II: Borderline Personality Dis.    [name], LCSW 11/12/2019

## 2019-11-25 ENCOUNTER — Other Ambulatory Visit: Payer: Self-pay

## 2019-11-25 ENCOUNTER — Ambulatory Visit (INDEPENDENT_AMBULATORY_CARE_PROVIDER_SITE_OTHER): Payer: Medicare Other | Admitting: Psychiatry

## 2019-11-25 DIAGNOSIS — F319 Bipolar disorder, unspecified: Secondary | ICD-10-CM

## 2019-11-25 DIAGNOSIS — F419 Anxiety disorder, unspecified: Secondary | ICD-10-CM

## 2019-11-25 NOTE — Progress Notes (Signed)
Virtual Visit via Video Note  I connected with Susan Ball on 11/25/19 at 1:11 PM EDT  by a video enabled telemedicine application and verified that I am speaking with the correct person using two identifiers.  Location: Patient: Car Provider: [facility] Outpatient [place] office    I discussed the limitations of evaluation and management by telemedicine and the availability of in person appointments. The patient expressed understanding and agreed to proceed.  I provided 55 minutes of non-face-to-face time during this encounter.   [name], LCSW  THERAPIST PROGRESS NOTE  Location:  Patient- Car [facility] Outpatient [place] office            Session Time: Tuesday 11/25/2019 1:11 PM -  2:06 PM   Participation Level: Active  Behavioral Response: CasualAlert/depressed, anxious, tearful  Type of Therapy: Individual Therapy  Treatment Goals addressed: Patient states wanting to stop being so depressed all the time, wanting to work on her attitude/ Alleviate symptoms of depression,resume normal interest in activities and socialization  Interventions: CBT and Supportive  Summary: Susan Ball is a 49 y.o. female who p is referred for services by [name]. She just completed IOP on March 27, 2019. She has long standing history of bipolar disorder and borderline personality disorder. She has had 3 psychiatric hospitalizations. The last was in 2010 at [facility] due to suicide attempt. She participated in outpatient therapy intermittently at [facility] for 3 years. She last was seen there 6 months ago.  Current symptoms include depressed mood, crying spells, mood swings, aggression, anger, excessive worry, sleep difficulty, poor concentration, and memory difficulty.  Patient was seen via virtual visit about 2 weeks ago.  She reports increased stress and depressed mood.  She reports feeling overwhelmed as she attended a legal meeting this morning with her ex husband regarding  settling property.  She expresses frustration as she learned she may need her current husband's signature on documents as the property in question was not settled prior to her current marriage.  She expresses fear and frustration husband will become upset.  She is overwhelmed by this.  She also reports continued stress regarding her ex-husband and her son.  She reports difficulty calming self and is very distraught in session initially. She reports medication compliance.  She continues to attend work and go to the gym but reports decreased interest and involvement in other activities. She denies suicidal ideations but does ask about possible hospitalization should she become more overwhelmed.   Suicidal/Homicidal: Patient denies current suicidal ideations but does report feeling overwhelmed.  She agrees to call 911 or have someone take her to the ED should symptoms worsen.  She also agrees to keep upcoming scheduled appointment with psychiatrist Dr. Lolly Mustache on 11/27/2019.  Therapist Response:  reviewed symptoms, discussed stressors, facilitated expression of thoughts and feelings, validated feelings, assisted patient practice relaxation technique to regain composure in session, assisted patient identify activities to improve distress tolerance skills, assisted patient identify strengths and techniques she has used previously to manage stress and adversity, will send patient handout on distress tolerance skills via mail.  Plan: Return again in 2  weeks.  Diagnosis: Axis I: Bipolar Disorder    Axis II: Borderline Personality Dis.    [name], LCSW 11/25/2019

## 2019-11-27 ENCOUNTER — Other Ambulatory Visit (HOSPITAL_COMMUNITY): Payer: Self-pay | Admitting: *Deleted

## 2019-11-27 ENCOUNTER — Other Ambulatory Visit: Payer: Self-pay

## 2019-11-27 ENCOUNTER — Telehealth (INDEPENDENT_AMBULATORY_CARE_PROVIDER_SITE_OTHER): Payer: Medicare Other | Admitting: Psychiatry

## 2019-11-27 ENCOUNTER — Encounter (HOSPITAL_COMMUNITY): Payer: Self-pay | Admitting: Psychiatry

## 2019-11-27 DIAGNOSIS — F319 Bipolar disorder, unspecified: Secondary | ICD-10-CM | POA: Diagnosis not present

## 2019-11-27 DIAGNOSIS — F6089 Other specific personality disorders: Secondary | ICD-10-CM | POA: Diagnosis not present

## 2019-11-27 DIAGNOSIS — F419 Anxiety disorder, unspecified: Secondary | ICD-10-CM | POA: Diagnosis not present

## 2019-11-27 MED ORDER — FLUOXETINE HCL 20 MG PO CAPS: 20.0000 mg | ORAL_CAPSULE | Freq: Every day | ORAL | 0 refills | Status: DC

## 2019-11-27 MED ORDER — FLUOXETINE HCL 40 MG PO CAPS: 40.0000 mg | ORAL_CAPSULE | Freq: Every day | ORAL | 0 refills | Status: DC

## 2019-11-27 MED ORDER — CLONIDINE HCL 0.1 MG PO TABS: 0.1000 mg | ORAL_TABLET | Freq: Every day | ORAL | 0 refills | Status: DC

## 2019-11-27 MED ORDER — LITHIUM CARBONATE ER 450 MG PO TBCR: EXTENDED_RELEASE_TABLET | ORAL | 2 refills | Status: DC

## 2019-11-27 NOTE — Progress Notes (Signed)
Virtual Visit via Video Note  I connected with Susan Ball on 11/27/19 at 11:40 AM EDT by a video enabled telemedicine application and verified that I am speaking with the correct person using two identifiers.  Location: Patient: home Provider: home office   I discussed the limitations of evaluation and management by telemedicine and the availability of in person appointments. The patient expressed understanding and agreed to proceed.  History of Present Illness: Patient is evaluated by video session.  She is taking her medication as prescribed.  We started increased BuSpar on the last visit but she noticed it is making her more irritable and angry.  She does not want to take the BuSpar.  Overall she feels things are going well.  She has a better relationship with her husband.  Her job is going well.  She is working as an Public house manager.  She denies any tremors, shakes or any EPS.  She denies any mania, psychosis or any hallucination.  She is in therapy with Dorita Fray that is going well.  She is trying to lose weight and she had lost 10 pounds since the last visit.  She does power lifting and treadmill.  She denies any recent self harming behavior.  She like to continue Prozac, lithium, clonidine.  Ready level is okay.  Past Psychiatric History: H/O Bipolar, anxiety and borderline traits. Taking medssince age 49.H/O multiple inpatient and suicidal attempt.H/Ooverdose, jump from the car and banging head. Tried Lamictal(rash),Seroquel, Abilify,doxepin, Trazadone and hydroxyzine. H/O n/c with meds,refusing to eat and needed PICC line.H/O anger, road rage, paranoia and trust issues. Had DBT with good response.   Psychiatric Specialty Exam: Physical Exam  Review of Systems  Weight 248 lb (112.5 kg).There is no height or weight on file to calculate BMI.  General Appearance: Casual  Eye Contact:  Fair  Speech:  Normal Rate  Volume:  Normal  Mood:  Euthymic  Affect:  Congruent  Thought  Process:  Goal Directed  Orientation:  Full (Time, Place, and Person)  Thought Content:  Logical  Suicidal Thoughts:  No  Homicidal Thoughts:  No  Memory:  Immediate;   Good Recent;   Good Remote;   Good  Judgement:  Intact  Insight:  Present  Psychomotor Activity:  Normal  Concentration:  Concentration: Good and Attention Span: Good  Recall:  Good  Fund of Knowledge:  Good  Language:  Good  Akathisia:  No  Handed:  Right  AIMS (if indicated):     Assets:  Communication Skills Desire for Improvement Housing Physical Health Resilience Social Support Talents/Skills Transportation  ADL's:  Intact  Cognition:  WNL  Sleep:   ok      Assessment and Plan: Bipolar disorder type I.  Anxiety.  Borderline traits/cluster B.  Discontinue BuSpar since patient noticed more irritability with the BuSpar.  She is comfortable with her other medication.  Encouraged to continue therapy with [name].  We will do lithium level since last level was done antibiotic.  Continue Prozac 40 mg and Prozac 20 mg daily, clonidine 0.1 mg at bedtime and lithium 450 mg twice a day.  Discussed medication side effects and benefits.  Encouraged healthy lifestyle.  Recommended to call us back if she has any question or any concern.  Follow-up in 3 months.  Follow Up Instructions:    I discussed the assessment and treatment plan with the patient. The patient was provided an opportunity to ask questions and all were answered. The patient agreed with the  plan and demonstrated an understanding of the instructions.   The patient was advised to call back or seek an in-person evaluation if the symptoms worsen or if the condition fails to improve as anticipated.  I provided 17 minutes of non-face-to-face time during this encounter.   Cleotis Nipper, MD

## 2019-11-29 LAB — LITHIUM LEVEL: Lithium Lvl: 0.8 mmol/L (ref 0.5–1.2)

## 2019-12-09 ENCOUNTER — Other Ambulatory Visit (HOSPITAL_COMMUNITY): Payer: Self-pay | Admitting: Psychiatry

## 2019-12-09 ENCOUNTER — Ambulatory Visit (INDEPENDENT_AMBULATORY_CARE_PROVIDER_SITE_OTHER): Payer: Medicare Other | Admitting: Psychiatry

## 2019-12-09 ENCOUNTER — Other Ambulatory Visit: Payer: Self-pay

## 2019-12-09 DIAGNOSIS — F319 Bipolar disorder, unspecified: Secondary | ICD-10-CM

## 2019-12-09 DIAGNOSIS — F419 Anxiety disorder, unspecified: Secondary | ICD-10-CM

## 2019-12-09 NOTE — Progress Notes (Signed)
Virtual Visit via Video Note  I connected with Susan Ball on 12/09/19 at 11:05 AM EST by a video enabled telemedicine application and verified that I am speaking with the correct person using two identifiers.  Location: Patient: Home Provider: [facility] Outpatient [place] office    I discussed the limitations of evaluation and management by telemedicine and the availability of in person appointments. The patient expressed understanding and agreed to proceed.  I provided 51 minutes of non-face-to-face time during this encounter.   [name], LCSW  THERAPIST PROGRESS NOTE  Location:  Patient- Car [facility]  office            Session Time: Tuesday 12/08/2019 11:05 AM  - 11:56 AM   Participation Level: Active  Behavioral Response: CasualAlert/depressed, anxious, tearful  Type of Therapy: Individual Therapy  Treatment Goals addressed: Patient states wanting to stop being so depressed all the time, wanting to work on her attitude/ Alleviate symptoms of depression,resume normal interest in activities and socialization  Interventions: CBT and Supportive  Summary: CHEZNEY HUETHER is a 49 y.o. female who p is referred for services by [name]. She just completed IOP on March 27, 2019. She has long standing history of bipolar disorder and borderline personality disorder. She has had 3 psychiatric hospitalizations. The last was in 2010 at [facility] due to suicide attempt. She participated in outpatient therapy intermittently at [facility] for 3 years. She last was seen there 6 months ago.  Current symptoms include depressed mood, crying spells, mood swings, aggression, anger, excessive worry, sleep difficulty, poor concentration, and memory difficulty.  Patient was seen via virtual visit about 2 weeks ago.  She reports experiencing increased stressed and depressed mood a few days ago as she feared her husband was going out of state without her and felt rejected.   She reports experiencing fleeting suicidal ideations and reports her husband now has her gun.  She states feeling better now and says she realizes she was mistaken about husband's plans.  She admits a pattern of thinking and fearing husband will leave her although he has given her no indication that he plans on doing this.  She states he is a good husband.  She reports she can think more rationally about situations at times but has difficulty slowing down.  She reports sometimes being impulsive.  She also reports increased irritability.  Suicidal/Homicidal: Patient denies current suicidal ideations.  She agrees to call 911 or have someone take her to the ED should symptoms worsen.   Therapist Response:  reviewed symptoms, discussed stressors, facilitated expression of thoughts and feelings, validated feelings, discussed talking with psychiatrist Dr. Lolly Mustache regarding symptoms and medication, patient agreed to contact Dr. Lolly Mustache regarding her concerns, discussed DBT principles regarding wise mind/emotional mind/reasonable mind, also assisted patient identify ways to improve distress tolerance skills using ACCEPTS, developed plan with patient to complete and use ACCEPTS handout to cope with distressful feelings  Plan: Return again in 2  weeks.  Diagnosis: Axis I: Bipolar Disorder    Axis II: Borderline Personality Dis.    [name], LCSW 12/09/2019

## 2019-12-11 ENCOUNTER — Telehealth (HOSPITAL_COMMUNITY): Payer: Self-pay | Admitting: *Deleted

## 2019-12-11 NOTE — Telephone Encounter (Signed)
Pt called c/o racing thoughts, poor concentration, mania and out of control, impulsiveness, decreased energy. Pt states that her mind is racing but body feels exhausted. Pt also says that she has intermittent si, at times with a plan which she was unable to disclose as she was at work. Pt cannot go to [facility] as she lives in [place]. But was encouraged to go to ED or call 911 if suicidal. Sounds like pt does not want to go inpt. Pt next appointment on 02/25/20. Please review and advise.

## 2019-12-11 NOTE — Telephone Encounter (Signed)
She need to go ER for evaluation,blood work including lithium level. She should cut down Prozac to only 20 mg.

## 2019-12-12 ENCOUNTER — Telehealth (HOSPITAL_COMMUNITY): Payer: Self-pay | Admitting: Psychiatry

## 2019-12-12 DIAGNOSIS — F319 Bipolar disorder, unspecified: Secondary | ICD-10-CM

## 2019-12-12 MED ORDER — LITHIUM CARBONATE ER 450 MG PO TBCR: EXTENDED_RELEASE_TABLET | ORAL | 0 refills | Status: DC

## 2019-12-12 NOTE — Telephone Encounter (Signed)
I talked with the patient this morning.  She admitted that she is not telling the truth in her sessions.  She has been having a lot of anger, irritability and mood swings.  She feels her lithium is not strong enough and needs to go up.  She endorsed passive and fleeting suicidal thoughts but today is a good day.  Usually clonidine helps her sleep.  We talked about adjusting the medication and she agreed to give a higher dose of lithium.  We also talked about adding low-dose either Vraylar or Rexulti but patient feel adding extra lithium will help her a lot.  She also agreed to move her appointment sooner in first or second week of December.  She also agree that she will be open with her therapist as she is seeing [name] and agree if her suicidal thoughts started to get worse with active plans and she will call us immediately or go to the local emergency room.  I will call the new prescription of lithium to her local pharmacy and she agreed to take 1 in the morning and 2 at bedtime.  Her last lithium level was 0.8 which was done recently.  We will do repeat level in few weeks.

## 2019-12-24 ENCOUNTER — Ambulatory Visit (INDEPENDENT_AMBULATORY_CARE_PROVIDER_SITE_OTHER): Payer: Medicare Other | Admitting: Psychiatry

## 2019-12-24 ENCOUNTER — Other Ambulatory Visit: Payer: Self-pay

## 2019-12-24 DIAGNOSIS — F319 Bipolar disorder, unspecified: Secondary | ICD-10-CM | POA: Diagnosis not present

## 2019-12-24 NOTE — Progress Notes (Signed)
Virtual Visit via Video Note  I connected with Susan Ball on 12/24/19 at 10:09 AM EST by a video enabled telemedicine application and verified that I am speaking with the correct person using two identifiers.  Location: Patient: Home Provider: [facility] Outpatient [place] office    I discussed the limitations of evaluation and management by telemedicine and the availability of in person appointments. The patient expressed understanding and agreed to proceed.   I provided 48 minutes of non-face-to-face time during this encounter.   [name], LCSW  THERAPIST PROGRESS NOTE  Location:  Patient- Car [facility] Outpatient [place] office            Session Time: Wednesday 12/24/2019 10:09 AM - 10:57 AM   Participation Level: Active  Behavioral Response: CasualAlert/depressed, anxious, tearful  Type of Therapy: Individual Therapy  Treatment Goals addressed: Patient states wanting to stop being so depressed all the time, wanting to work on her attitude/ Alleviate symptoms of depression,resume normal interest in activities and socialization  Interventions: CBT and Supportive  Summary: Susan Ball is a 49 y.o. female who p is referred for services by [name]. She just completed IOP on March 27, 2019. She has long standing history of bipolar disorder and borderline personality disorder. She has had 3 psychiatric hospitalizations. The last was in 2010 at [facility] due to suicide attempt. She participated in outpatient therapy intermittently at [facility] for 3 years. She last was seen there 6 months ago.  Current symptoms include depressed mood, crying spells, mood swings, aggression, anger, excessive worry, sleep difficulty, poor concentration, and memory difficulty.  Patient was seen via virtual visit about 2 weeks ago.  She reports having truthful conversation with psychiatrist Dr. Lolly Mustache about her behavior and the medication.  She reports decreased irritability, decreased  anger outbursts, and decreased racing thoughts since taking increased dosage of lithium as instructed by psychiatrist Dr. Lolly Mustache.  She also reports decreased suicidal thoughts and reports she has had fleeting suicidal ideations once since medication increase.  She coped with this by using self talk and leaving her home.  She denies current suicidal ideations.  She reports continuing to work and attend the gym.  She also has started attending a Zumba class 2 times per week and reports this is very enjoyable and helps her manage stress.  She continues to experience thoughts of "I have nothing, I have nobody" and reports distressful feelings associated with these thoughts.    Suicidal/Homicidal: Patient denies current suicidal ideations.  She agrees to call 911 or have someone take her to the ED should symptoms worsen.   Therapist Response:  reviewed symptoms, praised and reinforced patient's efforts regarding expressing concerns and being honest with psychiatrist Dr. Lolly Mustache, discussed the effects, discussed changes patient has noticed since taking the medication, praised and reinforced patient's efforts to use helpful coping strategies, reviewed ACCEPTS handout and assisted patient identify strategies to cope with distressful feelings, praised and reinforced patient's increased efforts to improve self-care and involvement in activities   Plan: Return again in 2  weeks.  Diagnosis: Axis I: Bipolar Disorder    Axis II: Borderline Personality Dis.    [name], LCSW 12/24/2019

## 2019-12-25 ENCOUNTER — Other Ambulatory Visit (HOSPITAL_COMMUNITY): Payer: Self-pay | Admitting: *Deleted

## 2019-12-25 ENCOUNTER — Other Ambulatory Visit: Payer: Self-pay

## 2019-12-25 ENCOUNTER — Telehealth (INDEPENDENT_AMBULATORY_CARE_PROVIDER_SITE_OTHER): Payer: Medicare Other | Admitting: Psychiatry

## 2019-12-25 DIAGNOSIS — F419 Anxiety disorder, unspecified: Secondary | ICD-10-CM | POA: Diagnosis not present

## 2019-12-25 DIAGNOSIS — F6089 Other specific personality disorders: Secondary | ICD-10-CM

## 2019-12-25 DIAGNOSIS — F319 Bipolar disorder, unspecified: Secondary | ICD-10-CM | POA: Diagnosis not present

## 2019-12-25 MED ORDER — FLUOXETINE HCL 20 MG PO CAPS: 20.0000 mg | ORAL_CAPSULE | Freq: Every day | ORAL | 0 refills | Status: DC

## 2019-12-25 MED ORDER — CLONIDINE HCL 0.1 MG PO TABS: 0.1000 mg | ORAL_TABLET | Freq: Every day | ORAL | 0 refills | Status: DC

## 2019-12-25 MED ORDER — LITHIUM CARBONATE ER 450 MG PO TBCR: EXTENDED_RELEASE_TABLET | ORAL | 0 refills | Status: DC

## 2019-12-25 MED ORDER — FLUOXETINE HCL 40 MG PO CAPS: 40.0000 mg | ORAL_CAPSULE | Freq: Every day | ORAL | 0 refills | Status: DC

## 2019-12-25 NOTE — Progress Notes (Signed)
Virtual Visit via Video Note  I connected with Susan Ball on 12/25/19 at 11:00 AM EST by a video enabled telemedicine application and verified that I am speaking with the correct person using two identifiers.  Location: Patient: In Parking Lot Provider: Home Office   I discussed the limitations of evaluation and management by telemedicine and the availability of in person appointments. The patient expressed understanding and agreed to proceed.  History of Present Illness: Patient is evaluated by video session.  She is in the car by herself.  Last month she called and told that she is not telling the truth in her sessions.  She admitted having a lot of anger, irritability and mood swings and having some time passive and fleeting suicidal thoughts.  She feels her impulsivity and manic cycles are less intense from the past.  We have recommended to increase lithium as patient was not interested to try any other medication.  She noticed much improvement in her mood, irritability, sleep.  She denies any severe anger and her suicidal thoughts are subsided.  She started therapy with [name] and that is helping her.  She still have trust and paranoia and she is hoping therapy will help her.  She had a good Thanksgiving.  Her 70 year old son moved out 2 months ago.  She lives with her husband who is supportive.  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  She tried to keep herself busy at work.  She is tolerating lithium without any side effects.  She is taking clonidine which is helping her sleep and racing thoughts.  She is taking Prozac 60 mg and we have discussed to cut down the dose but she is comfortable with 60 mg.  She is not engaging in any self abusive behavior.  Her appetite is okay and her weight is unchanged from the past.  Past Psychiatric History: H/O Bipolar, anxiety and borderline traits. Taking medssince age 36.H/O multiple inpatient and suicidal  attempt.H/Ooverdose, jump from the car and banging head. Tried Lamictal(rash),Seroquel, Abilify,doxepin, Trazadone and hydroxyzine.H/O n/c with meds,refusing to eat and needed PICC line.H/O anger, road rage, paranoia and trust issues. Had DBT with good response.   Psychiatric Specialty Exam: Physical Exam  Review of Systems  There were no vitals taken for this visit.There is no height or weight on file to calculate BMI.  General Appearance: Casual  Eye Contact:  Fair  Speech:  Slow  Volume:  Decreased  Mood:  Euthymic  Affect:  Congruent  Thought Process:  Descriptions of Associations: Intact  Orientation:  Full (Time, Place, and Person)  Thought Content:  trust and paranoia but no delusion  Suicidal Thoughts:  No  Homicidal Thoughts:  No  Memory:  Immediate;   Good Recent;   Good Remote;   Good  Judgement:  Intact  Insight:  Present  Psychomotor Activity:  Normal  Concentration:  Concentration: Good and Attention Span: Good  Recall:  Good  Fund of Knowledge:  Good  Language:  Good  Akathisia:  No  Handed:  Right  AIMS (if indicated):     Assets:  Communication Skills Desire for Improvement Housing Talents/Skills  ADL's:  Intact  Cognition:  WNL  Sleep:   Better      Assessment and Plan: Bipolar disorder type I.  Anxiety.  Cluster B traits  Patient is no longer taking BuSpar and taking the lithium.  We have increased lithium 450 mg in the  morning and 9 mg at bedtime.  She noticed much improvement, irritability, mania or anger.  Encouraged to continue therapy with [name].  We will do lithium level.  Her last level was 0.8.  Patient does not want to change the medication.  Continue clonidine 0.1 mg at bedtime, lithium 450 in the morning and 9 mg at bedtime and Prozac 60 mg daily.  Discussed safety concerns and anytime having active suicidal thoughts or homicidal thought that she need to call 911 or go to local emergency room.  Follow-up in 2  months.  Follow Up Instructions:    I discussed the assessment and treatment plan with the patient. The patient was provided an opportunity to ask questions and all were answered. The patient agreed with the plan and demonstrated an understanding of the instructions.   The patient was advised to call back or seek an in-person evaluation if the symptoms worsen or if the condition fails to improve as anticipated.  I provided 21 minutes of non-face-to-face time during this encounter.   Cleotis Nipper, MD

## 2019-12-26 ENCOUNTER — Other Ambulatory Visit (HOSPITAL_COMMUNITY): Payer: Self-pay | Admitting: *Deleted

## 2019-12-26 ENCOUNTER — Telehealth (HOSPITAL_COMMUNITY): Payer: Self-pay | Admitting: *Deleted

## 2019-12-26 DIAGNOSIS — F319 Bipolar disorder, unspecified: Secondary | ICD-10-CM

## 2019-12-26 LAB — LITHIUM LEVEL: Lithium Lvl: 1.4 mmol/L (ref 0.5–1.2)

## 2019-12-26 NOTE — Telephone Encounter (Signed)
Pt returned writer's call and states that she did take her Lithium prior to having blood work done. I reordered another test if needed.

## 2019-12-26 NOTE — Telephone Encounter (Signed)
Lithium level results received form [facility]. lithium level is 1.4 as of 12/25/19.

## 2019-12-26 NOTE — Telephone Encounter (Signed)
Thanks

## 2019-12-26 NOTE — Telephone Encounter (Signed)
Thanks for update

## 2019-12-26 NOTE — Telephone Encounter (Signed)
Writer called pt regarding Lithium level, and to ask if pt had taken her Lithium prior to having bodywork done. Will update.had to leave a VM.

## 2019-12-31 ENCOUNTER — Telehealth (HOSPITAL_COMMUNITY): Payer: Self-pay | Admitting: *Deleted

## 2019-12-31 LAB — LITHIUM LEVEL: Lithium Lvl: 1.3 mmol/L (ref 0.5–1.2)

## 2019-12-31 NOTE — Telephone Encounter (Signed)
She had a high level with lithium 1350 mg a day.  Now she is taking 900 mg a day.  We can try 1200 and repeat the level.  If she agree we can add 300 mg with her current lithium 450 mg twice a day.  However she need to get level after few days.

## 2019-12-31 NOTE — Telephone Encounter (Signed)
Pt returned nurses call regarding decreasing Lithium. Pt is very concerned as she says the last episode was the "worst ever". Pt is afraid she will have another manic episode. FYI. Nurse reinforced why dosage was decreased and side effects that can (will) occur if Lithium level too high.

## 2019-12-31 NOTE — Telephone Encounter (Signed)
Lithium level received form [facility]. Resulting level 1.3. prior to having repeat lab drawn this nurse spoke with pt to assure that she did not take her medication to lab draw and she verbalized understanding. Please review.

## 2019-12-31 NOTE — Telephone Encounter (Signed)
She need to cut down the dose to Twice a day.

## 2019-12-31 NOTE — Progress Notes (Signed)
She need to cut down her lithium dose to twice a day

## 2020-01-07 ENCOUNTER — Ambulatory Visit (INDEPENDENT_AMBULATORY_CARE_PROVIDER_SITE_OTHER): Payer: Medicare Other | Admitting: Psychiatry

## 2020-01-07 ENCOUNTER — Other Ambulatory Visit: Payer: Self-pay

## 2020-01-07 DIAGNOSIS — F419 Anxiety disorder, unspecified: Secondary | ICD-10-CM

## 2020-01-07 DIAGNOSIS — F319 Bipolar disorder, unspecified: Secondary | ICD-10-CM | POA: Diagnosis not present

## 2020-01-07 NOTE — Progress Notes (Signed)
Virtual Visit via Telephone Note  I connected with Susan Ball on 01/07/20 at 4;10 PM by telephone and verified that I am speaking with the correct person using two identifiers.  Location: Patient: Home Provider: [facility]patient [place] office    I discussed the limitations, risks, security and privacy concerns of performing an evaluation and management service by telephone and the availability of in person appointments. I also discussed with the patient that there may be a patient responsible charge related to this service. The patient expressed understanding and agreed to proceed.   I provided 45 minutes of non-face-to-face time during this encounter.   Susan Salvage, LCSW  THERAPIST PROGRESS NOTE  Location:  Patient- [facility]  office            Session Time: Wednesday 01/07/2020 4:10 PM - 4:55 PM   Participation Level: Active  Behavioral Response: CasualAlert/depressed, anxious, tearful  Type of Therapy: Individual Therapy  Treatment Goals addressed: Patient states wanting to stop being so depressed all the time, wanting to work on her attitude/ Alleviate symptoms of depression,resume normal interest in activities and socialization  Interventions: CBT and Supportive  Summary: Susan Ball is a 49 y.o. female who p is referred for services by Susan Ball. She just completed IOP on March 27, 2019. She has long standing history of bipolar disorder and borderline personality disorder. She has had 3 psychiatric hospitalizations. The last was in 2010 at [facility] due to suicide attempt. She participated in outpatient therapy intermittently at [facility] for 3 years. She last was seen there 6 months ago.  Current symptoms include depressed mood, crying spells, mood swings, aggression, anger, excessive worry, sleep difficulty, poor concentration, and memory difficulty.  Patient was seen via virtual visit about 2 weeks ago.  She reports reducing dosage  of lithium per instructions from psychiatrist Dr. Lolly Mustache as patient had elevated lithium levels.  She expresses concern that her current lithium level may be having a negative effect.  She reports leaving a message on the nurse line last week and is waiting for response.  Per her report, she has been experiencing some manic behavior including "loud talking people", getting into arguments, and racing thoughts.  She reports no violence or road rage.  She reports having fleeting suicidal ideations once with no plan and no intent.  She denies current suicidal/homicidal ideations.  Suicidal/Homicidal: Patient denies current suicidal ideations.  She agrees to call 911 or have someone take her to the ED should symptoms worsen.   Therapist Response:  reviewed symptoms, developed plan with patient to contact Dr. Lolly Mustache tomorrow regarding medication concerns, assisted patient identify situations that tend to trigger manic behavior, assisted patient identify the consequences of engaging in impulsive behaviors, assisted patient develop plan to address triggering situations including ( avoidance of situations, developing plan with husband to accompany her to necessary activities like errands/be support person to help her avoid confrontations, walk away from situations), also discussed the importance of practicing relaxation techniques daily, discussed rationale for and assisted patient practice progressive muscle relaxation, developed plan for patient to practice a relaxation technique daily, will send patient handout on PMR, also discussed other activities including going to the gym/listening to music    Plan: Return again in 2  weeks.  Diagnosis: Axis I: Bipolar Disorder    Axis II: Borderline Personality Dis.    Susan Salvage, LCSW 01/07/2020

## 2020-01-10 ENCOUNTER — Ambulatory Visit: Payer: Medicare Other | Attending: Internal Medicine

## 2020-01-10 DIAGNOSIS — Z23 Encounter for immunization: Secondary | ICD-10-CM

## 2020-01-10 NOTE — Progress Notes (Signed)
   Covid-19 Vaccination Clinic  Name:  Susan Ball    MRN: [id number] DOB: 04-18-1970  01/10/2020  [name] was observed post Covid-19 immunization for 15 minutes without incident. She was provided with Vaccine Information Sheet and instruction to access the V-Safe system.   [name] was instructed to call 911 with any severe reactions post vaccine: Marland Kitchen Difficulty breathing  . Swelling of face and throat  . A fast heartbeat  . A bad rash all over body  . Dizziness and weakness   Immunizations Administered    Name Date Dose VIS Date Route   Pfizer COVID-19 Vaccine 01/10/2020 11:10 AM 0.3 mL 11/12/2019 Intramuscular   Manufacturer: [employer], [employer]   Lot: [id number]   NDC: [address]-2

## 2020-01-20 ENCOUNTER — Other Ambulatory Visit (HOSPITAL_COMMUNITY): Payer: Self-pay | Admitting: Psychiatry

## 2020-01-20 DIAGNOSIS — F319 Bipolar disorder, unspecified: Secondary | ICD-10-CM

## 2020-01-29 ENCOUNTER — Ambulatory Visit (INDEPENDENT_AMBULATORY_CARE_PROVIDER_SITE_OTHER): Payer: Medicare Other | Admitting: Psychiatry

## 2020-01-29 ENCOUNTER — Other Ambulatory Visit: Payer: Self-pay

## 2020-01-29 DIAGNOSIS — F319 Bipolar disorder, unspecified: Secondary | ICD-10-CM

## 2020-01-29 NOTE — Progress Notes (Signed)
Virtual Visit via Video Note  I connected with Susan Ball on 01/29/20 at 10:10 AM EST  by a video enabled telemedicine application and verified that I am speaking with the correct person using two identifiers.  Location: Patient: Home Provider: [facility] [place] office    I discussed the limitations of evaluation and management by telemedicine and the availability of in person appointments. The patient expressed understanding and agreed to proceed.   I provided 45 minutes of non-face-to-face time during this encounter.   [name], LCSW     THERAPIST PROGRESS NOTE            Session Time: Thursday 01/29/2020 10:10 AM  - 10:55 AM   Participation Level: Active  Behavioral Response: CasualAlert/less depressed   Type of Therapy: Individual Therapy  Treatment Goals addressed: Patient states wanting to stop being so depressed all the time, wanting to work on her attitude/ Alleviate symptoms of depression,resume normal interest in activities and socialization  Interventions: CBT and Supportive  Summary: Susan Ball is a 50 y.o. female who p is referred for services by [name]. She just completed IOP on March 27, 2019. She has long standing history of bipolar disorder and borderline personality disorder. She has had 3 psychiatric hospitalizations. The last was in 2010 at [facility] due to suicide attempt. She participated in outpatient therapy intermittently at [facility] for 3 years. She last was seen there 6 months ago.  Current symptoms include depressed mood, crying spells, mood swings, aggression, anger, excessive worry, sleep difficulty, poor concentration, and memory difficulty.  Patient was seen via virtual visit about 3 weeks ago.  She reports increased stable mood since last session.  She reports she did not contact Dr. Lolly Mustache regarding the medication as she used helpful coping strategies to manage stress.  She says her current level of lithium is working and  reports medication compliance.  She states medication is very helpful and not being able to think more clearly.  She reports decreased impulsivity, decreased outbursts, and improved interaction with family.  She reports experiencing fleeting suicidal ideations a few days ago but using DBT skills discussed in previous sessions to manage.  She also reports increased use of her spirituality after meeting with her pastor a few weeks ago.  Patient reports developing coping statements based on her spirituality.    Suicidal/Homicidal: Patient denies current suicidal ideations.  She agrees to call 911 or have someone take her to the ED should symptoms worsen.   Therapist Response:  reviewed symptoms, praised and reinforced patient's use of helpful coping strategies, discussed effects, reviewed treatment plan, obtained patient's permission to initial plan as this was a virtual visit, discussed next steps for treatment and developed step down plan for termination to include 3-4 more sessions focusing on relapse prevention strategies, assisted patient identify ways to maintain consistency regarding efforts including monitoring her moods/behaviors with the use of previously mailed handouts   Plan: Return again in 2  weeks.  Diagnosis: Axis I: Bipolar Disorder    Axis II: Borderline Personality Dis.    [name], LCSW 01/29/2020

## 2020-02-02 DIAGNOSIS — K5900 Constipation, unspecified: Secondary | ICD-10-CM | POA: Diagnosis not present

## 2020-02-11 ENCOUNTER — Ambulatory Visit (INDEPENDENT_AMBULATORY_CARE_PROVIDER_SITE_OTHER): Payer: Medicare Other | Admitting: Psychiatry

## 2020-02-11 ENCOUNTER — Other Ambulatory Visit: Payer: Self-pay

## 2020-02-11 DIAGNOSIS — F319 Bipolar disorder, unspecified: Secondary | ICD-10-CM

## 2020-02-11 NOTE — Progress Notes (Signed)
Virtual Visit via Video Note  I connected with Susan Ball on 02/11/20 at 11:25 AM EST  by a video enabled telemedicine application and verified that I am speaking with the correct person using two identifiers.  Location: Patient: Home Provider: [facility] [place] offic e    I discussed the limitations of evaluation and management by telemedicine and the availability of in person appointments. The patient expressed understanding and agreed to proceed.    I provided 35  minutes of non-face-to-face time during this encounter.   [name], LCSW    THERAPIST PROGRESS NOTE            Session Time: Wednesday 02/11/2019 11:25 AM - 12:00 PM   Participation Level: Active  Behavioral Response: CasualAlert/less depressed   Type of Therapy: Individual Therapy  Treatment Goals addressed: Patient states wanting to stop being so depressed all the time, wanting to work on her attitude/ Alleviate symptoms of depression,resume normal interest in activities and socialization  Interventions: CBT and Supportive  Summary: Susan Ball is a 49 y.o. female who p is referred for services by [name]. She just completed IOP on March 27, 2019. She has long standing history of bipolar disorder and borderline personality disorder. She has had 3 psychiatric hospitalizations. The last was in 2010 at [facility] due to suicide attempt. She participated in outpatient therapy intermittently at [facility] for 3 years. She last was seen there 6 months ago.  Current symptoms include depressed mood, crying spells, mood swings, aggression, anger, excessive worry, sleep difficulty, poor concentration, and memory difficulty.  Patient was seen via virtual visit about 2 weeks ago.  She reports multiple stressors and increased sadness since last session.  Stressors include issues between her son and his father, car repair issues, and husband quitting his job.  She expresses sadness about the death of her  52 year old patient last week.  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  Patient reports she has been using her spirituality and support from her family to cope.  She is pleased she has not had any suicidal ideations since last session.    Suicidal/Homicidal: NO.   Therapist Response:  reviewed symptoms, praised and reinforced patient's use of helpful coping strategies, discussed stressors, facilitated expression of thoughts and feelings, validated feelings, facilitated patient processing grief and loss issues regarding the death of her patient, normalized feelings, encouraged patient to continue using her support system    Plan: Return again in 2  weeks.  Diagnosis: Axis I: Bipolar Disorder    Axis II: Borderline Personality Dis.    [name], LCSW 02/11/2020

## 2020-02-25 ENCOUNTER — Telehealth (HOSPITAL_COMMUNITY): Payer: Self-pay | Admitting: Psychiatry

## 2020-02-25 ENCOUNTER — Other Ambulatory Visit: Payer: Self-pay

## 2020-02-25 ENCOUNTER — Telehealth (INDEPENDENT_AMBULATORY_CARE_PROVIDER_SITE_OTHER): Payer: Medicare Other | Admitting: Psychiatry

## 2020-02-25 ENCOUNTER — Encounter (HOSPITAL_COMMUNITY): Payer: Self-pay | Admitting: Psychiatry

## 2020-02-25 VITALS — Wt 254.0 lb

## 2020-02-25 DIAGNOSIS — F319 Bipolar disorder, unspecified: Secondary | ICD-10-CM

## 2020-02-25 DIAGNOSIS — F6089 Other specific personality disorders: Secondary | ICD-10-CM | POA: Diagnosis not present

## 2020-02-25 DIAGNOSIS — F419 Anxiety disorder, unspecified: Secondary | ICD-10-CM | POA: Diagnosis not present

## 2020-02-25 MED ORDER — CLONIDINE HCL 0.1 MG PO TABS: 0.1000 mg | ORAL_TABLET | Freq: Every day | ORAL | 0 refills | Status: DC

## 2020-02-25 MED ORDER — REXULTI 0.5 MG PO TABS: 0.5000 mg | ORAL_TABLET | Freq: Every day | ORAL | 0 refills | Status: DC

## 2020-02-25 MED ORDER — FLUOXETINE HCL 40 MG PO CAPS: 40.0000 mg | ORAL_CAPSULE | Freq: Every day | ORAL | 0 refills | Status: DC

## 2020-02-25 MED ORDER — LITHIUM CARBONATE ER 450 MG PO TBCR: EXTENDED_RELEASE_TABLET | ORAL | 0 refills | Status: DC

## 2020-02-25 MED ORDER — FLUOXETINE HCL 20 MG PO CAPS: 20.0000 mg | ORAL_CAPSULE | Freq: Every day | ORAL | 0 refills | Status: DC
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
## 2020-02-25 NOTE — Telephone Encounter (Signed)
D:  Dr. Lolly Mustache referred pt to MH-IOP.  A:  Placed call to re-orient pt.  Encouraged pt to verify her benefits.  Pt is requesting to start on 03-01-20 instead of tomorrow.  Pt will start 03-01-20 @ 9 a.m.  Inform Dr. Lolly Mustache.  R: Patient receptive.

## 2020-02-26 ENCOUNTER — Telehealth (HOSPITAL_COMMUNITY): Payer: Self-pay | Admitting: *Deleted

## 2020-02-26 NOTE — Telephone Encounter (Signed)
VM left for pt regarding available samples of Rexulti 0.5mg .

## 2020-02-27 ENCOUNTER — Other Ambulatory Visit (HOSPITAL_COMMUNITY): Payer: Self-pay | Admitting: Psychiatry

## 2020-02-27 DIAGNOSIS — F319 Bipolar disorder, unspecified: Secondary | ICD-10-CM

## 2020-03-01 ENCOUNTER — Encounter (HOSPITAL_COMMUNITY): Payer: Self-pay | Admitting: Psychiatry

## 2020-03-01 ENCOUNTER — Other Ambulatory Visit: Payer: Self-pay

## 2020-03-01 ENCOUNTER — Other Ambulatory Visit (HOSPITAL_COMMUNITY): Payer: Medicare Other | Attending: Psychiatry | Admitting: Family

## 2020-03-01 ENCOUNTER — Ambulatory Visit (HOSPITAL_COMMUNITY): Payer: Medicare Other | Admitting: Psychiatry

## 2020-03-01 DIAGNOSIS — R45851 Suicidal ideations: Secondary | ICD-10-CM | POA: Diagnosis not present

## 2020-03-01 DIAGNOSIS — Z818 Family history of other mental and behavioral disorders: Secondary | ICD-10-CM | POA: Insufficient documentation

## 2020-03-01 DIAGNOSIS — F411 Generalized anxiety disorder: Secondary | ICD-10-CM | POA: Insufficient documentation

## 2020-03-01 DIAGNOSIS — Z733 Stress, not elsewhere classified: Secondary | ICD-10-CM | POA: Insufficient documentation

## 2020-03-01 DIAGNOSIS — Z79899 Other long term (current) drug therapy: Secondary | ICD-10-CM | POA: Insufficient documentation

## 2020-03-01 DIAGNOSIS — Z566 Other physical and mental strain related to work: Secondary | ICD-10-CM | POA: Diagnosis not present

## 2020-03-01 DIAGNOSIS — F3289 Other specified depressive episodes: Secondary | ICD-10-CM | POA: Insufficient documentation

## 2020-03-01 DIAGNOSIS — Z638 Other specified problems related to primary support group: Secondary | ICD-10-CM | POA: Insufficient documentation

## 2020-03-01 DIAGNOSIS — F4321 Adjustment disorder with depressed mood: Secondary | ICD-10-CM | POA: Insufficient documentation

## 2020-03-01 DIAGNOSIS — F319 Bipolar disorder, unspecified: Secondary | ICD-10-CM

## 2020-03-01 DIAGNOSIS — F32A Depression, unspecified: Secondary | ICD-10-CM | POA: Diagnosis present

## 2020-03-01 NOTE — Progress Notes (Unsigned)
Virtual Visit via Video Note  I connected with [name] on 03/01/20 at  9:00 AM EST by a video enabled telemedicine application and verified that I am speaking with the correct person using two identifiers.  At orientation to the IOP program, Case Manager discussed the limitations of evaluation and management by telemedicine and the availability of in person appointments. The patient expressed understanding and agreed to proceed with virtual visits throughout the duration of the program.   Location:  Patient: Patient Home Provider: Home Office   History of Present Illness: Bipolar 1 DO  Observations/Objective: Check In: Case Manager checked in with all participants to review discharge dates, insurance authorizations, work-related documents and needs from the treatment team. Client stated needs and engaged in discussion. Case Manager welcomed new Client to the group, as group members introduced selves and started the joining process.    Initial Therapeutic Activity: Counselor facilitated a group processing with group members to assess mood and current functioning. Counselor prompted group members to share about new life stressors, utilization of coping skills, and progress in treatment. Today is the Client's first session of IOP, sharing need and history of treatment, about support group and goals for treatment. Client shared about recent loss of a friend to sudden death, as well as loss of job, causing grief issues and depression. Client presents with severe depression and moderate anxiety. Client denied any current SI/HI/psychosis.  Second Therapeutic Activity: Counselor shared the [employer] with group members, prompting them to identify the life domains they balance well and where they need more intentional growth. Client stated that she would like to add more time in nature, people watching,  and exercising. Counselor challenged group members to choose one or two of the suggested  strategies to try and apply over the coming weeks. Additionally, Counselor provided group of 2 lists of 100 coping skills for depression and anxiety to pull from to beef up coping skills catalog to access in preventing and in times of distress.  Check Out: Counselor prompted group members to identify one self-care practice or productivity activity they would like to engage in today. Client plans to take a zumba class and contact her son, who is recently estranged. Client endorsed safety plan to be followed to prevent safety issues.  Assessment and Plan: Clinician recommends that Client remain in IOP treatment to better manage mental health symptoms, stabilization and to address treatment plan goals. Clinician recommends adherence to crisis/safety plan, taking medications as prescribed, and following up with medical professionals if any issues arise.   Follow Up Instructions: Clinician will send Webex link for next session. The Client was advised to call back or seek an in-person evaluation if the symptoms worsen or if the condition fails to improve as anticipated.     I provided 180 minutes of non-face-to-face time during this encounter.     Hilbert Odor, LCSW
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
## 2020-03-02 ENCOUNTER — Other Ambulatory Visit: Payer: Self-pay

## 2020-03-02 ENCOUNTER — Other Ambulatory Visit (HOSPITAL_COMMUNITY): Payer: Medicare Other | Admitting: Family

## 2020-03-02 DIAGNOSIS — F3289 Other specified depressive episodes: Secondary | ICD-10-CM | POA: Diagnosis not present

## 2020-03-02 DIAGNOSIS — F331 Major depressive disorder, recurrent, moderate: Secondary | ICD-10-CM

## 2020-03-02 DIAGNOSIS — F319 Bipolar disorder, unspecified: Secondary | ICD-10-CM
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
## 2020-03-03 ENCOUNTER — Other Ambulatory Visit: Payer: Self-pay

## 2020-03-03 ENCOUNTER — Encounter (HOSPITAL_COMMUNITY): Payer: Self-pay | Admitting: Family

## 2020-03-03 ENCOUNTER — Encounter (HOSPITAL_COMMUNITY): Payer: Self-pay

## 2020-03-03 ENCOUNTER — Other Ambulatory Visit (HOSPITAL_COMMUNITY): Payer: Medicare Other | Admitting: Psychiatry

## 2020-03-03 DIAGNOSIS — F319 Bipolar disorder, unspecified: Secondary | ICD-10-CM

## 2020-03-03 DIAGNOSIS — F3289 Other specified depressive episodes: Secondary | ICD-10-CM | POA: Diagnosis not present

## 2020-03-03 NOTE — Progress Notes (Signed)
Virtual Visit via Video Note  I connected with Susan Ball on 03/03/20 at  9:00 AM EST by a video enabled telemedicine application and verified that I am speaking with the correct person using two identifiers.  At orientation to the IOP program, Case Manager discussed the limitations of evaluation and management by telemedicine and the availability of in person appointments. The patient expressed understanding and agreed to proceed with virtual visits throughout the duration of the program.   Location:  Patient: Patient Home Provider: Home Office   History of Present Illness: Bipolar 1 DO  Observations/Objective: Check In: Case Manager checked in with all participants to review discharge dates, insurance authorizations, work-related documents and needs from the treatment team. Client stated needs and engaged in discussion. Counselor facilitated a group processing with group members to assess mood and current functioning. Counselor prompted group members to share about new life stressors, utilization of coping skills, and progress in treatment.Client presents with severe depression and severe anxiety. Client denied any current SI/HI/psychosis.  Initial Therapeutic Activity: Counselor introduced our guest speaker, Peggye Fothergill, [facility] Pharmacist, who shared about psychiatric medications, side effects, treatment considerations and how to communicate with medical professionals. Group Members asked questions and shared medication concerns.    Second Therapeutic Activity: Counselor prompted group members to reference a worksheet called, "Body Scan" to jot down questions and concerns about their physical health in preparation for their upcoming appointments with medical professionals. Client deals with dry mouth due to medications and challenges with dental care, making attempts to be preventive from worse damage. Counselor encouraged routine medical check-ups, preparing for appointments, following  up with recommendations and seeking specialist if needed.  Check Out: Counselor prompted group members to identify one self-care practice or productivity activity they would like to engage in today. Client plans to work out at [employer]. Client endorsed safety plan to be followed to prevent safety issues.  Assessment and Plan: Clinician recommends that Client remain in IOP treatment to better manage mental health symptoms, stabilization and to address treatment plan goals. Clinician recommends adherence to crisis/safety plan, taking medications as prescribed, and following up with medical professionals if any issues arise.   Follow Up Instructions: Clinician will send Webex link for next session. The Client was advised to call back or seek an in-person evaluation if the symptoms worsen or if the condition fails to improve as anticipated.     I provided 180 minutes of non-face-to-face time during this encounter.     Hilbert Odor, LCSW

## 2020-03-03 NOTE — Progress Notes (Signed)
Virtual Visit via Video Note  I connected with [name] on 03/03/20 at  9:00 AM EST by a video enabled telemedicine application and verified that I am speaking with the correct person using two identifiers.  At orientation to the IOP program, Case Manager discussed the limitations of evaluation and management by telemedicine and the availability of in person appointments. The patient expressed understanding and agreed to proceed with virtual visits throughout the duration of the program.   Location:  Patient: Patient Home Provider: Home Office   History of Present Illness:   Observations/Objective: Check In: Case Manager checked in with all participants to review discharge dates, insurance authorizations, work-related documents and needs from the treatment team. Client stated needs and engaged in discussion.   Initial Therapeutic Activity: Counselor facilitated a group processing with group members to assess mood and current functioning. Counselor prompted group members to share about new life stressors, utilization of coping skills, and progress in treatment. Client reports _. Client presents with _ depression and _ anxiety. Client denied any current SI/HI/psychosis.   Second Therapeutic Activity: Counselor shared a video by Rob Hickman, from Therapy In a Nutshell, on how to rethink how we use coping skills, mainly focusing on the use to calm, relax or regulate self in order to reengage by taking action to combat life stressors. Often time coping skills are used to distract, which easily turns into avoidance. Counselor and group members identified takeaways from the video, with many highlighting that they have a better understanding of how to use coping skills identified in yesterday's session. Counselor additionally covered grounding and relaxation skills Client's can start practicing when distressed.  Check Out: Counselor prompted group members to identify one self-care practice or  productivity activity they would like to engage in today. Client plans to _. Client endorsed safety plan to be followed to prevent safety issues.  Assessment and Plan: Clinician recommends that Client remain in IOP treatment to better manage mental health symptoms, stabilization and to address treatment plan goals. Clinician recommends adherence to crisis/safety plan, taking medications as prescribed, and following up with medical professionals if any issues arise.   Follow Up Instructions: Clinician will send Webex link for next session. The Client was advised to call back or seek an in-person evaluation if the symptoms worsen or if the condition fails to improve as anticipated.     I provided 180 minutes of non-face-to-face time during this encounter.     Hilbert Odor, LCSW

## 2020-03-04 ENCOUNTER — Other Ambulatory Visit (HOSPITAL_COMMUNITY): Payer: Medicare Other | Admitting: Psychiatry

## 2020-03-04 ENCOUNTER — Other Ambulatory Visit: Payer: Self-pay

## 2020-03-04 DIAGNOSIS — F319 Bipolar disorder, unspecified: Secondary | ICD-10-CM

## 2020-03-04 DIAGNOSIS — F3289 Other specified depressive episodes: Secondary | ICD-10-CM | POA: Diagnosis not present

## 2020-03-05 ENCOUNTER — Other Ambulatory Visit (HOSPITAL_COMMUNITY): Payer: Medicare Other | Admitting: Psychiatry

## 2020-03-05 ENCOUNTER — Other Ambulatory Visit: Payer: Self-pay

## 2020-03-05 DIAGNOSIS — F3289 Other specified depressive episodes: Secondary | ICD-10-CM | POA: Diagnosis not present

## 2020-03-05 DIAGNOSIS — F319 Bipolar disorder, unspecified: Secondary | ICD-10-CM

## 2020-03-07 ENCOUNTER — Encounter (HOSPITAL_COMMUNITY): Payer: Self-pay

## 2020-03-07 NOTE — Progress Notes (Signed)
Virtual Visit via Video Note  I connected with Susan Ball on 03/07/20 at  9:00 AM EST by a video enabled telemedicine application and verified that I am speaking with the correct person using two identifiers.  At orientation to the IOP program, Case Manager discussed the limitations of evaluation and management by telemedicine and the availability of in person appointments. The patient expressed understanding and agreed to proceed with virtual visits throughout the duration of the program.   Location:  Patient: Patient Home Provider: Home Office   History of Present Illness: Bipolar Disorder  Observations/Objective: Check In: Case Manager checked in with all participants to review discharge dates, insurance authorizations, work-related documents and needs from the treatment team. Client stated needs and engaged in discussion.   Initial Therapeutic Activity: Case Manager engaged group in an ice breaker/get to know you game that was [employer] themed. Group members shared their responses and engaged in activity.  Second Therapeutic Activity: Counselor facilitated therapeutic processing with group members to assess mood and current functioning, prompting group members to share about application of skills, progress and challenges in treatment/personal lives. Client reports that she is committed to being more vulnerable and open in the therapeutic process. Client discussed boundaries set on her emotions. Client considering returning to work to work through grief and loss through distraction and reconnecting with Clients. Client presents with moderate depression and moderate anxiety. Client denied any current SI/HI/psychosis.  Check Out: Counselor prompted group members to identify one self-care practice or productivity activity they would like to engage in today. Client plans to spend time with her husband and faith community. Client endorsed safety plan to be followed to prevent safety  issues.  Assessment and Plan: Clinician recommends that Client remain in IOP treatment to better manage mental health symptoms, stabilization and to address treatment plan goals. Clinician recommends adherence to crisis/safety plan, taking medications as prescribed, and following up with medical professionals if any issues arise.   Follow Up Instructions: Clinician will send Webex link for next session. The Client was advised to call back or seek an in-person evaluation if the symptoms worsen or if the condition fails to improve as anticipated.     I provided 180 minutes of non-face-to-face time during this encounter.     Hilbert Odor, LCSW

## 2020-03-07 NOTE — Progress Notes (Signed)
Virtual Visit via Video Note  I connected with [name] on 03/07/20 at  9:00 AM EST by a video enabled telemedicine application and verified that I am speaking with the correct person using two identifiers.  At orientation to the IOP program, Case Manager discussed the limitations of evaluation and management by telemedicine and the availability of in person appointments. The patient expressed understanding and agreed to proceed with virtual visits throughout the duration of the program.   Location:  Patient: Patient Home Provider: Home Office   History of Present Illness: Bipolar 1 DO  Observations/Objective: Check In: Case Manager checked in with all participants to review discharge dates, insurance authorizations, work-related documents and needs from the treatment team. Client stated needs and engaged in discussion. Case Manager introduced new group member, with group members welcoming and starting the joining process. Client presents with severe depression and moderate anxiety. Client denied any current SI/HI/psychosis.  Initial Therapeutic Activity: Counselor introduced Sheppard Coil, [place] Chaplain to present information and discussion on Grief and Loss. Group members engaged in discussion, sharing how grief impacts them, what comforts them, what emotions are felt, labeling losses, etc. After guest speaker logged off, Counselor prompted group to spend 10-15 minutes journaling to process personal grief and loss situations. Counselor processed entries with group and client's identified areas for additional processing in individual therapy. 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  Second Therapeutic Activity: Counselor introduced [employer], representative with [facility] [place] to share about programming. Group Members asked questions and engaged in discussion, as Trinna Post shared about Peer Support, Support  Groups and the [employer]. Client stated that they are interested in connecting with the Grief and Loss Course and Her Black Women's Support Group.  Check Out: Counselor prompted group members to identify one self-care practice or productivity activity they would like to engage in today. Client plans to take a zumba class. Client endorsed safety plan to be followed to prevent safety issues.  Assessment and Plan: Clinician recommends that Client remain in IOP treatment to better manage mental health symptoms, stabilization and to address treatment plan goals. Clinician recommends adherence to crisis/safety plan, taking medications as prescribed, and following up with medical professionals if any issues arise.   Follow Up Instructions: Clinician will send Webex link for next session. The Client was advised to call back or seek an in-person evaluation if the symptoms worsen or if the condition fails to improve as anticipated.     I provided 180 minutes of non-face-to-face time during this encounter.     Hilbert Odor, LCSW

## 2020-03-08 ENCOUNTER — Other Ambulatory Visit (HOSPITAL_COMMUNITY): Payer: Medicare Other | Admitting: Psychiatry

## 2020-03-08 ENCOUNTER — Other Ambulatory Visit: Payer: Self-pay

## 2020-03-08 ENCOUNTER — Encounter (HOSPITAL_COMMUNITY): Payer: Self-pay

## 2020-03-08 DIAGNOSIS — F3289 Other specified depressive episodes: Secondary | ICD-10-CM | POA: Diagnosis not present

## 2020-03-08 DIAGNOSIS — F319 Bipolar disorder, unspecified: Secondary | ICD-10-CM

## 2020-03-08 NOTE — Progress Notes (Unsigned)
Virtual Visit via Video Note  I connected with Susan Ball on 03/08/20 at  9:00 AM EST by a video enabled telemedicine application and verified that I am speaking with the correct person using two identifiers.  At orientation to the IOP program, Case Manager discussed the limitations of evaluation and management by telemedicine and the availability of in person appointments. The patient expressed understanding and agreed to proceed with virtual visits throughout the duration of the program.   Location:  Patient: Patient Home Provider: Home Office   History of Present Illness: Bipolar 1 DO  Observations/Objective: Check In: Case Manager checked in with all participants to review discharge dates, insurance authorizations, work-related documents and needs from the treatment team. Client stated needs and engaged in discussion.   Initial Therapeutic Activity: Counselor facilitated therapeutic processing with group members to assess mood and current functioning, prompting group members to share about application of skills, progress and challenges in treatment/personal lives. Client reports that she was active other the weekend, assisting family in cleaning home. Client notes that it felt good to be productive and out of her home. Client engaged in faith services and reports overall mood improving. Client presents with moderate depression and mild anxiety. Client denied any current SI/HI/psychosis.  Second Therapeutic Activity: Counselor introduced the group to guided imagery principles. Counselor engaged group in practice called, Relaxation for Self-Esteem, which included deep breathing, positive affirmations, imagination engagement and grounding techniques. Counselor facilitated the script then checked in with group members on their experience and takeaways. Client stated that she enjoyed creating and experiencing her happy place, found affirmations challenging to believe.  Check Out: Counselor  prompted group members to identify one self-care practice or productivity activity they would like to engage in today. Client plans to get her hair done and go to [facility]. Client endorsed safety plan to be followed to prevent safety issues.  Assessment and Plan: Clinician recommends that Client remain in IOP treatment to better manage mental health symptoms, stabilization and to address treatment plan goals. Clinician recommends adherence to crisis/safety plan, taking medications as prescribed, and following up with medical professionals if any issues arise.   Follow Up Instructions: Clinician will send Webex link for next session. The Client was advised to call back or seek an in-person evaluation if the symptoms worsen or if the condition fails to improve as anticipated.     I provided 180 minutes of non-face-to-face time during this encounter.     Hilbert Odor, LCSW

## 2020-03-09 ENCOUNTER — Encounter (HOSPITAL_COMMUNITY): Payer: Self-pay

## 2020-03-09 ENCOUNTER — Other Ambulatory Visit: Payer: Self-pay

## 2020-03-09 ENCOUNTER — Other Ambulatory Visit (HOSPITAL_COMMUNITY): Payer: Medicare Other | Admitting: Psychiatry

## 2020-03-09 DIAGNOSIS — F3289 Other specified depressive episodes: Secondary | ICD-10-CM | POA: Diagnosis not present

## 2020-03-09 DIAGNOSIS — F319 Bipolar disorder, unspecified: Secondary | ICD-10-CM

## 2020-03-09 NOTE — Progress Notes (Unsigned)
Virtual Visit via Video Note  I connected with Susan Ball on 03/09/20 at  9:00 AM EST by a video enabled telemedicine application and verified that I am speaking with the correct person using two identifiers.  At orientation to the IOP program, Case Manager discussed the limitations of evaluation and management by telemedicine and the availability of in person appointments. The patient expressed understanding and agreed to proceed with virtual visits throughout the duration of the program.   Location:  Patient: Patient Home Provider: Home Office   History of Present Illness: Bipolar 1 DO  Observations/Objective: Check In: Case Manager checked in with all participants to review discharge dates, insurance authorizations, work-related documents and needs from the treatment team. Client stated needs and engaged in discussion. Case Manager welcomed 2 new group members, with group introducing self and starting the joining process.   Initial Therapeutic Activity: Counselor facilitated therapeutic processing with group members to assess mood and current functioning, prompting group members to share about application of skills, progress and challenges in treatment/personal lives. Client reports that she was proud of how she reacted to a crisis situation. Client disclosed about her anger, rage, and reactivity in the past. Client states that new medication is helping her to cope better and slow reaction rate. Client presents with mild depression and mild anxiety. Client denied any current SI/HI/psychosis.  Second Therapeutic Activity: Counselor closed program by allowing time to celebrate a graduating group member. Counselor shared reflections on progress and allow space for group members to share well wishes and appreciates to the graduating client. Counselor prompted graduating client to share takeaways, reflect on progress and final thoughts for the group.  Check Out: Counselor prompted group  members to identify one self-care practice or productivity activity they would like to engage in today. Client plans to exercise. Client endorsed safety plan to be followed to prevent safety issues.  Assessment and Plan: Clinician recommends that Client remain in IOP treatment to better manage mental health symptoms, stabilization and to address treatment plan goals. Clinician recommends adherence to crisis/safety plan, taking medications as prescribed, and following up with medical professionals if any issues arise.   Follow Up Instructions: Clinician will send Webex link for next session. The Client was advised to call back or seek an in-person evaluation if the symptoms worsen or if the condition fails to improve as anticipated.     I provided 180 minutes of non-face-to-face time during this encounter.     Hilbert Odor, LCSW

## 2020-03-10 ENCOUNTER — Encounter (HOSPITAL_COMMUNITY): Payer: Self-pay

## 2020-03-10 ENCOUNTER — Other Ambulatory Visit: Payer: Self-pay

## 2020-03-10 ENCOUNTER — Other Ambulatory Visit (HOSPITAL_COMMUNITY): Payer: Medicare Other | Admitting: Psychiatry

## 2020-03-10 DIAGNOSIS — F3289 Other specified depressive episodes: Secondary | ICD-10-CM | POA: Diagnosis not present

## 2020-03-10 DIAGNOSIS — F319 Bipolar disorder, unspecified: Secondary | ICD-10-CM

## 2020-03-10 NOTE — Progress Notes (Unsigned)
Virtual Visit via Video Note  I connected with [name] on 03/10/20 at  9:00 AM EST by a video enabled telemedicine application and verified that I am speaking with the correct person using two identifiers.  At orientation to the IOP program, Case Manager discussed the limitations of evaluation and management by telemedicine and the availability of in person appointments. The patient expressed understanding and agreed to proceed with virtual visits throughout the duration of the program.   Location:  Patient: Patient Home Provider: Home Office   History of Present Illness: MDD  Observations/Objective: Check In: Case Manager checked in with all participants to review discharge dates, insurance authorizations, work-related documents and needs from the treatment team. Client stated needs and engaged in discussion. Case Manager welcomed 2 new group members, with group introducing self and starting the joining process.   Initial Therapeutic Activity: Counselor facilitated therapeutic processing with group members to assess mood and current functioning, prompting group members to share about application of skills, progress and challenges in treatment/personal lives. Client reports positive interactions with her son who has been estranged. Client presents with mild depression and mild anxiety. Client denied any current SI/HI/psychosis.  Second Therapeutic Activity: Counselor introduced Chief Operating Officer, Clyda Greener, Director of Wellness to present information on [employer] and Benefits. Clients engaged in activities and discussion, personalizing the information to their individual circumstances. Client motivated to make small changes in wellness practices to improve overall mental health.  Check Out: Counselor closed program by allowing time to celebrate a graduating group member. Counselor shared reflections on progress and allow space for group members to share well wishes and  appreciates to the graduating client. Counselor prompted graduating client to share takeaways, reflect on progress and final thoughts for the group. Client endorsed safety plan to be followed to prevent safety issues.  Assessment and Plan: Clinician recommends that Client remain in IOP treatment to better manage mental health symptoms, stabilization and to address treatment plan goals. Clinician recommends adherence to crisis/safety plan, taking medications as prescribed, and following up with medical professionals if any issues arise.   Follow Up Instructions: Clinician will send Webex link for next session. The Client was advised to call back or seek an in-person evaluation if the symptoms worsen or if the condition fails to improve as anticipated.     I provided 180 minutes of non-face-to-face time during this encounter.

## 2020-03-11 ENCOUNTER — Other Ambulatory Visit (HOSPITAL_COMMUNITY): Payer: Medicare Other | Admitting: Psychiatry

## 2020-03-11 ENCOUNTER — Other Ambulatory Visit: Payer: Self-pay

## 2020-03-11 ENCOUNTER — Encounter (HOSPITAL_COMMUNITY): Payer: Self-pay

## 2020-03-11 ENCOUNTER — Ambulatory Visit (HOSPITAL_COMMUNITY): Payer: Medicare Other

## 2020-03-11 DIAGNOSIS — F3289 Other specified depressive episodes: Secondary | ICD-10-CM | POA: Diagnosis not present

## 2020-03-11 DIAGNOSIS — F319 Bipolar disorder, unspecified: Secondary | ICD-10-CM

## 2020-03-11 NOTE — Progress Notes (Signed)
Virtual Visit via Video Note  I connected with Susan Ball on 03/11/20 at  9:00 AM EST by a video enabled telemedicine application and verified that I am speaking with the correct person using two identifiers.  At orientation to the IOP program, Case Manager discussed the limitations of evaluation and management by telemedicine and the availability of in person appointments. The patient expressed understanding and agreed to proceed with virtual visits throughout the duration of the program.   Location:  Patient: Patient Home Provider: Home Office   History of Present Illness: Bipolar DO  Observations/Objective: Check In: Case Manager checked in with all participants to review discharge dates, insurance authorizations, work-related documents and needs from the treatment team. Client stated needs and engaged in discussion. Case Manager welcomed a new group member, with group introducing self and starting the joining process.   Initial Therapeutic Activity: Counselor facilitated therapeutic processing with group members to assess mood and current functioning, prompting group members to share about application of skills, progress and challenges in treatment/personal lives. Client reports that she is doing ok today. During session Client was triggered, having to dismiss self, to prevent onset of panic attack. Case Manager contacted to ensure safety and to deescalate. Client presents with moderate depression and severe anxiety. Client denied any current SI/HI/psychosis.  Second Therapeutic Activity: Counselor introduced [place], [place] Chaplain to present information and discussion on Grief and Loss. Group members engaged in discussion, sharing how grief impacts them, what comforts them, what emotions are felt, labeling losses, etc. After guest speaker logged off, Counselor prompted group to spend 10-15 minutes journaling to process personal grief and loss situations. Counselor processed  entries with group and client's identified areas for additional processing in individual therapy. Client noted loss of patient and impact of MH diagnosis on family relationships.    Assessment and Plan: Clinician recommends that Client remain in IOP treatment to better manage mental health symptoms, stabilization and to address treatment plan goals. Clinician recommends adherence to crisis/safety plan, taking medications as prescribed, and following up with medical professionals if any issues arise.   Follow Up Instructions: Clinician will send Webex link for next session. The Client was advised to call back or seek an in-person evaluation if the symptoms worsen or if the condition fails to improve as anticipated.     I provided 180 minutes of non-face-to-face time during this encounter.     Hilbert Odor, LCSW

## 2020-03-12 ENCOUNTER — Encounter (HOSPITAL_COMMUNITY): Payer: Self-pay

## 2020-03-12 ENCOUNTER — Other Ambulatory Visit: Payer: Self-pay

## 2020-03-12 ENCOUNTER — Other Ambulatory Visit (HOSPITAL_COMMUNITY): Payer: Medicare Other | Admitting: Psychiatry

## 2020-03-12 DIAGNOSIS — F319 Bipolar disorder, unspecified: Secondary | ICD-10-CM

## 2020-03-12 DIAGNOSIS — F331 Major depressive disorder, recurrent, moderate: Secondary | ICD-10-CM

## 2020-03-12 DIAGNOSIS — F3289 Other specified depressive episodes: Secondary | ICD-10-CM | POA: Diagnosis not present

## 2020-03-12 NOTE — Progress Notes (Signed)
Virtual Visit via Video Note  I connected with Susan Ball on 03/12/20 at  9:00 AM EST by a video enabled telemedicine application and verified that I am speaking with the correct person using two identifiers.  At orientation to the IOP program, Case Manager discussed the limitations of evaluation and management by telemedicine and the availability of in person appointments. The patient expressed understanding and agreed to proceed with virtual visits throughout the duration of the program.   Location:  Patient: Patient Home Provider: Home Office   History of Present Illness: Bipolar DO  Observations/Objective: Check In: Case Manager checked in with all participants to review discharge dates, insurance authorizations, work-related documents and needs from the treatment team. Client stated needs and engaged in discussion.   Initial Therapeutic Activity: Counselor facilitated therapeutic processing with group members to assess mood and current functioning, prompting group members to share about application of skills, progress and challenges in treatment/personal lives. Client shared about onset and trigger in panic attack during session. Client disclosed more information about her grief and loss experience with passing of patient. Client shred that she is making plans to reconnect with the family. Client presents with modrate depression and moderate anxiety. Client denied any current SI/HI/psychosis.  Second Therapeutic Activity: Counselor facilitated the creation of a Mental Health Crisis and Safety Plan with group members. Counselor prompted group members to identify warning signs, coping skills, distractions, support people, providers and crisis numbers and safety considerations. Group members completed form and shared their responses.   Check Out: Client prompted group members to share one or more motivators for living. Client named that her husband as their primary reason for living and  staying safe. Client endorsed safety plan to be followed to prevent safety issues.  Assessment and Plan: Clinician recommends that Client remain in IOP treatment to better manage mental health symptoms, stabilization and to address treatment plan goals. Clinician recommends adherence to crisis/safety plan, taking medications as prescribed, and following up with medical professionals if any issues arise.   Follow Up Instructions: Clinician will send Webex link for next session. The Client was advised to call back or seek an in-person evaluation if the symptoms worsen or if the condition fails to improve as anticipated.     I provided 180 minutes of non-face-to-face time during this encounter.     Hilbert Odor, LCSW

## 2020-03-15 ENCOUNTER — Ambulatory Visit (HOSPITAL_COMMUNITY): Payer: Medicare Other | Admitting: Psychiatry

## 2020-03-15 ENCOUNTER — Other Ambulatory Visit (HOSPITAL_COMMUNITY): Payer: Medicare Other | Admitting: Psychiatry

## 2020-03-15 ENCOUNTER — Other Ambulatory Visit: Payer: Self-pay

## 2020-03-15 DIAGNOSIS — F319 Bipolar disorder, unspecified: Secondary | ICD-10-CM

## 2020-03-15 DIAGNOSIS — F3289 Other specified depressive episodes: Secondary | ICD-10-CM | POA: Diagnosis not present

## 2020-03-16 ENCOUNTER — Other Ambulatory Visit: Payer: Self-pay

## 2020-03-16 ENCOUNTER — Encounter (HOSPITAL_COMMUNITY): Payer: Self-pay | Admitting: Family

## 2020-03-16 ENCOUNTER — Other Ambulatory Visit (HOSPITAL_COMMUNITY): Payer: Medicare Other | Admitting: Family

## 2020-03-16 DIAGNOSIS — F319 Bipolar disorder, unspecified: Secondary | ICD-10-CM

## 2020-03-16 DIAGNOSIS — F331 Major depressive disorder, recurrent, moderate: Secondary | ICD-10-CM

## 2020-03-16 DIAGNOSIS — F3289 Other specified depressive episodes: Secondary | ICD-10-CM | POA: Diagnosis not present

## 2020-03-16 NOTE — Progress Notes (Signed)
Virtual Visit via Video Note  I connected with Susan Ball on 03/16/20 at  9:00 AM EST by a video enabled telemedicine application and verified that I am speaking with the correct person using two identifiers.  At orientation to the IOP program, Case Manager discussed the limitations of evaluation and management by telemedicine and the availability of in person appointments. The patient expressed understanding and agreed to proceed with virtual visits throughout the duration of the program.   Location:  Patient: Patient Home Provider: Home Office   History of Present Illness: Bipolar DO and MDD  Observations/Objective: Check In: Case Manager checked in with all participants to review discharge dates, insurance authorizations, work-related documents and needs from the treatment team. Client stated needs and engaged in discussion.   Initial Therapeutic Activity: Counselor facilitated therapeutic processing with group members to assess mood and current functioning, prompting group members to share about application of skills, progress and challenges in treatment/personal lives. Client reports that she is unsure about completing the program today, having mixed emotions. Counselor processed remaining needs, thoughts and feelings. Client decided she was prepared to finish the program today, leaving in a much better condition than when she arrived. Client noted a reduction in anger outbursts, less body tension, reduced anxiety and ability to manage grief responses in a healthier manner. Client presents with mild depression and mild anxiety. Client denied any current SI/HI/psychosis.  Second Therapeutic Activity: Counselor provided group with Anxiety Igniting Thoughts Assessment, from [employer] [facility], based off the book, the Anxious Brain. Counselor facilitated assessments, with group evaluating Pessimistic, Obsessive, Worrying, Perfectionism, Guilt/Shame, Right Brain Hemisphere Thinking. Group  members shared their scores and reflections on the activity.   Check Out: Client prompted group members to share one productivity activity or one self-care practice they can engage in today. Client plans to attend zumba and weight lift. Client endorsed safety plan to be followed to prevent safety issues.  Assessment and Plan: Clinician recommends that Client step down from IOP treatment to continue management of mental health symptoms, stabilization and to address treatment plan goals. Clinician recommends adherence to crisis/safety plan, taking medications as prescribed, and following up with medical professionals if any issues arise.   Follow Up Instructions: Clinician will send Webex link for next session. The Client was advised to call back or seek an in-person evaluation if the symptoms worsen or if the condition fails to improve as anticipated.     I provided 180 minutes of non-face-to-face time during this encounter.     Hilbert Odor, LCSW

## 2020-03-16 NOTE — Patient Instructions (Signed)
D:  Patient completed MH-IOP today.  A:  Follow up with Dr. Lolly Mustache on 03-26-20 at 10:20 a.m and [facility], on 03-29-20 @ 11 a.m.  Encouraged support groups.  R:  Patient receptive.

## 2020-03-16 NOTE — Progress Notes (Signed)
Virtual Visit via Video Note  I connected with Susan Ball on 03/16/20 at  9:00 AM EST by a video enabled telemedicine application and verified that I am speaking with the correct person using two identifiers.  Location: Patient: Home Provider: Office   I discussed the limitations of evaluation and management by telemedicine and the availability of in person appointments. The patient expressed understanding and agreed to proceed.   I discussed the assessment and treatment plan with the patient. The patient was provided an opportunity to ask questions and all were answered. The patient agreed with the plan and demonstrated an understanding of the instructions.   The patient was advised to call back or seek an in-person evaluation if the symptoms worsen or if the condition fails to improve as anticipated.  I provided 15 minutes of non-face-to-face time during this encounter.   Susan Rack, NP   Susan Ball Discharge Summary  Susan Ball [id number]  Admission date: 03/01/2020 Discharge date: 03/16/2020  Reason for admission: per admission assessment note: Susan Ball 50 year old African-American female presents with worsening depression, anxiety and grief and loss. 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 Stated that she had been caring of this clint over 11 years. " its just sad, when you have help someone for so long, they are like family."  Reported family stressors that " keep getting worse."  Reports suicidal ideations however denies plan or intent. "  I reach my breaking point." states she is currently followed by psychiatry and therapy.  States she is currently prescribed lithium, Prozac and Rexulti.  Chart reviewed multiple failed medication trials.  Susan Ball denied history of substance abuse use. Patient reports a family history with mental illness: Mother: Anxiety depression.  Patient to start  intensive outpatient programming on 03/01/2020.  Progress in Ball Toward Treatment Goals: Ongoing, patient attended and participated with daily group session with active and engaged participation.  Denying suicidal or homicidal ideations.  Denies auditory or visual hallucinations.  Initially patient requested to continue intensive outpatient programming for added daily structure and motivation to seek new employment however was reported patient had a mild setback on yesterday and had declined we will continue to discharge as scheduled.  Patient to keep follow-up appointment with outpatient therapist and psychiatrist.  Progress (rationale): Keep follow-up appointment with Susan Ball psychiatrist and Susan Reasons, Susan Ball  Take all medications as prescribed. Keep all follow-up appointments as scheduled.  Do not consume alcohol or use illegal drugs while on prescription medications. Report any adverse effects from your medications to your primary care provider promptly.  In the event of recurrent symptoms or worsening symptoms, call 911, a crisis hotline, or go to the nearest emergency department for evaluation.    Susan Rack, NP 03/16/2020
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
## 2020-03-17 ENCOUNTER — Other Ambulatory Visit: Payer: Self-pay

## 2020-03-17 ENCOUNTER — Other Ambulatory Visit (HOSPITAL_COMMUNITY): Payer: Medicare Other

## 2020-03-17 ENCOUNTER — Encounter (HOSPITAL_COMMUNITY): Payer: Self-pay
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
## 2020-03-18 ENCOUNTER — Ambulatory Visit (HOSPITAL_COMMUNITY): Payer: Medicare Other

## 2020-03-19 ENCOUNTER — Ambulatory Visit (HOSPITAL_COMMUNITY): Payer: Medicare Other

## 2020-03-22 ENCOUNTER — Other Ambulatory Visit (HOSPITAL_COMMUNITY): Payer: Self-pay | Admitting: Psychiatry

## 2020-03-22 DIAGNOSIS — F319 Bipolar disorder, unspecified: Secondary | ICD-10-CM

## 2020-03-24 ENCOUNTER — Telehealth (HOSPITAL_COMMUNITY): Payer: Self-pay | Admitting: *Deleted

## 2020-03-24 NOTE — Telephone Encounter (Signed)
Pt called requesting refills on both the Rexulti and Lithium. Pt has an appointment with Dr. Lolly Mustache on 04/05/20 however is out of meds currently. Please review. Thanks.

## 2020-03-26 ENCOUNTER — Other Ambulatory Visit (HOSPITAL_COMMUNITY): Payer: Self-pay | Admitting: *Deleted

## 2020-03-26 ENCOUNTER — Telehealth (HOSPITAL_COMMUNITY): Payer: Self-pay | Admitting: *Deleted

## 2020-03-26 ENCOUNTER — Telehealth (HOSPITAL_COMMUNITY): Payer: Medicare Other | Admitting: Psychiatry

## 2020-03-26 DIAGNOSIS — F319 Bipolar disorder, unspecified: Secondary | ICD-10-CM

## 2020-03-26 NOTE — Telephone Encounter (Signed)
I spoke to patient who left a message that she has been experiencing sometimes confusion, increased thirst, hunger, nausea and recent fall.  Her last lithium level was 1.3 which was done in January.  Her current medicine is lithium 450 mg twice a day, Rexulti 0.5 mg along with Prozac and Catapres.  I recommend to decrease the lithium to take 1 a day and get the lithium level and blood sugar.  She may have high blood sugar or high lithium level.  If patient continues to experience symptoms after reducing the lithium to 1 a day and blood sugar is high then she may need further intervention.  We also talked if symptoms continue to get worse then she need to see her PCP or go to urgent care.  She agreed with the plan.

## 2020-03-26 NOTE — Telephone Encounter (Signed)
Pt asking for a refill of the Reulti. Please review.

## 2020-03-29 ENCOUNTER — Other Ambulatory Visit: Payer: Self-pay

## 2020-03-29 ENCOUNTER — Ambulatory Visit (INDEPENDENT_AMBULATORY_CARE_PROVIDER_SITE_OTHER): Payer: Medicare Other | Admitting: Psychiatry

## 2020-03-29 DIAGNOSIS — F319 Bipolar disorder, unspecified: Secondary | ICD-10-CM

## 2020-03-29 MED ORDER — REXULTI 0.5 MG PO TABS: 0.5000 mg | ORAL_TABLET | Freq: Every day | ORAL | 0 refills | Status: DC
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
## 2020-03-29 NOTE — Telephone Encounter (Signed)
I called her Rexulti 0.5 mg at [facility] in [place].  Please call the patient and check how she is doing.  She was complaining of confusion, thirst, nausea and hunger and we have recommended to cut down lithium and do the blood work for lithium level and fasting glucose.

## 2020-03-30 LAB — LITHIUM LEVEL: Lithium Lvl: 0.5 mmol/L (ref 0.5–1.2)

## 2020-03-30 LAB — GLUCOSE, FASTING: Glucose, Plasma: 79 mg/dL (ref 65–99)

## 2020-03-31 ENCOUNTER — Telehealth (HOSPITAL_COMMUNITY): Payer: Self-pay | Admitting: *Deleted

## 2020-03-31 NOTE — Telephone Encounter (Signed)
Writer spoke with pt to f/u on last encounter where pt c/o increased thirst, hunger, nausea and some confusion. Pt states those symptoms are not an issue now but she wants her Lithium increased back up to bid as she is experiencing rapid cycling and is frustrated by it. Please review and advise. Thanks.

## 2020-04-01 ENCOUNTER — Telehealth (HOSPITAL_COMMUNITY): Payer: Self-pay | Admitting: *Deleted

## 2020-04-01 ENCOUNTER — Other Ambulatory Visit (HOSPITAL_COMMUNITY): Payer: Self-pay | Admitting: *Deleted

## 2020-04-01 DIAGNOSIS — F319 Bipolar disorder, unspecified: Secondary | ICD-10-CM

## 2020-04-01 MED ORDER — LITHIUM CARBONATE ER 300 MG PO TBCR: EXTENDED_RELEASE_TABLET | ORAL | 2 refills | Status: DC

## 2020-04-01 NOTE — Telephone Encounter (Signed)
Writer left VM for pt regarding increasing Lithium to 300mg  bid. Pt encouraged to call back with any questions or concerns.

## 2020-04-01 NOTE — Telephone Encounter (Signed)
We reduced her lithium past why her symptoms are improved.  Her last lithium level was 0.5.  Before it was 1.3.  It is a big fluctuation.  Currently she is on lithium 450.  She can try 600 but I will reluctant to go further than 600.  Please call the lithium 300 mg twice a day if she agreed to try 600 mg.

## 2020-04-05 ENCOUNTER — Other Ambulatory Visit: Payer: Self-pay

## 2020-04-05 ENCOUNTER — Telehealth (INDEPENDENT_AMBULATORY_CARE_PROVIDER_SITE_OTHER): Payer: Medicare Other | Admitting: Psychiatry

## 2020-04-05 DIAGNOSIS — F6089 Other specific personality disorders: Secondary | ICD-10-CM | POA: Diagnosis not present

## 2020-04-05 DIAGNOSIS — F419 Anxiety disorder, unspecified: Secondary | ICD-10-CM

## 2020-04-05 DIAGNOSIS — F319 Bipolar disorder, unspecified: Secondary | ICD-10-CM | POA: Diagnosis not present
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
## 2020-04-12 ENCOUNTER — Ambulatory Visit (HOSPITAL_COMMUNITY): Payer: Medicare Other | Admitting: Psychiatry

## 2020-04-12 ENCOUNTER — Other Ambulatory Visit: Payer: Self-pay

## 2020-04-12 ENCOUNTER — Telehealth (HOSPITAL_COMMUNITY): Payer: Self-pay | Admitting: Psychiatry

## 2020-04-12 NOTE — Telephone Encounter (Signed)
Called patient to advise appt cx due to provider medical issue. Pt advised in vm to f/u on next appt

## 2020-04-20 ENCOUNTER — Encounter (HOSPITAL_COMMUNITY): Payer: Self-pay | Admitting: Psychiatry

## 2020-04-20 ENCOUNTER — Ambulatory Visit (INDEPENDENT_AMBULATORY_CARE_PROVIDER_SITE_OTHER): Payer: Medicare Other | Admitting: Psychiatry

## 2020-04-20 ENCOUNTER — Other Ambulatory Visit: Payer: Self-pay

## 2020-04-20 DIAGNOSIS — F419 Anxiety disorder, unspecified: Secondary | ICD-10-CM

## 2020-04-20 DIAGNOSIS — F319 Bipolar disorder, unspecified: Secondary | ICD-10-CM

## 2020-04-20 NOTE — Progress Notes (Signed)
Virtual Visit via Video Note  I connected with Susan Ball on 04/20/20 at 12:00 PM EDT by a video enabled telemedicine application and verified that I am speaking with the correct person using two identifiers.  I discussed the limitations of evaluation and management by telemedicine and the availability of in person appointments. The patient expressed understanding and agreed to proceed.    Location: Patient: Patient Home Provider: Home Office   GROUP GOAL: Client will attend IOP Aftercare Group Therapy 2-4x a month to connect with peers and to apply strategies discussed to improve mental health condition.  History of Present Illness: Bipolar 1 DO and Anxiety  Observations/Objective: Counselor met with Patient in the context of Group Therapy, via Webex. Counselor prompted Patient to share updates on coping skill application and individual therapeutic process in management of mental health. Client reports intentional efforts to maintain mental health.  Counselor prompted Patient to share areas of concern, challenges and identify barriers to meeting current goals. Topics covered: life transitions, getting back engaged in personal interests and activities, safety plans and exit plans for challenging situatitons. Patient engaged in discussion, provided feedback for others within the group and took note of additional strategies reviewed and discussed.   Assessment and Plan: Counselor recommends client continue following treatment plan goals, following crisis plan and following up with behavioral health and medical providers as needed. Patient is welcome to join group at next session.   Follow Up Instructions: Counselor to provide link for next session via Webex platform. The patient was advised to call back or seek an in-person evaluation if the symptoms worsen or if the condition fails to improve as anticipated.  I provided 70 minutes of non-face-to-face time during this  encounter.   Lise Auer, LCSW

## 2020-04-24 ENCOUNTER — Other Ambulatory Visit (HOSPITAL_COMMUNITY): Payer: Self-pay | Admitting: Psychiatry

## 2020-04-24 DIAGNOSIS — F319 Bipolar disorder, unspecified: Secondary | ICD-10-CM

## 2020-04-26 ENCOUNTER — Ambulatory Visit (HOSPITAL_COMMUNITY): Payer: Medicare Other | Admitting: Psychiatry

## 2020-04-27 ENCOUNTER — Encounter (HOSPITAL_COMMUNITY): Payer: Self-pay | Admitting: Psychiatry

## 2020-04-27 ENCOUNTER — Ambulatory Visit (INDEPENDENT_AMBULATORY_CARE_PROVIDER_SITE_OTHER): Payer: Medicare Other | Admitting: Psychiatry

## 2020-04-27 ENCOUNTER — Other Ambulatory Visit: Payer: Self-pay

## 2020-04-27 DIAGNOSIS — F419 Anxiety disorder, unspecified: Secondary | ICD-10-CM | POA: Diagnosis not present

## 2020-04-27 DIAGNOSIS — F319 Bipolar disorder, unspecified: Secondary | ICD-10-CM

## 2020-04-27 NOTE — Progress Notes (Signed)
Virtual Visit via Video Note  I connected with Susan Ball on 04/27/20 at 12:00 PM EDT by a video enabled telemedicine application and verified that I am speaking with the correct person using two identifiers.  I discussed the limitations of evaluation and management by telemedicine and the availability of in person appointments. The patient expressed understanding and agreed to proceed.    Location: Patient: Patient Home Provider: Home Office   GROUP GOAL: Client will attend IOP Aftercare Group Therapy 2-4x a month to connect with peers and to apply strategies discussed to improve mental health condition.  History of Present Illness: Bipolar 1 DO and Anxiety  Observations/Objective: Counselor met with Patient in the context of Group Therapy, via Webex. Counselor prompted Patient to share updates on coping skill application and individual therapeutic process in management of mental health. Client reports intentional efforts to maintain mental health.  Counselor prompted Patient to share areas of concern, challenges and identify barriers to meeting current goals. Topics covered: survivor anniversaries, sobriety, assertive communication, boundaries, medication management, coping skill application, PTSD symptoms, and stress management. Patient engaged in discussion, provided feedback for others within the group and took note of additional strategies reviewed and discussed.   Assessment and Plan: Counselor recommends client continue following treatment plan goals, following crisis plan and following up with behavioral health and medical providers as needed. Patient is welcome to join group at next session.   Follow Up Instructions: Counselor to provide link for next session via Webex platform. The patient was advised to call back or seek an in-person evaluation if the symptoms worsen or if the condition fails to improve as anticipated.  I provided 80 minutes of non-face-to-face time  during this encounter.   Lise Auer, LCSW

## 2020-04-29 ENCOUNTER — Other Ambulatory Visit (HOSPITAL_COMMUNITY): Payer: Self-pay | Admitting: *Deleted

## 2020-04-29 DIAGNOSIS — F319 Bipolar disorder, unspecified: Secondary | ICD-10-CM

## 2020-04-29 MED ORDER — REXULTI 0.5 MG PO TABS: 0.5000 mg | ORAL_TABLET | Freq: Every day | ORAL | 0 refills | Status: DC

## 2020-05-04 ENCOUNTER — Other Ambulatory Visit: Payer: Self-pay

## 2020-05-04 ENCOUNTER — Encounter (HOSPITAL_COMMUNITY): Payer: Self-pay | Admitting: Psychiatry

## 2020-05-04 ENCOUNTER — Ambulatory Visit (INDEPENDENT_AMBULATORY_CARE_PROVIDER_SITE_OTHER): Payer: Medicare Other | Admitting: Psychiatry

## 2020-05-04 DIAGNOSIS — F319 Bipolar disorder, unspecified: Secondary | ICD-10-CM | POA: Diagnosis not present

## 2020-05-04 NOTE — Progress Notes (Signed)
Virtual Visit via Video Note  I connected with Susan Ball on 05/04/20 at 12:00 PM EDT by a video enabled telemedicine application and verified that I am speaking with the correct person using two identifiers.  I discussed the limitations of evaluation and management by telemedicine and the availability of in person appointments. The patient expressed understanding and agreed to proceed.    Location: Patient: Patient Home Provider: Home Office   GROUP GOAL: Client will attend IOP Aftercare Group Therapy 2-4x a month to connect with peers and to apply strategies discussed to improve mental health condition.  History of Present Illness: Bipolar 1 DO  Observations/Objective: Counselor met with Patient in the context of Group Therapy, via Webex. Counselor prompted Patient to share updates on coping skill application and individual therapeutic process in management of mental health. Client reports intentional efforts to maintain mental health.  Counselor prompted Patient to share areas of concern, challenges and identify barriers to meeting current goals. Topics covered: preparing for stressful events, challenging self to change habits, future planning, communicating needs, and medication compliance. Patient engaged in discussion, provided feedback for others within the group and took note of additional strategies reviewed and discussed.   Assessment and Plan: Counselor recommends client continue following treatment plan goals, following crisis plan and following up with behavioral health and medical providers as needed. Patient is welcome to join group at next session.   Follow Up Instructions: Counselor to provide link for next session via Webex platform. The patient was advised to call back or seek an in-person evaluation if the symptoms worsen or if the condition fails to improve as anticipated.  I provided 80 minutes of non-face-to-face time during this encounter.   Lise Auer,  LCSW

## 2020-05-07 ENCOUNTER — Other Ambulatory Visit: Payer: Self-pay

## 2020-05-07 ENCOUNTER — Ambulatory Visit (INDEPENDENT_AMBULATORY_CARE_PROVIDER_SITE_OTHER): Payer: Medicare Other | Admitting: Psychiatry

## 2020-05-07 DIAGNOSIS — F319 Bipolar disorder, unspecified: Secondary | ICD-10-CM | POA: Diagnosis not present

## 2020-05-07 NOTE — Progress Notes (Signed)
Virtual Visit via Video Note  I connected with [name] on 05/07/20 at 11:12 AM EDT  by a video enabled telemedicine application and verified that I am speaking with the correct person using two identifiers.  Location: Patient: Home Provider: [facility] Outpatient [place] office    I discussed the limitations of evaluation and management by telemedicine and the availability of in person appointments. The patient expressed understanding and agreed to proceed.   I provided 42 minutes of non-face-to-face time during this encounter.   [name], LCSW   THERAPIST PROGRESS NOTE            Session Time: Friday 05/07/2020 11:12 AM - 11:54 AM   Participation Level: Active  Behavioral Response: CasualAlert/depressed   Type of Therapy: Individual Therapy  Treatment Goals addressed: Patient states wanting to stop being so depressed all the time, wanting to work on her attitude/ Alleviate symptoms of depression,resume normal interest in activities and socialization  Interventions: CBT and Supportive  Summary: Susan Ball is a 50 y.o. female who p is referred for services by Susan Ball. She just completed IOP on March 27, 2019. She has long standing history of bipolar disorder and borderline personality disorder. She has had 3 psychiatric hospitalizations. The last was in 2010 at [facility] due to suicide attempt. She participated in outpatient therapy intermittently at [facility] for 3 years. She last was seen there 6 months ago.  Current symptoms include depressed mood, crying spells, mood swings, aggression, anger, excessive worry, sleep difficulty, poor concentration, and memory difficulty.  Patient was seen via virtual visit about 5-6  weeks ago.  She has [facility]  [facility].  Patient reports this has been helpful.  It has helped her maintain structure and routine.  She also reports decreased marijuana use after discussing some of her concerns in the group.  She  reports she has been using daily activity planning since last session in individual therapy.  She still is not working but has been attending the gym regularly.  She has been exercising, swimming, and attending Zumba classes.  However, she expresses frustration as she has gained 13 pounds since starting Rexulti.  She reports feeling tired and sluggish.  She also has thoughts of being repulsive to her husband as well as others.  She also fears continued weight gain and adverse effect on her glucose levels.  She reports decreased marital stress as she and her husband have improved their communication and are trying to work on their marriage.  She also states trying to let go of her son and respect his boundaries.  She reports realizing he has to face the consequences of his own behavior.  She no longer tries to constantly contact son but waits for him to contact or visit.  Patient still is not working and says she misses work.  She wants to be assigned another patient.     Suicidal/Homicidal: NO.  No plan, no intent.  Patient agrees to call this practice, call 911, or have someone take her to the ER should symptoms worsen.  Therapist Response:  reviewed symptoms, administered PHQ 2 and 9 along with C-SSRS, reviewdsafety issues/replacement behaviors and distracting activities/confirmed with patient hotline and crisis numbers, praised and reinforced patient's attendance in the BH-IOP aftercare group, assisted patient identify benefits of attending group, praised and reinforced patient's decreased marijuana use and her efforts to use daily planning, develop plan with patient to continue using daily planning and bring daily planning forms  to next session, discussed stressors, facilitated expression of thoughts and feelings, validated feelings, assisted patient identify, challenge, and replace unhelpful thought patterns about husband's possible opinions about her weight, developed plan with patient to discuss concerns  about weight gain with Dr. Lolly Ball at next appointment   Plan: Return again in 2  weeks.  Diagnosis: Axis I: Bipolar Disorder    Axis II: Borderline Personality Dis.    [name], LCSW 05/07/2020

## 2020-05-10 ENCOUNTER — Ambulatory Visit (HOSPITAL_COMMUNITY): Payer: Medicare Other | Admitting: Psychiatry

## 2020-05-14 ENCOUNTER — Telehealth (HOSPITAL_COMMUNITY): Payer: Self-pay | Admitting: *Deleted

## 2020-05-14 DIAGNOSIS — S46811A Strain of other muscles, fascia and tendons at shoulder and upper arm level, right arm, initial encounter: Secondary | ICD-10-CM | POA: Diagnosis not present

## 2020-05-14 NOTE — Telephone Encounter (Signed)
Pt called with c/o what sounds like EPS r/t Rexulti. Pt describes "stiffness" in her arms, chest and neck. Particularly right arm hard to move and neck very stiff. Pt said she made an appointment with PCP for 1200 today. Nurse advised pt to hold dose of Rexulti today and take Benadryl, which she has on hand. Pt also states that she has gained 17 lbs as well since starting medication,  And has been experiencing  constipation unrelieved by Miralax since starting Rexulti.  Please advise. Does she still need to see PCP today? I advised to keep until I spoke with you.

## 2020-05-14 NOTE — Telephone Encounter (Signed)
Discontinue Rexulti.  She can take Benadryl or Cogentin 1 mg to be given now.  She should see her PCP to address her constipation and weight gain.  She also need a lithium level which can be done at PCP office.  Her lithium level fluctuates ranging from 1.4-0.5.

## 2020-05-18 ENCOUNTER — Telehealth (HOSPITAL_COMMUNITY): Payer: Medicare Other | Admitting: Psychiatry

## 2020-05-18 ENCOUNTER — Other Ambulatory Visit: Payer: Self-pay

## 2020-05-18 ENCOUNTER — Ambulatory Visit (INDEPENDENT_AMBULATORY_CARE_PROVIDER_SITE_OTHER): Payer: Medicare Other | Admitting: Psychiatry

## 2020-05-18 DIAGNOSIS — F319 Bipolar disorder, unspecified: Secondary | ICD-10-CM | POA: Diagnosis not present

## 2020-05-19 ENCOUNTER — Encounter (HOSPITAL_COMMUNITY): Payer: Self-pay | Admitting: Psychiatry

## 2020-05-19 ENCOUNTER — Other Ambulatory Visit: Payer: Self-pay

## 2020-05-19 ENCOUNTER — Telehealth (INDEPENDENT_AMBULATORY_CARE_PROVIDER_SITE_OTHER): Payer: Medicare Other | Admitting: Psychiatry

## 2020-05-19 VITALS — Wt 264.0 lb

## 2020-05-19 DIAGNOSIS — F6089 Other specific personality disorders: Secondary | ICD-10-CM

## 2020-05-19 DIAGNOSIS — F419 Anxiety disorder, unspecified: Secondary | ICD-10-CM

## 2020-05-19 DIAGNOSIS — F319 Bipolar disorder, unspecified: Secondary | ICD-10-CM

## 2020-05-19 MED ORDER — FLUOXETINE HCL 40 MG PO CAPS: 40.0000 mg | ORAL_CAPSULE | Freq: Every day | ORAL | 0 refills | Status: DC

## 2020-05-19 MED ORDER — CARIPRAZINE HCL 1.5 MG PO CAPS: 1.5000 mg | ORAL_CAPSULE | Freq: Every day | ORAL | 2 refills | Status: DC

## 2020-05-19 MED ORDER — CLONIDINE HCL 0.1 MG PO TABS: 0.1000 mg | ORAL_TABLET | Freq: Every day | ORAL | 0 refills | Status: DC

## 2020-05-19 MED ORDER — LITHIUM CARBONATE ER 300 MG PO TBCR: EXTENDED_RELEASE_TABLET | ORAL | 2 refills | Status: DC

## 2020-05-19 MED ORDER — FLUOXETINE HCL 20 MG PO CAPS: 20.0000 mg | ORAL_CAPSULE | Freq: Every day | ORAL | 0 refills | Status: DC

## 2020-05-19 NOTE — Progress Notes (Signed)
Virtual Visit via Video Note  I connected with Susan Ball on 05/19/20 at 12:00 PM EDT by a video enabled telemedicine application and verified that I am speaking with the correct person using two identifiers.  I discussed the limitations of evaluation and management by telemedicine and the availability of in person appointments. The patient expressed understanding and agreed to proceed.    Location: Patient: Patient Home Provider: Home Office   GROUP GOAL: Client will attend IOP Aftercare Group Therapy 2-4x a month to connect with peers and to apply strategies discussed to improve mental health condition.  History of Present Illness: Bipolar 1 DO  Observations/Objective: Counselor met with Patient in the context of Group Therapy, via Webex. Counselor prompted Patient to share updates on coping skill application and individual therapeutic process in management of mental health. Client reports intentional efforts to maintain mental health.  Counselor prompted Patient to share areas of concern, challenges and identify barriers to meeting current goals. Topics covered: potential treatments and engagement, respite, "adulting", stress management, employment issues, improvement in relationships through behavior change, and managing mental health through litigation trials. Patient engaged in discussion, provided feedback for others within the group and took note of additional strategies reviewed and discussed.   Assessment and Plan: Counselor recommends client continue following treatment plan goals, following crisis plan and following up with behavioral health and medical providers as needed. Patient is welcome to join group at next session.   Follow Up Instructions: Counselor to provide link for next session via Webex platform. The patient was advised to call back or seek an in-person evaluation if the symptoms worsen or if the condition fails to improve as anticipated.  I provided 70  minutes of non-face-to-face time during this encounter.   Lise Auer, LCSW

## 2020-05-19 NOTE — Progress Notes (Signed)
Virtual Visit via Telephone Note  I connected with Susan Ball on 05/19/20 at  2:20 PM EDT by telephone and verified that I am speaking with the correct person using two identifiers.  Location: Patient: Home Provider: Home Office   I discussed the limitations, risks, security and privacy concerns of performing an evaluation and management service by telephone and the availability of in person appointments. I also discussed with the patient that there may be a patient responsible charge related to this service. The patient expressed understanding and agreed to proceed.   History of Present Illness: Patient is evaluated by phone session.  She stopped taking Rexulti because of EPS, weight gain and constipation.  Patient told she was doing better and noticed improvement in her mood but worried about stiffness and weight gain.  She gained 10 pounds.  She was told to stop the REXULTI and try Cogentin but patient went to see her PCP who prescribed muscle relaxant.  She is doing better.  Since she had stopped the REXULTI she noticed her appetite getting under control.  She is not impulsive eating.  She noticed her symptoms are stable but worried about not having REXULTI may cause worsening of symptoms in the future.  She is relieved that she has no more legal issues from her ex and her son's father.  Her son is living on his own.  Patient has chronic symptoms including trust issues and paranoia but lately she feels things are going well.  She is not involved in any self abusive behavior.  She denies any hallucination, suicidal thoughts or any aggression of violence.  She forgot to have a blood work but like to see her PCP again for physical and complete blood work.  She is compliant with lithium, clonidine and Prozac.  She denies drinking or using any illegal substances.  Past Psychiatric History: H/O Bipolar, anxiety and borderline traits. Taking medssince age 65.H/O multiple inpatient and suicidal  attempt.H/Ooverdose, jump from the car and banging head. Tried Lamictal(rash),Seroquel, Abilify,doxepin, Trazadone and hydroxyzine.H/O n/c with meds,refusing to eat and needed PICC line.H/O anger, road rage, paranoia and trust issues. Had DBT with good response.   Psychiatric Specialty Exam: Physical Exam  Review of Systems  Weight 264 lb (119.7 kg).There is no height or weight on file to calculate BMI.  General Appearance: NA  Eye Contact:  NA  Speech:  Clear and Coherent  Volume:  Normal  Mood:  Euthymic  Affect:  NA  Thought Process:  Goal Directed  Orientation:  Full (Time, Place, and Person)  Thought Content:  Rumination  Suicidal Thoughts:  No  Homicidal Thoughts:  No  Memory:  Immediate;   Good Recent;   Good Remote;   Fair  Judgement:  Intact  Insight:  Shallow  Psychomotor Activity:  NA  Concentration:  Concentration: Fair and Attention Span: Fair  Recall:  Good  Fund of Knowledge:  Good  Language:  Good  Akathisia:  No  Handed:  Right  AIMS (if indicated):     Assets:  Communication Skills Desire for Improvement Housing Resilience  ADL's:  Intact  Cognition:  WNL  Sleep:   fair      Assessment and Plan: Bipolar disorder type I.  Anxiety.  Cluster B traits.  Discontinue Rexulti as patient reported constipation, muscle stiffness and excessive weight gain.  She is feeling better now but like to try a different medication.  I recommend try low-dose Vraylar 1.5 mg.  Discussed medication side effects and  benefits.  Patient is in therapy with [name] and also with aftercare group with [facility].  I reminded that she should contact her PCP to get blood work and repeat lithium level with hemoglobin A1c.  Patient agreed to follow up with the recommendation.  Patient like to follow-up in 3 months.  I will continue lithium 300 mg twice a day, Prozac 60 mg daily, clonidine 0.1 mg at bedtime and regular 1.5 mg at bedtime.  Recommended to call us back if she is any  question, concern or if she feels worsening of the symptoms.  Follow Up Instructions:    I discussed the assessment and treatment plan with the patient. The patient was provided an opportunity to ask questions and all were answered. The patient agreed with the plan and demonstrated an understanding of the instructions.   The patient was advised to call back or seek an in-person evaluation if the symptoms worsen or if the condition fails to improve as anticipated.  I provided 18 minutes of non-face-to-face time during this encounter.   Cleotis Nipper, MD

## 2020-05-21 ENCOUNTER — Other Ambulatory Visit: Payer: Self-pay

## 2020-05-21 ENCOUNTER — Ambulatory Visit (INDEPENDENT_AMBULATORY_CARE_PROVIDER_SITE_OTHER): Payer: Medicare Other | Admitting: Psychiatry

## 2020-05-21 DIAGNOSIS — F319 Bipolar disorder, unspecified: Secondary | ICD-10-CM

## 2020-05-21 NOTE — Progress Notes (Signed)
Virtual Visit via Video Note  I connected with Susan Ball on 05/21/20 at 11:00 AM EDT  by a video enabled telemedicine application and verified that I am speaking with the correct person using two identifiers.  Location: Patient: Home Provider: [facility] Outpatient [place] office    I discussed the limitations of evaluation and management by telemedicine and the availability of in person appointments. The patient expressed understanding and agreed to proceed.   I provided 50  minutes of non-face-to-face time during this encounter.   [name], LCSW   THERAPIST PROGRESS NOTE            Session Time: Friday 05/21/2020 11:08 AM - 11:58 AM   Participation Level: Active  Behavioral Response: CasualAlert/less depressed but sad,  Type of Therapy: Individual Therapy  Treatment Goals addressed: Patient states wanting to stop being so depressed all the time, wanting to work on her attitude/ Alleviate symptoms of depression,resume normal interest in activities and socialization  Interventions: CBT and Supportive  Summary: Susan Ball is a 50 y.o. female who p is referred for services by [name]. She just completed IOP on March 27, 2019. She has long standing history of bipolar disorder and borderline personality disorder. She has had 3 psychiatric hospitalizations. The last was in 2010 at [facility] due to suicide attempt. She participated in outpatient therapy intermittently at [facility] for 3 years. She last was seen there 6 months ago.  Current symptoms include depressed mood, crying spells, mood swings, aggression, anger, excessive worry, sleep difficulty, poor concentration, and memory difficulty.  Patient was seen via virtual visit about two weeks ago.  She continues to experience symptoms of depression but reports mainly being sad today.  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  Patient reports feelings of loss and rejection.  Per her report, she coped with this initially by using marijuana.  However, she has resumed use of more helpful coping strategies such as still attending the gym, going to [employer] classes, and going swimming.  She discussed her concerns about medication with psychiatrist Dr. Lolly Mustache and reports no longer taking Rexult per his instructions.  She will begin taking Vraylar once approved by insurance carrier.  Patient is pleased that she now feels more in control of her appetite.  She continues to attend BH-IOP aftercare group and reports this remains helpful.  Patient also has been using daily planning consistently.    Suicidal/Homicidal:  No,No plan, no intent.    Therapist Response:  reviewed symptoms, praised and reinforced patient efforts to express concerns to psychiatrist/continued participation in BH-IOP aftercare group/use of daily planning, developed plan with patient to continue using daily planning, facilitated patient expressing thoughts and feelings about recent incident regarding her job, reviewed states of mind (reasonable mind/ emotion mind/ wise mind) reviewed strategies to cope with distressful feelings   plan: Return again in 2  weeks.  Diagnosis: Axis I: Bipolar Disorder    Axis II: Borderline Personality Dis.    [name], LCSW 05/21/2020

## 2020-05-25 ENCOUNTER — Encounter (HOSPITAL_COMMUNITY): Payer: Self-pay | Admitting: Psychiatry

## 2020-05-25 ENCOUNTER — Other Ambulatory Visit: Payer: Self-pay

## 2020-05-25 ENCOUNTER — Ambulatory Visit (INDEPENDENT_AMBULATORY_CARE_PROVIDER_SITE_OTHER): Payer: Medicare Other | Admitting: Psychiatry

## 2020-05-25 DIAGNOSIS — F319 Bipolar disorder, unspecified: Secondary | ICD-10-CM

## 2020-05-25 NOTE — Progress Notes (Signed)
Virtual Visit via Video Note  I connected with Susan Ball on 05/25/20 at 12:00 PM EDT by a video enabled telemedicine application and verified that I am speaking with the correct person using two identifiers.  I discussed the limitations of evaluation and management by telemedicine and the availability of in person appointments. The patient expressed understanding and agreed to proceed.    Location: Patient: Patient Home Provider: Home Office   GROUP GOAL: Client will attend IOP Aftercare Group Therapy 2-4x a month to connect with peers and to apply strategies discussed to improve mental health condition.  History of Present Illness: Bipolar 1 DO  Observations/Objective: Counselor met with Patient in the context of Group Therapy, via Webex. Counselor prompted Patient to share updates on coping skill application and individual therapeutic process in management of mental health. Client reports intentional efforts to maintain mental health.  Counselor prompted Patient to share areas of concern, challenges and identify barriers to meeting current goals. Topics covered: avoidance, cognitive distortions, anxiety coping strategies, work related stressors, mental health triggers by family, and how physical health impact medical health. Patient engaged in discussion, provided feedback for others within the group and took note of additional strategies reviewed and discussed.   Assessment and Plan: Counselor recommends client continue following treatment plan goals, following crisis plan and following up with behavioral health and medical providers as needed. Patient is welcome to join group at next session.   Follow Up Instructions: Counselor to provide link for next session via Webex platform. The patient was advised to call back or seek an in-person evaluation if the symptoms worsen or if the condition fails to improve as anticipated.  I provided 90 minutes of non-face-to-face time during  this encounter.   Lise Auer, LCSW

## 2020-06-01 ENCOUNTER — Ambulatory Visit (HOSPITAL_COMMUNITY): Payer: Medicare Other | Admitting: Psychiatry

## 2020-06-01 ENCOUNTER — Other Ambulatory Visit: Payer: Self-pay

## 2020-06-01 DIAGNOSIS — F319 Bipolar disorder, unspecified: Secondary | ICD-10-CM

## 2020-06-02 ENCOUNTER — Encounter (HOSPITAL_COMMUNITY): Payer: Self-pay | Admitting: Psychiatry

## 2020-06-02 NOTE — Progress Notes (Signed)
Client made Counselor aware that she was unable to participate in group today, due to a family emergency. Client to attend next week and reach out to share needs between sessions.   Hilbert Odor, LCSW

## 2020-06-04 ENCOUNTER — Other Ambulatory Visit: Payer: Self-pay

## 2020-06-04 ENCOUNTER — Ambulatory Visit (INDEPENDENT_AMBULATORY_CARE_PROVIDER_SITE_OTHER): Payer: Medicare Other | Admitting: Psychiatry

## 2020-06-04 DIAGNOSIS — F319 Bipolar disorder, unspecified: Secondary | ICD-10-CM | POA: Diagnosis not present

## 2020-06-04 NOTE — Progress Notes (Signed)
Virtual Visit via Video Note  I connected with Susan Ball on 06/04/20 at 11:09 AM EDT  by a video enabled telemedicine application and verified that I am speaking with the correct person using two identifiers.  Location: Patient: Home Provider: [facility] Outpatient     I discussed the limitations of evaluation and management by telemedicine and the availability of in person appointments. The patient expressed understanding and agreed to proceed.  I provided 43 minutes of non-face-to-face time during this encounter.   [name], LCSW  THERAPIST PROGRESS NOTE            Session Time: Friday 06/04/2020 11:09 AM -  11:52 AM   Participation Level: Active  Behavioral Response: CasualAlert/depressed/tearful  Type of Therapy: Individual Therapy  Treatment Goals addressed: Patient states wanting to stop being so depressed all the time, wanting to work on her attitude/ Alleviate symptoms of depression,resume normal interest in activities and socialization  Interventions: CBT and Supportive  Summary: Susan Ball is a 50 y.o. female who p is referred for services by [name]. She just completed IOP on March 27, 2019. She has long standing history of bipolar disorder and borderline personality disorder. She has had 3 psychiatric hospitalizations. The last was in 2010 at [facility] due to suicide attempt. She participated in outpatient therapy intermittently at [facility] for 3 years. She last was seen there 6 months ago.  Current symptoms include depressed mood, crying spells, mood swings, aggression, anger, excessive worry, sleep difficulty, poor concentration, and memory difficulty.  Patient was seen via virtual visit about two weeks ago.  She reports increased depressed mood and is very tearful in session today.  Per patient's report, she still is waiting for insurance company to approve the use of Vraylar to replace Rexulti.  Patient reports she has not taking Rexulti in 2 weeks.   She has contacted her pharmacy.  Patient reports she has been smoking 2 blunts of marijuana per day to try to cope.  Patient also reports increased stress and sadness triggered by the relationship with her son as he did not call her until late evening on Mother's Day.  Per patient's report, her son is upset with her because she did have the funds to help him pay for his last ticket.  Patient expresses hurt as she and son are not as close as they used to be and he has blocked her phone number.  Patient continues to remain active in the mornings by attending the gym, swimming, going and going to Zumba classes.  She reports little involvement in activity in the afternoons.  She reports she continues to attend BH-IOP aftercare group.   Suicidal/Homicidal: Patient reports passive thoughts of wishing she was dead but denies any plan and any intent to harm self.  Patient agrees to use distracting activities, talk to her husband, for support.  She also agrees to call 911 or have someone take her to the ER should symptoms worsen.  Patient has crisis contact information  Therapist Response:  reviewed symptoms, administered PHQ 2 and 9 with C-SS RS, discussed stressors, facilitated expression of thoughts and feelings, validated feelings, praised and reinforced patient's efforts to try to set maintain limits with son, assisted patient identify/challenge/and replace thoughts evoking inappropriate guilt, assisted patient identify ways to use her spirituality to cope and embrace her value of being a mother, develoedp plan with patient to contact Dr. Sheela Stack office regarding assistance with medication, praised and reinforced patient's continued efforts to maintain  involvement in activity in the mornings, reviewed role of daily planning, developed plan with patient to use daily planning to increase involvement in activity in the afternoons, bring completed forms to next session.   plan: Return again in 2   weeks.  Diagnosis: Axis I: Bipolar Disorder    Axis II: Borderline Personality Dis.    [name], LCSW 06/04/2020

## 2020-06-07 ENCOUNTER — Telehealth (HOSPITAL_COMMUNITY): Payer: Self-pay

## 2020-06-07 NOTE — Telephone Encounter (Signed)
PA started. Sent for Review/Waiting for determination

## 2020-06-08 ENCOUNTER — Telehealth (HOSPITAL_COMMUNITY): Payer: Self-pay

## 2020-06-08 ENCOUNTER — Ambulatory Visit (INDEPENDENT_AMBULATORY_CARE_PROVIDER_SITE_OTHER): Payer: Medicare Other | Admitting: Psychiatry

## 2020-06-08 DIAGNOSIS — F319 Bipolar disorder, unspecified: Secondary | ICD-10-CM

## 2020-06-08 DIAGNOSIS — F419 Anxiety disorder, unspecified: Secondary | ICD-10-CM | POA: Diagnosis not present

## 2020-06-08 NOTE — Telephone Encounter (Signed)
[employer] PRESCRIPTION COVERAGE APPROVED  VRAYLAR 1.5MG  CAPSULE REFERENCE # [id number] EFFECTIVE 06/07/2020 TO 01/22/2021  S/W RAVEN NOTIFIED PHARMACY & PATIENT

## 2020-06-09 ENCOUNTER — Other Ambulatory Visit: Payer: Self-pay

## 2020-06-11 ENCOUNTER — Encounter (HOSPITAL_COMMUNITY): Payer: Self-pay | Admitting: Psychiatry

## 2020-06-11 NOTE — Progress Notes (Signed)
Virtual Visit via Video Note  I connected with Susan Ball on 06/11/20 at 12:00 PM EDT by a video enabled telemedicine application and verified that I am speaking with the correct person using two identifiers.  I discussed the limitations of evaluation and management by telemedicine and the availability of in person appointments. The patient expressed understanding and agreed to proceed.    Location: Patient: Patient Home Provider: Home Office   GROUP GOAL: Client will attend IOP Aftercare Group Therapy 2-4x a month to connect with peers and to apply strategies discussed to improve mental health condition.  History of Present Illness: Bipolar DO  Observations/Objective: Counselor met with Patient in the context of Group Therapy, via Webex. Counselor prompted Patient to share updates on coping skill application and individual therapeutic process in management of mental health. Client reports intentional efforts to maintain mental health.  Counselor prompted Patient to share areas of concern, challenges and identify barriers to meeting current goals. Topics covered: self-advocacy, medication management, historical racial trauma, mental health and violence, myth of MH and boundaries. Patient engaged in discussion, provided feedback for others within the group and took note of additional strategies reviewed and discussed.   Assessment and Plan: Counselor recommends client continue following treatment plan goals, following crisis plan and following up with behavioral health and medical providers as needed. Patient is welcome to join group at next session.   Follow Up Instructions: Counselor to provide link for next session via Webex platform. The patient was advised to call back or seek an in-person evaluation if the symptoms worsen or if the condition fails to improve as anticipated.  I provided 75 minutes of non-face-to-face time during this encounter.   Lise Auer, LCSW

## 2020-06-15 ENCOUNTER — Other Ambulatory Visit: Payer: Self-pay

## 2020-06-15 ENCOUNTER — Ambulatory Visit (INDEPENDENT_AMBULATORY_CARE_PROVIDER_SITE_OTHER): Payer: Medicare Other | Admitting: Psychiatry

## 2020-06-15 DIAGNOSIS — F319 Bipolar disorder, unspecified: Secondary | ICD-10-CM | POA: Diagnosis not present

## 2020-06-17 ENCOUNTER — Encounter (HOSPITAL_COMMUNITY): Payer: Self-pay | Admitting: Psychiatry

## 2020-06-17 NOTE — Progress Notes (Signed)
Virtual Visit via Video Note  I connected with Susan Ball on 06/15/20 at 12:00 PM EDT by a video enabled telemedicine application and verified that I am speaking with the correct person using two identifiers.  I discussed the limitations of evaluation and management by telemedicine and the availability of in person appointments. The patient expressed understanding and agreed to proceed.    Location: Patient: Patient Home Provider: Home Office   GROUP GOAL: Client will attend IOP Aftercare Group Therapy 2-4x a month to connect with peers and to apply strategies discussed to improve mental health condition.  History of Present Illness: Bipolar I DO   Observations/Objective: Counselor met with Patient in the context of Group Therapy, via Webex. Counselor prompted Patient to share updates on coping skill application and individual therapeutic process in management of mental health. Client reports intentional efforts to maintain mental health.  Counselor prompted Patient to share areas of concern, challenges and identify barriers to meeting current goals. Topics covered: holistic approach to care, alternative coping skills, behavior management, group support, labeling hope, transitioning care when provider retires, and depressive symptom management. Patient engaged in discussion, provided feedback for others within the group and took note of additional strategies reviewed and discussed.   Assessment and Plan: Counselor recommends client continue following treatment plan goals, following crisis plan and following up with behavioral health and medical providers as needed. Patient is welcome to join group at next session.   Follow Up Instructions: Counselor to provide link for next session via Webex platform. The patient was advised to call back or seek an in-person evaluation if the symptoms worsen or if the condition fails to improve as anticipated.  I provided 80 minutes of  non-face-to-face time during this encounter.   Lise Auer, LCSW

## 2020-06-18 ENCOUNTER — Ambulatory Visit (INDEPENDENT_AMBULATORY_CARE_PROVIDER_SITE_OTHER): Payer: Medicare Other | Admitting: Psychiatry

## 2020-06-18 ENCOUNTER — Other Ambulatory Visit: Payer: Self-pay

## 2020-06-18 DIAGNOSIS — F319 Bipolar disorder, unspecified: Secondary | ICD-10-CM | POA: Diagnosis not present

## 2020-06-18 DIAGNOSIS — F419 Anxiety disorder, unspecified: Secondary | ICD-10-CM | POA: Diagnosis not present

## 2020-06-18 NOTE — Progress Notes (Signed)
Virtual Visit via Video Note  I connected with Susan Ball on 06/18/20 at 11:03 AM EDT by a video enabled telemedicine application and verified that I am speaking with the correct person using two identifiers.  Location: Patient: Home Provider: [facility] [place] office    I discussed the limitations of evaluation and management by telemedicine and the availability of in person appointments. The patient expressed understanding and agreed to proceed.  I provided 31 minutes of non-face-to-face time during this encounter.   [name], LCSW  THERAPIST PROGRESS NOTE            Session Time: Friday 06/18/2020 11:03 AM - 11:34 AM   Participation Level: Active  Behavioral Response: CasualAlert/depressed/tearful  Type of Therapy: Individual Therapy  Treatment Goals addressed: Patient states wanting to stop being so depressed all the time, wanting to work on her attitude/ Alleviate symptoms of depression,resume normal interest in activities and socialization  Interventions: CBT and Supportive  Summary: Susan Ball is a 50 y.o. female who p is referred for services by [name]. She just completed IOP on March 27, 2019. She has long standing history of bipolar disorder and borderline personality disorder. She has had 3 psychiatric hospitalizations. The last was in 2010 at [facility] due to suicide attempt. She participated in outpatient therapy intermittently at [facility] for 3 years. She last was seen there 6 months ago.  Current symptoms include depressed mood, crying spells, mood swings, aggression, anger, excessive worry, sleep difficulty, poor concentration, and memory difficulty.  Patient was seen via virtual visit about two weeks ago.  She reports improved mood since last session.  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  She works 2 days a week.  She states feeling much better since returning to work.  She  continues to attend the gym, participating Zumba, and swims the other days of the week.  She reports healthy eating patterns and improved sleep pattern.  She continues to attend the [facility] aftercare group and reports this remains helpful.  She continues to use marijuana and reports smoking 1-2 blunts per day.  Per patient's report, her insurance finally approved use of Vraylar 2 days ago.  She plans to pick up the medication today.  Patient denies having any suicidal thoughts since last session.  She continues to report some stress regarding her son but has been able to set and maintain limits.  She reports this can be very difficult at times when her son is pressuring her to help her financially.     Suicidal/Homicidal: Patient denies any suicidal thoughts, any plan and any intent to harm self since last session.    Therapist Response:  reviewed symptoms, praised and reinforced patient's return to work and increased involvement in activity, discussed effects, discussed concerns about mixing medication with marijuana use, also discussed possible negative consequences for continued marijuana use, encouraged patient to begin taking medication as instructed by psychiatrist Dr. Lolly Mustache, also reviewed rationale for practicing deep breathing to trigger relaxation response when experiencing anxiety and stress, developed plan with patient to resume practicing regularly,  discussed stressors, facilitated expression of thoughts and feelings, validated feelings, praised and reinforced patient's efforts to set maintain limits with son, discussed ways to maintain limits with son when it is difficult by disengaging and telling son she will talk with him later giving patient time to calm self and make wise choices, developed plan with patient to continue using  daily planning    plan: Return again in 2  weeks.  Diagnosis: Axis I: Bipolar Disorder    Axis II: Borderline Personality Dis.    [name],  LCSW 06/18/2020

## 2020-06-29 ENCOUNTER — Encounter (HOSPITAL_COMMUNITY): Payer: Self-pay | Admitting: Psychiatry

## 2020-06-29 ENCOUNTER — Other Ambulatory Visit: Payer: Self-pay

## 2020-06-29 ENCOUNTER — Ambulatory Visit (INDEPENDENT_AMBULATORY_CARE_PROVIDER_SITE_OTHER): Payer: Medicare Other | Admitting: Psychiatry

## 2020-06-29 DIAGNOSIS — F319 Bipolar disorder, unspecified: Secondary | ICD-10-CM

## 2020-06-29 NOTE — Progress Notes (Signed)
Virtual Visit via Video Note  I connected with Susan Ball on 06/29/20 at 12:00 PM EDT by a video enabled telemedicine application and verified that I am speaking with the correct person using two identifiers.  I discussed the limitations of evaluation and management by telemedicine and the availability of in person appointments. The patient expressed understanding and agreed to proceed.    Location: Patient: Patient Home Provider: Home Office   GROUP GOAL: Client will attend IOP Aftercare Group Therapy 2-4x a month to connect with peers and to apply strategies discussed to improve mental health condition.  History of Present Illness: Bipolar DO 1  Observations/Objective: Counselor met with Patient in the context of Group Therapy, via Webex. Counselor prompted Patient to share updates on coping skill application and individual therapeutic process in management of mental health. Client reports intentional efforts to maintain mental health.  Counselor prompted Patient to share areas of concern, challenges and identify barriers to meeting current goals. Topics covered: medication changes/issues, worthlessness, physical activity, strategies for combating overspending, grief and loss, and life transitions.  Patient engaged in discussion, provided feedback for others within the group and took note of additional strategies reviewed and discussed.   Assessment and Plan: Counselor recommends client continue following treatment plan goals, following crisis plan and following up with behavioral health and medical providers as needed. Patient is welcome to join group at next session.   Follow Up Instructions: Counselor to provide link for next session via Webex platform. The patient was advised to call back or seek an in-person evaluation if the symptoms worsen or if the condition fails to improve as anticipated.  I provided 90 minutes of non-face-to-face time during this encounter.   Lise Auer, LCSW

## 2020-07-02 ENCOUNTER — Ambulatory Visit (INDEPENDENT_AMBULATORY_CARE_PROVIDER_SITE_OTHER): Payer: Medicare Other | Admitting: Psychiatry

## 2020-07-02 ENCOUNTER — Other Ambulatory Visit: Payer: Self-pay

## 2020-07-02 DIAGNOSIS — F319 Bipolar disorder, unspecified: Secondary | ICD-10-CM | POA: Diagnosis not present

## 2020-07-02 NOTE — Progress Notes (Signed)
Virtual Visit via Video Note  I connected with [name] on 07/02/20 at 11:10 AM EDT  by a video enabled telemedicine application and verified that I am speaking with the correct person using two identifiers.  Location: Patient: Home Provider: [facility] [place] office    I discussed the limitations of evaluation and management by telemedicine and the availability of in person appointments. The patient expressed understanding and agreed to proceed.  I provided 36 minutes of non-face-to-face time during this encounter.   Susan Salvage, LCSW  THERAPIST PROGRESS NOTE            Session Time: Friday 07/02/2020 11:10 AM - 11:46 AM   Participation Level: Active  Behavioral Response: CasualAlert/depressed/tearful  Type of Therapy: Individual Therapy  Treatment Goals addressed: Patient states wanting to stop being so depressed all the time, wanting to work on her attitude/ Alleviate symptoms of depression,resume normal interest in activities and socialization  Interventions: CBT and Supportive  Summary: Susan Ball is a 50 y.o. female who p is referred for services by Susan Ball. She just completed IOP on March 27, 2019. She has long standing history of bipolar disorder and borderline personality disorder. She has had 3 psychiatric hospitalizations. The last was in 2010 at [facility] due to suicide attempt. She participated in outpatient therapy intermittently at [facility] for 3 years. She last was seen there 6 months ago.  Current symptoms include depressed mood, crying spells, mood swings, aggression, anger, excessive worry, sleep difficulty, poor concentration, and memory difficulty.  Patient was seen via virtual visit about two weeks ago.  She denies any symptoms of depression but reports starting to feel more manic.  Per her report, she took Vraylar for about a week and a half but discontinued taking it 2 days ago due to side effects of sleep difficulty and becoming  overheated when working out at [employer].  She continues to attend the [facility] aftercare group and reports working with Susan Ball to inform psychiatrist Dr. Lolly Mustache regarding medication issues.  She agrees to follow-up with Ms. Morris on Tuesday.  Patient reports continued involvement in activity as well as continuing to work part-time.  She reports this is going well.  She reports increased stress and anxiety regarding issues with son.  Per patient's report, she l has loaned him her car while he is working to repair her other car.  However, this is creating hardship for patient as son often does not pick her up on time to take her places where she needs to go.  She has tried to express her concerns assertively to son.  She also expresses frustration as other members of her family after make stressful comments to her about this.  Patient reports she continues to smoke 1-2 blunts per day.    Suicidal/Homicidal: Patient denies any suicidal thoughts, any plan and any intent to harm self since last session.    Therapist Response:  reviewed symptoms, praised and reinforced patient's continued involvement in activity/participation in BH-IOP aftercare group, discussed medication concerns and developed plan with patient to follow-up with Susan Ball and Dr. Lolly Mustache next week regarding medication, also discussed the importance of monitoring moods, developed plan with patient to use bipolar mood log daily and bring forms to next session, discussed stressors, facilitated expression of thoughts and feelings, validated feelings, assisted patient identify ways to use assertiveness skills, set/maintain limits with family members regarding conversation about her son    Plan: Return again in 2  weeks.  Diagnosis: Axis I: Bipolar Disorder    Axis II: Borderline Personality Dis.    Susan Salvage, LCSW 07/02/2020

## 2020-07-06 ENCOUNTER — Ambulatory Visit (INDEPENDENT_AMBULATORY_CARE_PROVIDER_SITE_OTHER): Payer: Medicare Other | Admitting: Psychiatry

## 2020-07-06 ENCOUNTER — Encounter (HOSPITAL_COMMUNITY): Payer: Self-pay | Admitting: Psychiatry

## 2020-07-06 ENCOUNTER — Other Ambulatory Visit: Payer: Self-pay

## 2020-07-06 DIAGNOSIS — F319 Bipolar disorder, unspecified: Secondary | ICD-10-CM | POA: Diagnosis not present

## 2020-07-06 NOTE — Progress Notes (Signed)
Virtual Visit via Video Note  I connected with Susan Ball on 07/06/20 at 12:00 PM EDT by a video enabled telemedicine application and verified that I am speaking with the correct person using two identifiers.  I discussed the limitations of evaluation and management by telemedicine and the availability of in person appointments. The patient expressed understanding and agreed to proceed.    Location: Patient: Patient Home Provider: Home Office   GROUP GOAL: Client will attend IOP Aftercare Group Therapy 2-4x a month to connect with peers and to apply strategies discussed to improve mental health condition.  History of Present Illness: Bipolar DO  Observations/Objective: Counselor met with Patient in the context of Group Therapy, via Webex. Counselor prompted Patient to share updates on coping skill application and individual therapeutic process in management of mental health. Client reports intentional efforts to maintain mental health.  Counselor prompted Patient to share areas of concern, challenges and identify barriers to meeting current goals. Topics covered: declining ADLs, isolating, boundary violations, assertive communication, coping with grief reminders and trauma triggers, body image issues, sleep concerns, issues and with providers.  Patient engaged in discussion, provided feedback for others within the group and took note of additional strategies reviewed and discussed.   Assessment and Plan: Counselor recommends client continue following treatment plan goals, following crisis plan and following up with behavioral health and medical providers as needed. Patient is welcome to join group at next session.   Follow Up Instructions: Counselor to provide link for next session via Webex platform. The patient was advised to call back or seek an in-person evaluation if the symptoms worsen or if the condition fails to improve as anticipated.  I provided 85 minutes of  non-face-to-face time during this encounter.   Lise Auer, LCSW

## 2020-07-13 ENCOUNTER — Ambulatory Visit (INDEPENDENT_AMBULATORY_CARE_PROVIDER_SITE_OTHER): Payer: Medicare Other | Admitting: Psychiatry

## 2020-07-13 ENCOUNTER — Other Ambulatory Visit: Payer: Self-pay

## 2020-07-13 DIAGNOSIS — F319 Bipolar disorder, unspecified: Secondary | ICD-10-CM | POA: Diagnosis not present

## 2020-07-14 ENCOUNTER — Encounter (HOSPITAL_COMMUNITY): Payer: Self-pay | Admitting: Psychiatry

## 2020-07-14 NOTE — Progress Notes (Signed)
Virtual Visit via Video Note  I connected with Susan Ball on 07/14/20 at 12:00 PM EDT by a video enabled telemedicine application and verified that I am speaking with the correct person using two identifiers.  I discussed the limitations of evaluation and management by telemedicine and the availability of in person appointments. The patient expressed understanding and agreed to proceed.    Location: Patient: Patient Home Provider: Home Office   GROUP GOAL: Client will attend IOP Aftercare Group Therapy 2-4x a month to connect with peers and to apply strategies discussed to improve mental health condition.  History of Present Illness: Bipolar DO  Observations/Objective: Counselor met with Patient in the context of Group Therapy, via Webex. Counselor prompted Patient to share updates on coping skill application and individual therapeutic process in management of mental health. Client reports intentional efforts to maintain mental health.  Counselor prompted Patient to share areas of concern, challenges and identify barriers to meeting current goals. Topics covered: low energy and motivation, being intentional with time and schedule, father's day grief reminders, expanding support system, trying new activities in the community, medication side effects, Disability Process, health related issues, medical trauma, and self-care practices.  Patient engaged in discussion, provided feedback for others within the group and took note of additional strategies reviewed and discussed.   Assessment and Plan: Counselor recommends client continue following treatment plan goals, following crisis plan and following up with behavioral health and medical providers as needed. Patient is welcome to join group at next session.   Follow Up Instructions: Counselor to provide link for next session via Webex platform. The patient was advised to call back or seek an in-person evaluation if the symptoms worsen or if  the condition fails to improve as anticipated.  I provided 85 minutes of non-face-to-face time during this encounter.   Lise Auer, LCSW

## 2020-07-16 ENCOUNTER — Ambulatory Visit (INDEPENDENT_AMBULATORY_CARE_PROVIDER_SITE_OTHER): Payer: Medicare Other | Admitting: Psychiatry

## 2020-07-16 ENCOUNTER — Other Ambulatory Visit: Payer: Self-pay

## 2020-07-16 DIAGNOSIS — F319 Bipolar disorder, unspecified: Secondary | ICD-10-CM
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
## 2020-07-20 ENCOUNTER — Ambulatory Visit (INDEPENDENT_AMBULATORY_CARE_PROVIDER_SITE_OTHER): Payer: Medicare Other | Admitting: Psychiatry

## 2020-07-20 ENCOUNTER — Encounter (HOSPITAL_COMMUNITY): Payer: Self-pay | Admitting: Psychiatry

## 2020-07-20 ENCOUNTER — Other Ambulatory Visit: Payer: Self-pay

## 2020-07-20 DIAGNOSIS — F319 Bipolar disorder, unspecified: Secondary | ICD-10-CM | POA: Diagnosis not present

## 2020-07-20 NOTE — Progress Notes (Signed)
Virtual Visit via Video Note  I connected with Susan Ball on 07/20/20 at 12:00 PM EDT by a video enabled telemedicine application and verified that I am speaking with the correct person using two identifiers.  I discussed the limitations of evaluation and management by telemedicine and the availability of in person appointments. The patient expressed understanding and agreed to proceed.    Location: Patient: Patient Home Provider: Home Office   GROUP GOAL: Client will attend IOP Aftercare Group Therapy 2-4x a month to connect with peers and to apply strategies discussed to improve mental health condition.  History of Present Illness: Bipolar 1 DO  Observations/Objective: Counselor met with Patient in the context of Group Therapy, via Webex. Counselor prompted Patient to share updates on coping skill application and individual therapeutic process in management of mental health. Client reports intentional efforts to maintain mental health.  Counselor prompted Patient to share areas of concern, challenges and identify barriers to meeting current goals. Group topics covered: current events impact on MH, medical issues impacting mental health, substance abstinence maintenance, childhood traumas, increased socialization, intimate partner communication, medication concerns, and grief and loss.  Patient engaged in discussion, provided feedback for others within the group and took note of additional strategies reviewed and discussed.   Assessment and Plan: Counselor recommends client continue following treatment plan goals, following crisis plan and following up with behavioral health and medical providers as needed. Patient is welcome to join group at next session.   Follow Up Instructions: Counselor to provide link for next session via Webex platform. The patient was advised to call back or seek an in-person evaluation if the symptoms worsen or if the condition fails to improve as  anticipated.  I provided 90 minutes of non-face-to-face time during this encounter.   Lise Auer, LCSW

## 2020-07-23 DIAGNOSIS — N3020 Other chronic cystitis without hematuria: Secondary | ICD-10-CM | POA: Diagnosis not present

## 2020-07-27 ENCOUNTER — Encounter (HOSPITAL_COMMUNITY): Payer: Self-pay | Admitting: Psychiatry

## 2020-07-27 ENCOUNTER — Ambulatory Visit (INDEPENDENT_AMBULATORY_CARE_PROVIDER_SITE_OTHER): Payer: Medicare Other | Admitting: Psychiatry

## 2020-07-27 ENCOUNTER — Other Ambulatory Visit: Payer: Self-pay

## 2020-07-27 DIAGNOSIS — F319 Bipolar disorder, unspecified: Secondary | ICD-10-CM | POA: Diagnosis not present

## 2020-07-27 NOTE — Progress Notes (Signed)
Virtual Visit via Video Note  I connected with Susan Ball on 07/27/20 at 12:00 PM EDT by a video enabled telemedicine application and verified that I am speaking with the correct person using two identifiers.  I discussed the limitations of evaluation and management by telemedicine and the availability of in person appointments. The patient expressed understanding and agreed to proceed.    Location: Patient: Patient Home Provider: Home Office   GROUP GOAL: Client will attend IOP Aftercare Group Therapy 2-4x a month to connect with peers and to apply strategies discussed to improve mental health condition.  History of Present Illness: Bipolar DO  Observations/Objective: Counselor met with Patient in the context of Group Therapy, via Webex. Counselor prompted Patient to share updates on coping skill application and individual therapeutic process in management of mental health. Client reports intentional efforts to maintain mental health.  Counselor prompted Patient to share areas of concern, challenges and identify barriers to meeting current goals. Group topics covered: self-doubt, isolation, trauma triggers, grief responses, weight loss surgery and impact on MH, impact of current events, incorporating exercise, and discharge planning.  Patient engaged in discussion, provided feedback for others within the group and took note of additional strategies reviewed and discussed.   Assessment and Plan: Counselor recommends client continue following treatment plan goals, following crisis plan and following up with behavioral health and medical providers as needed. Patient is welcome to join group at next session.   Follow Up Instructions: Counselor to provide link for next session via Webex platform. The patient was advised to call back or seek an in-person evaluation if the symptoms worsen or if the condition fails to improve as anticipated.  I provided 90 minutes of non-face-to-face time  during this encounter.   Lise Auer, LCSW

## 2020-07-30 ENCOUNTER — Other Ambulatory Visit: Payer: Self-pay

## 2020-07-30 ENCOUNTER — Ambulatory Visit (HOSPITAL_COMMUNITY): Payer: Medicare Other | Admitting: Psychiatry

## 2020-08-10 ENCOUNTER — Emergency Department (HOSPITAL_COMMUNITY): Payer: Medicare Other

## 2020-08-10 ENCOUNTER — Encounter (HOSPITAL_COMMUNITY): Payer: Self-pay

## 2020-08-10 ENCOUNTER — Other Ambulatory Visit: Payer: Self-pay

## 2020-08-10 ENCOUNTER — Emergency Department (HOSPITAL_COMMUNITY)
Admission: EM | Admit: 2020-08-10 | Discharge: 2020-08-10 | Disposition: A | Discharge status: Home / Self Care | Payer: Medicare Other | Attending: Emergency Medicine | Admitting: Emergency Medicine

## 2020-08-10 DIAGNOSIS — Z23 Encounter for immunization: Secondary | ICD-10-CM | POA: Insufficient documentation

## 2020-08-10 DIAGNOSIS — S62632A Displaced fracture of distal phalanx of right middle finger, initial encounter for closed fracture: Secondary | ICD-10-CM | POA: Diagnosis not present

## 2020-08-10 DIAGNOSIS — S6710XA Crushing injury of unspecified finger(s), initial encounter: Secondary | ICD-10-CM

## 2020-08-10 DIAGNOSIS — S62639B Displaced fracture of distal phalanx of unspecified finger, initial encounter for open fracture: Secondary | ICD-10-CM

## 2020-08-10 DIAGNOSIS — S67192A Crushing injury of right middle finger, initial encounter: Secondary | ICD-10-CM | POA: Diagnosis not present

## 2020-08-10 DIAGNOSIS — S61212A Laceration without foreign body of right middle finger without damage to nail, initial encounter: Secondary | ICD-10-CM | POA: Diagnosis not present

## 2020-08-10 DIAGNOSIS — W230XXA Caught, crushed, jammed, or pinched between moving objects, initial encounter: Secondary | ICD-10-CM | POA: Diagnosis not present

## 2020-08-10 DIAGNOSIS — S6991XA Unspecified injury of right wrist, hand and finger(s), initial encounter: Secondary | ICD-10-CM | POA: Diagnosis present

## 2020-08-10 MED ORDER — TETANUS-DIPHTH-ACELL PERTUSSIS 5-2.5-18.5 LF-MCG/0.5 IM SUSY
0.5000 mL | PREFILLED_SYRINGE | Freq: Once | INTRAMUSCULAR | Status: AC
  Administered 2020-08-10: 0.5 mL via INTRAMUSCULAR
  Filled 2020-08-10: qty 0.5

## 2020-08-10 MED ORDER — LIDOCAINE HCL (PF) 2 % IJ SOLN
5.0000 mL | Freq: Once | INTRAMUSCULAR | Status: AC
  Administered 2020-08-10: 5 mL via INTRADERMAL

## 2020-08-10 MED ORDER — POVIDONE-IODINE 10 % EX SOLN
CUTANEOUS | Status: DC | PRN
  Administered 2020-08-10: 1 via TOPICAL
  Filled 2020-08-10: qty 15

## 2020-08-10 MED ORDER — CLINDAMYCIN HCL 150 MG PO CAPS
300.0000 mg | ORAL_CAPSULE | Freq: Once | ORAL | Status: AC
  Administered 2020-08-10: 300 mg via ORAL
  Filled 2020-08-10: qty 2

## 2020-08-10 MED ORDER — CLINDAMYCIN HCL 150 MG PO CAPS: 150.0000 mg | ORAL_CAPSULE | Freq: Four times a day (QID) | ORAL | 0 refills | Status: DC

## 2020-08-10 MED ORDER — LIDOCAINE HCL (PF) 2 % IJ SOLN
INTRAMUSCULAR | Status: AC
  Filled 2020-08-10: qty 20

## 2020-08-10 NOTE — ED Notes (Signed)
Tammy, PA at bedside to suture patient at this time.

## 2020-08-10 NOTE — Discharge Instructions (Addendum)
The x-ray today shows that you have a broken bone to the distal end of your finger.  Please keep the wound clean with mild soap and water and keep it bandaged.  You may wear the finger splint as needed for support and protection.  Call the orthopedic provider listed to arrange a follow-up appointment.  Elevate your hand when possible.  You may take over-the-counter ibuprofen if needed for pain.  Sutures out in 10 days.  Return to the emergency department for any signs of infection.

## 2020-08-10 NOTE — ED Provider Notes (Signed)
[facility] EMERGENCY DEPARTMENT Provider Note   CSN: [id number] Arrival date & time: 08/10/20  1014     History Chief Complaint  Patient presents with   Finger Injury    Susan Ball is a 50 y.o. female.  HPI      Susan Ball is a 50 y.o. female who presents to the Emergency Department complaining of laceration to her right middle finger secondary to a crush type injury.  States that she was lifting weights earlier and dropped a dumbbell on her finger.  She describes a throbbing pain to the distal end of her finger.  She is applied pressure to control bleeding.  She denies any pain, swelling, or numbness proximal to the tip of her finger.  She is unsure of last tetanus.  She denies any anticoagulant use.  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     Family History  Problem Relation Age of Onset   Depression Sister    Depression Brother     Social History   Tobacco Use   Smoking status: Never   Smokeless tobacco: Never  Vaping Use   Vaping Use: Never used  Substance  Use Topics   Alcohol use: Not Currently   Drug use: Not Currently    Types: Marijuana    Comment: States she stopped smoking THC in 2002    Home Medications Prior to Admission medications   Medication Sig Start Date End Date Taking? Authorizing Provider  calcium carbonate (OS-CAL - DOSED IN MG OF ELEMENTAL CALCIUM) 1250 (500 Ca) MG tablet Take 1 tablet by mouth daily with breakfast.     [provider]  cariprazine (VRAYLAR) capsule Take 1 capsule (1.5 mg total) by mouth daily. 05/19/20   [name], Phillips Grout, MD  Cholecalciferol 25 MCG (1000 UT) capsule Take 1,000 Units by mouth daily.     [provider]  cloNIDine (CATAPRES) 0.1 MG tablet Take 1 tablet (0.1 mg total) by mouth at bedtime. Take at hs 05/19/20   [name], Phillips Grout, MD  cyclobenzaprine (FLEXERIL) 5 MG tablet Take 5 mg by mouth 3 (three) times daily as needed. 05/14/20   Johny Blamer, MD  FLUoxetine (PROZAC) 20 MG capsule Take 1 capsule (20 mg total) by mouth daily. 05/19/20 05/19/21  [name], Phillips Grout, MD  FLUoxetine (PROZAC) 40 MG capsule Take 1 capsule (40 mg total) by mouth daily. 05/19/20   Cleotis Nipper, MD  lactulose (CHRONULAC) 10 GM/15ML solution 15-30 ml 02/02/20   Johny Blamer, MD  lithium carbonate (LITHOBID) 300 MG CR tablet Take one tab (300  mg total) twice a day. 05/19/20   [name], Phillips GroutSyed T, MD  naproxen (NAPROSYN) 500 MG tablet Take 500 mg by mouth 2 (two) times daily. 05/14/20   Johny BlamerHarris, William, MD    Allergies    Lamictal [lamotrigine]  Review of Systems   Review of Systems  Constitutional:  Negative for chills and fever.  Gastrointestinal:  Negative for nausea and vomiting.  Musculoskeletal:  Positive for arthralgias (Right middle finger pain and laceration). Negative for joint swelling.  Skin:  Positive for wound.  Neurological:  Negative for dizziness, weakness and numbness.  Hematological:  Does not bruise/bleed easily.   Physical Exam Updated Vital Signs BP (!) 141/74 (BP Location: Right Arm)    Pulse 74   Temp 98.4 F (36.9 C) (Oral)   Resp 18   Ht 5\' 5"  (1.651 m)   Wt 115.2 kg   SpO2 100%   BMI 42.27 kg/m   Physical Exam Constitutional:      General: She is not in acute distress.    Appearance: Normal appearance. She is not ill-appearing.  HENT:     Head: Atraumatic.  Cardiovascular:     Rate and Rhythm: Normal rate and regular rhythm.     Pulses: Normal pulses.  Pulmonary:     Effort: Pulmonary effort is normal.     Breath sounds: Normal breath sounds. No wheezing.  Musculoskeletal:        General: Tenderness and signs of injury present. Normal range of motion.     Right hand: Laceration and tenderness present. No swelling. Normal range of motion. Normal strength. Normal sensation. Normal capillary refill. Normal pulse.     Comments: 2 cm laceration to the distal tip of the right long finger.  Slight avulsion of the distal tip of the nail.  Bleeding controlled.  No subungual hematoma.  Mild tenderness at the DIP joint without laceration involvement.  Mild tenderness of the index and ring fingers as well.  No edema.  No bony tenderness of the right hand or wrist.  Skin:    General: Skin is warm.     Capillary Refill: Capillary refill takes less than 2 seconds.     Findings: No rash.  Neurological:     General: No focal deficit present.     Mental Status: She is alert.     Sensory: No sensory deficit.     Motor: No weakness.    ED Results / Procedures / Treatments   Labs (all labs ordered are listed, but only abnormal results are displayed) Labs Reviewed - No data to display  EKG None  Radiology DG Hand Complete Right  Result Date: 08/10/2020 CLINICAL DATA:  Pain and bleeding middle finger after blunt trauma EXAM: RIGHT HAND - COMPLETE 3+ VIEW COMPARISON:  08/07/2008 FINDINGS: Comminuted fracture of the tuft distal phalanx long finger. Less than 1 mm displacement. There is also fracture of a spur from the dorsal base of the distal phalanx, distracted less  than 1 mm. No other fracture seen. Otherwise normal mineralization and alignment. No significant osseous degenerative change. IMPRESSION: Right long finger distal phalanx fractures as above Electronically Signed   By: Corlis Leak  Hassell M.D.   On: 08/10/2020 11:23    Procedures .Marland Kitchen.Laceration Repair  Date/Time: 08/10/2020 1:31 PM Performed by: Pauline Ausriplett, Becci Batty, PA-C Authorized by: Pauline Ausriplett, Josha Weekley, PA-C   Consent:    Consent obtained:  Verbal   Consent given by:  Patient   Risks, benefits, and alternatives were discussed: yes  Risks discussed:  Infection, poor cosmetic result and poor wound healing Universal protocol:    Test results available: yes     Patient identity confirmed:  Verbally with patient and provided demographic data Anesthesia:    Anesthesia method:  Nerve block   Block needle gauge:  25 G   Block anesthetic:  Lidocaine 2% w/o epi   Block technique:  Digital block   Block injection procedure:  Introduced needle and negative aspiration for blood   Block outcome:  Anesthesia achieved Laceration details:    Location:  Finger   Finger location:  R long finger   Length (cm):  2 Pre-procedure details:    Preparation:  Patient was prepped and draped in usual sterile fashion and imaging obtained to evaluate for foreign bodies Exploration:    Limited defect created (wound extended): no     Hemostasis achieved with:  Direct pressure   Imaging obtained: x-ray     Imaging outcome: foreign body not noted     Wound exploration: wound explored through full range of motion     Wound extent: underlying fracture     Wound extent: no nerve damage noted and no tendon damage noted     Contaminated: no   Treatment:    Area cleansed with:  Povidone-iodine   Amount of cleaning:  Standard   Irrigation solution:  Sterile saline   Irrigation method:  Syringe   Visualized foreign bodies/material removed: no     Debridement:  None   Undermining:  None Skin repair:    Repair method:   Sutures   Suture size:  4-0   Suture material:  Prolene   Suture technique:  Simple interrupted   Number of sutures:  4 Approximation:    Approximation:  Close Repair type:    Repair type:  Simple Post-procedure details:    Dressing:  Non-adherent dressing and splint for protection   Procedure completion:  Tolerated well, no immediate complications   Medications Ordered in ED Medications  povidone-iodine (BETADINE) 10 % external solution (1 application Topical Given 08/10/20 1127)  clindamycin (CLEOCIN) capsule 300 mg (has no administration in time range)  lidocaine HCl (PF) (XYLOCAINE) 2 % injection 5 mL ( Intradermal Not Given 08/10/20 1127)  Tdap (BOOSTRIX) injection 0.5 mL (0.5 mLs Intramuscular Given 08/10/20 1127)    ED Course  I have reviewed the triage vital signs and the nursing notes.  Pertinent labs & imaging results that were available during my care of the patient were reviewed by me and considered in my medical decision making (see chart for details).    MDM Rules/Calculators/A&P                          Patient here with crush injury to distal right middle finger.  Sensation intact.  Hemostasis obtained after direct pressure to the wound.  Wound explored, no foreign bodies.  Minimal avulsion of the distal nail, but nail bed intact and secure after wound closure.  No subungual hematoma.  Wound irrigated and well approximated.  Wound dressing applied and finger splinted .  Td updated.  Pt agreeable to close follow-up with hand surgeon in [place].  Prescription provided for antibiotic.   Final Clinical Impression(s) / ED Diagnoses Final diagnoses:  Crushing injury of finger, initial encounter  Open fracture of tuft of distal phalanx of finger    Rx / DC Orders ED Discharge Orders  Ordered    clindamycin (CLEOCIN) 150 MG capsule  Every 6 hours        08/10/20 1335             Pauline Aus, PA-C 08/13/20 1311    Linwood Dibbles, MD 08/14/20  1121

## 2020-08-10 NOTE — ED Notes (Signed)
Patient right 3rd finger has 4 stitches and wrapped with telfa, finger splinted and wrapped with kling. Patient educated on wound care.

## 2020-08-10 NOTE — ED Triage Notes (Signed)
Pt presents to ED with laceration to right middle finger from dropping a weight on finger.

## 2020-08-13 ENCOUNTER — Other Ambulatory Visit: Payer: Self-pay

## 2020-08-13 ENCOUNTER — Ambulatory Visit (INDEPENDENT_AMBULATORY_CARE_PROVIDER_SITE_OTHER): Payer: Medicare Other | Admitting: Psychiatry

## 2020-08-13 DIAGNOSIS — F319 Bipolar disorder, unspecified: Secondary | ICD-10-CM

## 2020-08-13 NOTE — Progress Notes (Signed)
Virtual Visit via Video Note  I connected with Susan Ball on 08/13/20 at 11:03 AM EDT by a video enabled telemedicine application and verified that I am speaking with the correct person using two identifiers.  Location: Patient: Home Provider: [facility] [place] office    I discussed the limitations of evaluation and management by telemedicine and the availability of in person appointments. The patient expressed understanding and agreed to proceed.   I provided 46 minutes of non-face-to-face time during this encounter.   [name], LCSW  THERAPIST PROGRESS NOTE            Session Time: Friday 08/13/2020 11:05 AM - 11:51 AM   Participation Level: Active  Behavioral Response: CasualAlert/depressed, tearful Type of Therapy: Individual Therapy  Treatment Goals addressed: Patient states wanting to stop being so depressed all the time, wanting to work on her attitude/ Alleviate symptoms of depression,resume normal interest in activities and socialization  Interventions: CBT and Supportive  Summary: Susan Ball is a 50 y.o. female who p is referred for services by [name]. She just completed IOP on March 27, 2019. She has long standing history of bipolar disorder and borderline personality disorder. She has had 3 psychiatric hospitalizations. The last was in 2010 at [facility] due to suicide attempt. She participated in outpatient therapy intermittently at [facility] for 3 years. She last was seen there 6 months ago.  Current symptoms include depressed mood, crying spells, mood swings, aggression, anger, excessive worry, sleep difficulty, poor concentration, and memory difficulty.  Patient was seen via virtual visit about 4 weeks ago.  She reports increased symptoms of depression for the past 2 weeks.  Symptoms include depressed mood, crying spells, irritability, sleep difficulty, and decreased interest/pleasure in activities.  Per patient's report, she and her husband have  had communication issues for the past 2 months.  She reports she and her husband haven't spent much time together.  She reports trying to talk with her husband about her concerns and needs but admits doing this by being more aggressive rather than assertive.  She reports she has not been using the bipolar mood log since being depressed.  She reports thoughts of wishing she was dead earlier this week but denies any plan or intent to harm self.  Has crisis contact information and agrees to call 911 or have someone take her to the ED should suicidal urges occur.  She reports she has started trying to become more involved in activities for the past 2 days.  She reports additional stress related to injuring her finger this past week.  Patient reports she has increased marijuana use to 2 times per day to try to cope with stress.   Patient continues to attend BH-IOP aftercare group.    Suicidal/Homicidal: Patient denies any current suicidal thoughts, any plan and any intent to harm self.  She agrees to call 911 or have someone take her to the ED should suicidal urges occur.  Patient also has crisis contact information  Therapist Response:  reviewed symptoms, discussed stressors, facilitated expression of thoughts and feelings, validated feelings, praised and reinforced patient's efforts to increase behavioral activation, developed plan with patient to resume daily planning, also developed plan with patient to use DBT skills to cope with distressful feelings, assisted patient identify ways to improve assertive communication versus aggressive communication, discussed empathic skills, also agreed to include husband in next session    Plan: Return again in 2  weeks.  Diagnosis: Axis I: Bipolar  Disorder    Axis II: Borderline Personality Dis.    [name], LCSW 08/13/2020

## 2020-08-17 DIAGNOSIS — S62662A Nondisplaced fracture of distal phalanx of right middle finger, initial encounter for closed fracture: Secondary | ICD-10-CM | POA: Diagnosis not present

## 2020-08-17 DIAGNOSIS — S61312A Laceration without foreign body of right middle finger with damage to nail, initial encounter: Secondary | ICD-10-CM | POA: Diagnosis not present

## 2020-08-18 ENCOUNTER — Telehealth (HOSPITAL_COMMUNITY): Payer: Medicare Other | Admitting: Psychiatry

## 2020-08-21 ENCOUNTER — Other Ambulatory Visit (HOSPITAL_COMMUNITY): Payer: Self-pay | Admitting: Psychiatry

## 2020-08-21 DIAGNOSIS — F419 Anxiety disorder, unspecified: Secondary | ICD-10-CM

## 2020-08-24 ENCOUNTER — Ambulatory Visit (INDEPENDENT_AMBULATORY_CARE_PROVIDER_SITE_OTHER): Payer: Medicare Other | Admitting: Psychiatry

## 2020-08-24 ENCOUNTER — Other Ambulatory Visit: Payer: Self-pay

## 2020-08-24 ENCOUNTER — Encounter (HOSPITAL_COMMUNITY): Payer: Self-pay | Admitting: Psychiatry

## 2020-08-24 DIAGNOSIS — Z4802 Encounter for removal of sutures: Secondary | ICD-10-CM | POA: Diagnosis not present

## 2020-08-24 DIAGNOSIS — F319 Bipolar disorder, unspecified: Secondary | ICD-10-CM

## 2020-08-24 DIAGNOSIS — S61312A Laceration without foreign body of right middle finger with damage to nail, initial encounter: Secondary | ICD-10-CM | POA: Diagnosis not present

## 2020-08-24 DIAGNOSIS — S62662A Nondisplaced fracture of distal phalanx of right middle finger, initial encounter for closed fracture: Secondary | ICD-10-CM | POA: Diagnosis not present

## 2020-08-24 NOTE — Progress Notes (Signed)
Virtual Visit via Video Note  I connected with Susan Ball on 08/24/20 at 12:00 PM EDT by a video enabled telemedicine application and verified that I am speaking with the correct person using two identifiers.  I discussed the limitations of evaluation and management by telemedicine and the availability of in person appointments. The patient expressed understanding and agreed to proceed.    Location: Patient: Patient Home Provider: Home Office   GROUP GOAL: Client will attend IOP Aftercare Group Therapy 2-4x a month to connect with peers and to apply strategies discussed to improve mental health condition.  History of Present Illness: Bipolar DO  Observations/Objective: Counselor met with Patient in the context of Group Therapy, via Webex. Counselor prompted Patient to share updates on coping skill application and individual therapeutic process in management of mental health. Client reports intentional efforts to maintain mental health.  Counselor prompted Patient to share areas of concern, challenges and identify barriers to meeting current goals. Group topics covered: specific treatments for trauma, securing new providers, body image, self-concepts, OCD symptoms, negative cognitions, insomnia, ADLs, living with others who are not managing mental health.  Patient engaged in discussion, provided feedback for others within the group and took note of additional strategies reviewed and discussed.   Assessment and Plan: Counselor recommends client continue following treatment plan goals, following crisis plan and following up with behavioral health and medical providers as needed. Patient is welcome to join group at next session.   Follow Up Instructions: Counselor to provide link for next session via Webex platform. The patient was advised to call back or seek an in-person evaluation if the symptoms worsen or if the condition fails to improve as anticipated.  I provided 90 minutes of  non-face-to-face time during this encounter.   Lise Auer, LCSW

## 2020-08-27 ENCOUNTER — Telehealth (HOSPITAL_COMMUNITY): Payer: Self-pay | Admitting: *Deleted

## 2020-08-27 ENCOUNTER — Ambulatory Visit (HOSPITAL_COMMUNITY): Payer: Medicare Other | Admitting: Psychiatry

## 2020-08-27 ENCOUNTER — Other Ambulatory Visit (HOSPITAL_COMMUNITY): Payer: Self-pay | Admitting: Psychiatry

## 2020-08-27 ENCOUNTER — Other Ambulatory Visit: Payer: Self-pay

## 2020-08-27 MED ORDER — CLONIDINE HCL 0.2 MG PO TABS: 0.2000 mg | ORAL_TABLET | Freq: Every day | ORAL | 2 refills | Status: DC

## 2020-08-27 NOTE — Telephone Encounter (Signed)
Spoke to pt at length. She denies SI or plan. She is not sleeping well, so will increase clonidine to 0.2 mg at qhs. She is compliant with prozac and lithium., Agrees to go t ED if suicidal thoughts recur. Please call her to set up appt with Dr. Lolly Mustache asap

## 2020-08-27 NOTE — Telephone Encounter (Signed)
Pt of Dr. Sheela Stack with [facility] who has called stating that she's currently experiencing increased depression with crying spells, decreased sleep, and intermittent thoughts of self harm. Pt states that she has stopped taking the Vrylar 1.5 mg a few months ago due to "side effects". Pt states that if something doesn't change she's going to "end up going to the crisis center". Pt lives in [place]. So would not be able to go to [facility]. Pt will go to [facility] if needed. Please review and advise. Thank you.

## 2020-08-28 DIAGNOSIS — Z1231 Encounter for screening mammogram for malignant neoplasm of breast: Secondary | ICD-10-CM | POA: Diagnosis not present

## 2020-09-07 ENCOUNTER — Other Ambulatory Visit: Payer: Self-pay

## 2020-09-07 ENCOUNTER — Telehealth (INDEPENDENT_AMBULATORY_CARE_PROVIDER_SITE_OTHER): Payer: Medicare Other | Admitting: Psychiatry

## 2020-09-07 DIAGNOSIS — F319 Bipolar disorder, unspecified: Secondary | ICD-10-CM

## 2020-09-07 DIAGNOSIS — F6089 Other specific personality disorders: Secondary | ICD-10-CM

## 2020-09-07 DIAGNOSIS — F419 Anxiety disorder, unspecified: Secondary | ICD-10-CM | POA: Diagnosis not present

## 2020-09-07 MED ORDER — LITHIUM CARBONATE ER 300 MG PO TBCR: EXTENDED_RELEASE_TABLET | ORAL | 0 refills | Status: DC

## 2020-09-07 MED ORDER — OLANZAPINE 5 MG PO TABS: 5.0000 mg | ORAL_TABLET | Freq: Every day | ORAL | 0 refills | Status: DC

## 2020-09-07 MED ORDER — FLUOXETINE HCL 40 MG PO CAPS: 40.0000 mg | ORAL_CAPSULE | Freq: Every day | ORAL | 0 refills | Status: DC

## 2020-09-07 NOTE — Progress Notes (Signed)
Virtual Visit via Telephone Note  I connected with [name] on 09/07/20 at  3:40 PM EDT by telephone and verified that I am speaking with the correct person using two identifiers.  Location: Patient: Home Provider: Home Office   I discussed the limitations, risks, security and privacy concerns of performing an evaluation and management service by telephone and the availability of in person appointments. I also discussed with the patient that there may be a patient responsible charge related to this service. The patient expressed understanding and agreed to proceed.   History of Present Illness: Patient is evaluated by phone session.  Lately she noticed increase irritability, anger, road rage and passive suicidal thoughts but no plan or any intent.  She is in therapy with [name] and also attending aftercare group with [facility].  She is not taking Vraylar because she reported after few days started to have side effects and she was feel like mania.  She was getting irritable and decided to come off from it.  In the past she had tried REXULTI but caused EPS, weight gain and she stopped.  Patient is now taking clonidine 0.2 mg at bedtime which was adjusted by covering physician Dr. Tenny Craw.  She noticed sleep is improved but is still have residual mood swings, irritability.  She is not involved in any self abusive behavior.  Patient like to go back on BuSpar which she had tried in the past but felt that did not tried higher dose.  She denies any tremors, shakes or any EPS.  She is compliant with Prozac 60 mg daily, clonidine 0.2 mg at bedtime and lithium 300 mg twice a day.  Her last lithium level was 0.5 which was done few months ago.  She had tried  Past Psychiatric History: H/O Bipolar, anxiety and borderline traits. Taking meds since age 15.  H/O multiple inpatient and suicidal attempt.  H/O overdose, jump from the car and banging head.  Tried Lamictal (rash), Seroquel, Abilify, doxepin,  Trazadone and hydroxyzine. H/O n/c with meds, refusing to eat and needed PICC line. H/O anger, road rage, paranoia and trust issues. Had DBT with good response.   Psychiatric Specialty Exam: Physical Exam  Review of Systems  There were no vitals taken for this visit.There is no height or weight on file to calculate BMI.  General Appearance: NA  Eye Contact:  NA  Speech:  Slow  Volume:  Decreased  Mood:  Anxious, Depressed, Dysphoric, and Irritable  Affect:  NA  Thought Process:  Descriptions of Associations: Intact  Orientation:  Full (Time, Place, and Person)  Thought Content:  Rumination  Suicidal Thoughts:   passive and fleeting thoughts but plan or intent  Homicidal Thoughts:  No  Memory:  Immediate;   Fair Recent;   Good Remote;   Good  Judgement:  Fair  Insight:  Shallow  Psychomotor Activity:  NA  Concentration:  Concentration: Fair and Attention Span: Fair  Recall:  [employer] of Knowledge:  Fair  Language:  Good  Akathisia:  No  Handed:  Right  AIMS (if indicated):     Assets:  Communication Skills Desire for Improvement Housing Transportation  ADL's:  Intact  Cognition:  WNL  Sleep:   better      Assessment and Plan: Bipolar disorder type I.  Anxiety.  Cluster B traits.  Review her current medication and previous medication.  I recommend she should do Gene sigt testing as she had tried multiple medication in the  past with poor outcome.  We discussed safety concerns and patient admitted having irritability, passive and fleeting suicidal thoughts but no plan or any intent.  She has never tried olanzapine and we will try olanzapine 5 mg at bedtime in the meantime we will get the results from Gene testing.  For now continue clonidine 0.5 mg at bedtime, Prozac 60 mg daily and lithium 300 mg twice a day.  We will consider cutting down the lithium if olanzapine works very good.  She is no longer taking the Vraylar.  Encouraged to continue therapy with [name] and to  attend aftercare program.  Discuss safety concerns and anytime having active suicidal thoughts or homicidal thought that she need to call 911 or go to local emergency room.  Follow-up with 3 to 4 weeks.    Follow Up Instructions:    I discussed the assessment and treatment plan with the patient. The patient was provided an opportunity to ask questions and all were answered. The patient agreed with the plan and demonstrated an understanding of the instructions.   The patient was advised to call back or seek an in-person evaluation if the symptoms worsen or if the condition fails to improve as anticipated.  I provided 26 minutes of non-face-to-face time during this encounter.   Cleotis Nipper, MD

## 2020-09-10 ENCOUNTER — Ambulatory Visit (INDEPENDENT_AMBULATORY_CARE_PROVIDER_SITE_OTHER): Payer: Medicare Other | Admitting: Psychiatry

## 2020-09-10 ENCOUNTER — Other Ambulatory Visit: Payer: Self-pay

## 2020-09-10 DIAGNOSIS — F319 Bipolar disorder, unspecified: Secondary | ICD-10-CM

## 2020-09-10 NOTE — Progress Notes (Signed)
Virtual Visit via Video Note  I connected with Susan Ball on 09/10/20 at 11:12 AM EDT  by a video enabled telemedicine application and verified that I am speaking with the correct person using two identifiers.  Location: Patient:  Home Provider: [facility] [place] office    I discussed the limitations of evaluation and management by telemedicine and the availability of in person appointments. The patient expressed understanding and agreed to proceed.   I provided 50 minutes of non-face-to-face time during this encounter.   [name], LCSW   THERAPIST PROGRESS NOTE            Session Time: Friday 09/10/2020  11:12 AM - 12:02   Participation Level: Active  Behavioral Response: CasualAlert/depressed, tearful Type of Therapy: Individual Therapy  Treatment Goals addressed: Patient states wanting to stop being so depressed all the time, wanting to work on her attitude/ Alleviate symptoms of depression,resume normal interest in activities and socialization  Interventions: CBT and Supportive  Summary: Susan Ball is a 50 y.o. female who p is referred for services by [name]. She just completed IOP on March 27, 2019. She has long standing history of bipolar disorder and borderline personality disorder. She has had 3 psychiatric hospitalizations. The last was in 2010 at [facility] due to suicide attempt. She participated in outpatient therapy intermittently at [facility] for 3 years. She last was seen there 6 months ago.  Current symptoms include depressed mood, crying spells, mood swings, aggression, anger, excessive worry, sleep difficulty, poor concentration, and memory difficulty.  Patient was seen via virtual visit about 4 weeks ago.  She reports continued symptoms of depression reported on PHQ -9.  She reports last having fleeting suicidal ideations about 2 days ago but denies any plan or intent to harm self.  She continues to report multiple stressors including having  an injured finger, reduced work hours, conflict with her husband, and conflict with her mother.  She recently saw psychiatrist Dr. Lolly Mustache and reports there has been a change in her medication.  She reports sleeping better since taking olanzapine.  She reports she has not been using her mood logs or daily planning.  She reports difficulty in implementing use of DBT skills discussed in previous sessions.  She does continue to attend aftercare group and reports this is helpful.  She also is pleased having a good conversation with husband yesterday as she was assertive and expressing her needs and concerns.  She also is pleased she listened to her husband.  She suspects he has some behavioral health issues and is in the process of trying to get him help.     Suicidal/Homicidal: Patient denies any current suicidal thoughts, any plan and any intent to harm self.  She agrees to call 911 or have someone take her to the ED should suicidal urges occur.  Patient also has crisis contact information  Therapist Response:  reviewed symptoms, discussed stressors, facilitated expression of thoughts and feelings, validated feelings, praised and reinforced patient's efforts to increase behavioral activation, praised and reinforced patient's efforts to improve assertive communication versus aggressive communication with husband, discussed effects,assisted patient identify and address thoughts and processes that inhibited use of daily planning and/mood log/DBT skills, developed plan with patient to resume daily planning and bipolar mood log, also developed plan with patient to review DBT skills,     Plan: Return again in 2  weeks.  Diagnosis: Axis I: Bipolar Disorder    Axis II: Borderline Personality Dis.  [name], LCSW 09/10/2020

## 2020-09-13 ENCOUNTER — Telehealth (HOSPITAL_COMMUNITY): Payer: Self-pay | Admitting: *Deleted

## 2020-09-13 NOTE — Telephone Encounter (Signed)
Writer spoke with pt to inform that this nurse had GeneSight kit shipped to her residence. Instructions reviewed with pt and she reassured that the instructions are very clear and there is an 800 number to contact costumer support if needed. Pt may also call nurse if she is experiencing any difficulty. Pt verbalizes understanding.

## 2020-09-14 ENCOUNTER — Ambulatory Visit (INDEPENDENT_AMBULATORY_CARE_PROVIDER_SITE_OTHER): Payer: Medicare Other | Admitting: Psychiatry

## 2020-09-14 ENCOUNTER — Telehealth (HOSPITAL_COMMUNITY): Payer: Medicare Other | Admitting: Psychiatry

## 2020-09-14 ENCOUNTER — Other Ambulatory Visit: Payer: Self-pay

## 2020-09-14 DIAGNOSIS — S67192D Crushing injury of right middle finger, subsequent encounter: Secondary | ICD-10-CM | POA: Diagnosis not present

## 2020-09-14 DIAGNOSIS — F419 Anxiety disorder, unspecified: Secondary | ICD-10-CM

## 2020-09-14 DIAGNOSIS — F319 Bipolar disorder, unspecified: Secondary | ICD-10-CM | POA: Diagnosis not present

## 2020-09-14 DIAGNOSIS — S61312A Laceration without foreign body of right middle finger with damage to nail, initial encounter: Secondary | ICD-10-CM | POA: Diagnosis not present

## 2020-09-14 DIAGNOSIS — S62662D Nondisplaced fracture of distal phalanx of right middle finger, subsequent encounter for fracture with routine healing: Secondary | ICD-10-CM | POA: Diagnosis not present

## 2020-09-14 NOTE — Telephone Encounter (Signed)
Thanks

## 2020-09-15 ENCOUNTER — Encounter (HOSPITAL_COMMUNITY): Payer: Self-pay | Admitting: Psychiatry

## 2020-09-15 NOTE — Addendum Note (Signed)
Addended by: Tymia Streb on: 09/15/2020 01:24 PM   Modules accepted: Level of Service  

## 2020-09-15 NOTE — Progress Notes (Signed)
Virtual Visit via Video Note  I connected with Susan Ball on 09/14/20 at 12:00 PM EDT by a video enabled telemedicine application and verified that I am speaking with the correct person using two identifiers.  I discussed the limitations of evaluation and management by telemedicine and the availability of in person appointments. The patient expressed understanding and agreed to proceed.    Location: Patient: Patient Home Provider: Home Office   GROUP GOAL: Client will attend IOP Aftercare Group Therapy 2-4x a month to connect with peers and to apply strategies discussed to improve mental health condition.  History of Present Illness: Bipolar DO and Anxiety  Observations/Objective: Counselor met with Patient in the context of Group Therapy, via Webex. Counselor prompted Patient to share updates on coping skill application and individual therapeutic process in management of mental health. Client reports intentional efforts to maintain mental health.  Counselor prompted Patient to share areas of concern, challenges and identify barriers to meeting current goals. Group topics covered: local resources, anger, grief and loss, progress in functioning, utilizing natural supports, communication with providers, medication management and concerns, and improving PTSD symptoms.  Patient engaged in discussion, provided feedback for others within the group and took note of additional strategies reviewed and discussed.   Assessment and Plan: Counselor recommends client continue following treatment plan goals, following crisis plan and following up with behavioral health and medical providers as needed. Patient is welcome to join group at next session.   Follow Up Instructions: Counselor to provide link for next session via Webex platform. The patient was advised to call back or seek an in-person evaluation if the symptoms worsen or if the condition fails to improve as anticipated.  I provided 90  minutes of non-face-to-face time during this encounter.   Lise Auer, LCSW

## 2020-09-21 ENCOUNTER — Other Ambulatory Visit: Payer: Self-pay

## 2020-09-21 ENCOUNTER — Ambulatory Visit (INDEPENDENT_AMBULATORY_CARE_PROVIDER_SITE_OTHER): Payer: Medicare Other | Admitting: Psychiatry

## 2020-09-21 ENCOUNTER — Encounter (HOSPITAL_COMMUNITY): Payer: Self-pay | Admitting: Psychiatry

## 2020-09-21 DIAGNOSIS — F319 Bipolar disorder, unspecified: Secondary | ICD-10-CM

## 2020-09-21 NOTE — Progress Notes (Signed)
Virtual Visit via Video Note  I connected with Susan Ball on 09/21/20 at  1:00 PM EDT by a video enabled telemedicine application and verified that I am speaking with the correct person using two identifiers.  I discussed the limitations of evaluation and management by telemedicine and the availability of in person appointments. The patient expressed understanding and agreed to proceed.    Location: Patient: Patient Home Provider: Home Office   GROUP GOAL: Client will attend IOP Aftercare Group Therapy 2-4x a month to connect with peers and to apply strategies discussed to improve mental health condition.  History of Present Illness: Bipolar DO  Observations/Objective: Counselor met with Patient in the context of Group Therapy, via Webex. Counselor prompted Patient to share updates on coping skill application and individual therapeutic process in management of mental health. Client reports intentional efforts to maintain mental health.  Counselor prompted Patient to share areas of concern, challenges and identify barriers to meeting current goals. Group topics covered: specialized treatment process and outcomes, continuity of care, community resources, positive affirmations, grief work, boundaries, regaining self-confidence, combating depression and anxiety with coping strategies.  Patient engaged in discussion, provided feedback for others within the group and took note of additional strategies reviewed and discussed.   Assessment and Plan: Counselor recommends client continue following treatment plan goals, following crisis plan and following up with behavioral health and medical providers as needed. Patient is welcome to join group at next session.   Follow Up Instructions: Counselor to provide link for next session via Webex platform. The patient was advised to call back or seek an in-person evaluation if the symptoms worsen or if the condition fails to improve as anticipated.  I  provided 60 minutes of non-face-to-face time during this encounter.   Lise Auer, LCSW

## 2020-09-24 ENCOUNTER — Other Ambulatory Visit: Payer: Self-pay

## 2020-09-24 ENCOUNTER — Ambulatory Visit (INDEPENDENT_AMBULATORY_CARE_PROVIDER_SITE_OTHER): Payer: Medicare Other | Admitting: Psychiatry

## 2020-09-24 ENCOUNTER — Encounter (HOSPITAL_COMMUNITY): Payer: Self-pay | Admitting: Psychiatry

## 2020-09-24 DIAGNOSIS — F319 Bipolar disorder, unspecified: Secondary | ICD-10-CM | POA: Diagnosis not present
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
## 2020-09-28 ENCOUNTER — Other Ambulatory Visit: Payer: Self-pay

## 2020-09-28 ENCOUNTER — Ambulatory Visit (INDEPENDENT_AMBULATORY_CARE_PROVIDER_SITE_OTHER): Payer: Medicare Other | Admitting: Psychiatry

## 2020-09-28 DIAGNOSIS — F319 Bipolar disorder, unspecified: Secondary | ICD-10-CM

## 2020-09-30 ENCOUNTER — Encounter (HOSPITAL_COMMUNITY): Payer: Self-pay | Admitting: Psychiatry

## 2020-09-30 NOTE — Progress Notes (Signed)
Virtual Visit via Video Note  I connected with Susan Ball on 09/28/20 at  1:00 PM EDT by a video enabled telemedicine application and verified that I am speaking with the correct person using two identifiers.  I discussed the limitations of evaluation and management by telemedicine and the availability of in person appointments. The patient expressed understanding and agreed to proceed.    Location: Patient: Patient Home Provider: Home Office   GROUP GOAL: Client will attend IOP Aftercare Group Therapy 2-4x a month to connect with peers and to apply strategies discussed to improve mental health condition.  History of Present Illness: Bipolar 1 DO  Observations/Objective: Counselor met with Patient in the context of Group Therapy, via Webex. Counselor prompted Patient to share updates on coping skill application and individual therapeutic process in management of mental health. Client reports intentional efforts to maintain mental health.  Counselor prompted Patient to share areas of concern, challenges and identify barriers to meeting current goals. Group topics covered: passive suicidal thoughts, finding life purpose and meaning, grief and loss, medication side effects, assertive communication, avoiding escaping behaviors, genesite testing and self-compassion.  Patient engaged in discussion, provided feedback for others within the group and took note of additional strategies reviewed and discussed.   Assessment and Plan: Counselor recommends client continue following treatment plan goals, following crisis plan and following up with behavioral health and medical providers as needed. Patient is welcome to join group at next session.   Follow Up Instructions: Counselor to provide link for next session via Webex platform. The patient was advised to call back or seek an in-person evaluation if the symptoms worsen or if the condition fails to improve as anticipated.  I provided 60 minutes  of non-face-to-face time during this encounter.   Lise Auer, LCSW

## 2020-09-30 NOTE — Addendum Note (Signed)
Addended by: Hilbert Odor on: 09/30/2020 03:54 PM   Modules accepted: Level of Service

## 2020-10-05 ENCOUNTER — Other Ambulatory Visit: Payer: Self-pay

## 2020-10-05 ENCOUNTER — Ambulatory Visit (INDEPENDENT_AMBULATORY_CARE_PROVIDER_SITE_OTHER): Payer: Medicare Other | Admitting: Psychiatry

## 2020-10-05 DIAGNOSIS — F319 Bipolar disorder, unspecified: Secondary | ICD-10-CM

## 2020-10-07 ENCOUNTER — Encounter (HOSPITAL_COMMUNITY): Payer: Self-pay | Admitting: Psychiatry

## 2020-10-07 ENCOUNTER — Other Ambulatory Visit: Payer: Self-pay

## 2020-10-07 ENCOUNTER — Telehealth (INDEPENDENT_AMBULATORY_CARE_PROVIDER_SITE_OTHER): Payer: Medicare Other | Admitting: Psychiatry

## 2020-10-07 DIAGNOSIS — F419 Anxiety disorder, unspecified: Secondary | ICD-10-CM

## 2020-10-07 DIAGNOSIS — F6089 Other specific personality disorders: Secondary | ICD-10-CM | POA: Diagnosis not present

## 2020-10-07 DIAGNOSIS — F319 Bipolar disorder, unspecified: Secondary | ICD-10-CM

## 2020-10-07 MED ORDER — OLANZAPINE 7.5 MG PO TABS: 7.5000 mg | ORAL_TABLET | Freq: Every day | ORAL | 0 refills | Status: DC

## 2020-10-07 NOTE — Progress Notes (Signed)
Virtual Visit via Video Note  I connected with Susan Ball on 10/07/20 at 11:00 AM EDT by a video enabled telemedicine application and verified that I am speaking with the correct person using two identifiers.  Location: Patient: Work Provider: Office   I discussed the limitations of evaluation and management by telemedicine and the availability of in person appointments. The patient expressed understanding and agreed to proceed.  History of Present Illness: Patient is evaluated by video session.  She is at work.  We started her on olanzapine 5 mg and she noticed much improvement in her mood sleep and irritability.  She is also attending group with [facility] and individual therapy with [name]  So far she has no issues or concerns from the medication.  She is trying to watch her calorie intake and she had lost 4 pounds since the last visit.  She like to go up on the medication as noticed medicine wearing off and she feels more anxious, irritable and frustrated.  She has no tremors or shakes or any EPS.  She has not involved in any self abusive behavior.  We are still waiting for genetic testing results which is pending.  Patient like to keep the Prozac, clonidine, lithium at present dose.   Past Psychiatric History: H/O Bipolar, anxiety and borderline traits. Taking meds since age 1.  H/O multiple inpatient and suicidal attempt.  H/O overdose, jump from the car and banging head.  Tried Lamictal (rash), Seroquel, Abilify, doxepin, Trazodone, Vraylar and hydroxyzine. H/O n/c with meds, refusing to eat and needed PICC line. H/O anger, road rage, paranoia and trust issues. Had DBT with good response.   Psychiatric Specialty Exam: Physical Exam  Review of Systems  Weight 250 lb (113.4 kg).There is no height or weight on file to calculate BMI.  General Appearance: NA  Eye Contact:  Fair  Speech:  Slow  Volume:  Normal  Mood:  Anxious  Affect:  Congruent  Thought Process:  Goal Directed   Orientation:  Full (Time, Place, and Person)  Thought Content:  WDL  Suicidal Thoughts:  No  Homicidal Thoughts:  No  Memory:  Immediate;   Good Recent;   Good Remote;   Fair  Judgement:  Intact  Insight:  Present  Psychomotor Activity:  Normal  Concentration:  Concentration: Fair and Attention Span: Fair  Recall:  Good  Fund of Knowledge:  Good  Language:  Good  Akathisia:  No  Handed:  Right  AIMS (if indicated):     Assets:  Communication Skills Desire for Improvement Housing Transportation  ADL's:  Intact  Cognition:  WNL  Sleep:   better      Assessment and Plan: Bipolar disorder type I.  Anxiety.  Cluster B traits.  Patient shown improvement with the olanzapine.  We will increase olanzapine 7.5 mg at bedtime and patient like to keep the current dose of Prozac 60 mg daily, lithium 300 mg twice a day, Klonopin 0.5 mg at bedtime.  Patient at this time does not want to lower the doses but open to try cutting down the medication in the future.  I encouraged to continue therapy with [name] and aftercare group with [facility].  Recommended to call us back if she has any question or any concern.  Follow-up in 4 weeks.  Follow Up Instructions:    I discussed the assessment and treatment plan with the patient. The patient was provided an opportunity to ask questions and all were answered. The  patient agreed with the plan and demonstrated an understanding of the instructions.   The patient was advised to call back or seek an in-person evaluation if the symptoms worsen or if the condition fails to improve as anticipated.  I provided 15 minutes of non-face-to-face time during this encounter.   Kathlee Nations, MD

## 2020-10-07 NOTE — Progress Notes (Signed)
Virtual Visit via Video Note  I connected with Susan Ball on 10/05/20 at  1:00 PM EDT by a video enabled telemedicine application and verified that I am speaking with the correct person using two identifiers.  I discussed the limitations of evaluation and management by telemedicine and the availability of in person appointments. The patient expressed understanding and agreed to proceed.    Location: Patient: Patient Home Provider: Home Office   GROUP GOAL: Client will attend IOP Aftercare Group Therapy 2-4x a month to connect with peers and to apply strategies discussed to improve mental health condition.  History of Present Illness: Bipolar DO  Observations/Objective: Counselor met with Patient in the context of Group Therapy, via Webex. Counselor prompted Patient to share updates on coping skill application and individual therapeutic process in management of mental health. Client reports intentional efforts to maintain mental health.  Counselor prompted Patient to share areas of concern, challenges and identify barriers to meeting current goals. Group topics covered: exploring new supports and interests, navigating relationships, communication and boundary setting.  Patient engaged in discussion, provided feedback for others within the group and took note of additional strategies reviewed and discussed.   Counselor shared that today would be one of our last group sessions, as Counselor will be ending time with [facility] effective October 1. Client was understanding and we discussed an appropriate transfer of care. Client to remain established with practice for counseling and psychiatry. Client aware of how to follow up and follow safety plan if needed.   Assessment and Plan: Counselor will meet with patient once more to provider group treatment before discharging or transferring care. Patient will continue to follow recommendations of providers and implement skills learned in session.    Follow Up Instructions: Counselor to send out list of potential providers for Clients to receive group treatment via mail.   The patient was advised to call back or seek an in-person evaluation if the symptoms worsen or if the condition fails to improve as anticipated.  I provided 60 minutes of non-face-to-face time during this encounter.   Lise Auer, LCSW

## 2020-10-08 ENCOUNTER — Other Ambulatory Visit: Payer: Self-pay

## 2020-10-08 ENCOUNTER — Ambulatory Visit (INDEPENDENT_AMBULATORY_CARE_PROVIDER_SITE_OTHER): Payer: Medicare Other | Admitting: Psychiatry

## 2020-10-08 DIAGNOSIS — F319 Bipolar disorder, unspecified: Secondary | ICD-10-CM | POA: Diagnosis not present

## 2020-10-08 NOTE — Progress Notes (Signed)
Virtual Visit via Video Note  I connected with Susan Ball on 10/08/20 at 11:07 AM EDT  by a video enabled telemedicine application and verified that I am speaking with the correct person using two identifiers.  Location: Patient: Home Provider: [facility] [place] office    I discussed the limitations of evaluation and management by telemedicine and the availability of in person appointments. The patient expressed understanding and agreed to proceed.  I provided 46 minutes of non-face-to-face time during this encounter.   [name], LCSW  THERAPIST PROGRESS NOTE            Session Time: Friday 10/08/2020  11:07 AM  -11:53 AM   Participation Level: Active  Behavioral Response: CasualAlert/euthymic Type of Therapy: Individual Therapy  Treatment Goals addressed: Patient states wanting to stop being so depressed all the time, wanting to work on her attitude/ Alleviate symptoms of depression,resume normal interest in activities and socialization  Interventions: CBT and Supportive  Summary: Susan Ball is a 50 y.o. female who p is referred for services by [name]. She just completed IOP on March 27, 2019. She has long standing history of bipolar disorder and borderline personality disorder. She has had 3 psychiatric hospitalizations. The last was in 2010 at [facility] due to suicide attempt. She participated in outpatient therapy intermittently at [facility] for 3 years. She last was seen there 6 months ago.  Current symptoms include depressed mood, crying spells, mood swings, aggression, anger, excessive worry, sleep difficulty, poor concentration, and memory difficulty.  Patient was seen via virtual visit about 4 weeks ago.  She reports much improved mood, decreased racing thoughts, decreased impulsivity, and improved sleep patterns since last session.  Patient attributes much of this to medication and states it is very helpful.  She also reports reduced marijuana use says  she has only used about 2 days/week.  She reports using the bipolar mood log and states having a couple of really good days.  She reports she has experienced 2 anger episodes since last session.  She reports initially reacting aggressively but then recognizing her behavior and responding in a more appropriate way.  She reports decreased stress regarding marriage as husband now is working and is much less irritable per patient's report.  Patient continues to work and maintains involvement in activity.  She expresses some anxiety about attending a surprise birthday party for her mother tomorrow due to concerns about possible negative interaction with other family members.    Suicidal/Homicidal: No, without plan, without intent  Therapist Response:  reviewed symptoms, praised and reinforced patient's continued medication compliance/increased awareness of thoughts and behavior, discussed effects, praised and reinforced patient's efforts to respond regarding recent anger episodes, discussed effects, assisted patient identify early warning signs of anger and anxiety/stress, reviewed ways to intervene using grounding techniques including deep breathing, also discussed counting backwards from 100 x 3, discussed using wise mind, facilitated patient expressing thoughts and feelings regarding attending mother's surprise birthday party, validated feelings, assisted patient identify ways to respond and to set/maintain limits including walking away, politely disengaging in conversation before escalating, also discussed how to use her support system including talking to her husband who plans to attend the event with patient     Plan: Return again in 2  weeks.  Diagnosis: Axis I: Bipolar Disorder    Axis II: Borderline Personality Dis.    [name], LCSW 10/08/2020

## 2020-10-12 DIAGNOSIS — S67192D Crushing injury of right middle finger, subsequent encounter: Secondary | ICD-10-CM | POA: Diagnosis not present

## 2020-10-12 DIAGNOSIS — S62662D Nondisplaced fracture of distal phalanx of right middle finger, subsequent encounter for fracture with routine healing: Secondary | ICD-10-CM | POA: Diagnosis not present

## 2020-10-12 DIAGNOSIS — S61312A Laceration without foreign body of right middle finger with damage to nail, initial encounter: Secondary | ICD-10-CM | POA: Diagnosis not present

## 2020-10-13 ENCOUNTER — Other Ambulatory Visit (HOSPITAL_COMMUNITY): Payer: Self-pay | Admitting: Psychiatry

## 2020-10-13 DIAGNOSIS — F319 Bipolar disorder, unspecified: Secondary | ICD-10-CM

## 2020-10-22 ENCOUNTER — Ambulatory Visit (INDEPENDENT_AMBULATORY_CARE_PROVIDER_SITE_OTHER): Payer: Medicare Other | Admitting: Psychiatry

## 2020-10-22 ENCOUNTER — Other Ambulatory Visit: Payer: Self-pay

## 2020-10-22 DIAGNOSIS — F319 Bipolar disorder, unspecified: Secondary | ICD-10-CM

## 2020-10-22 NOTE — Progress Notes (Signed)
Virtual Visit via Video Note  I connected with Susan Ball on 10/22/20 at 11:10 AM EDT  by a video enabled telemedicine application and verified that I am speaking with the correct person using two identifiers.  Location: Patient:Home Provider: [facility] [place] office    I discussed the limitations of evaluation and management by telemedicine and the availability of in person appointments. The patient expressed understanding and agreed to proceed.   I provided 40  minutes of non-face-to-face time during this encounter.   [name], LCSW  THERAPIST PROGRESS NOTE            Session Time: Friday 10/22/2020  11:10 AM - 11:50 AM   Participation Level: Active  Behavioral Response: CasualAlert/euthymic Type of Therapy: Individual Therapy  Treatment Goals addressed: Patient states wanting to stop being so depressed all the time, wanting to work on her attitude/ Alleviate symptoms of depression,resume normal interest in activities and socialization  Interventions: CBT and Supportive  Summary: Susan Ball is a 50 y.o. female who p is referred for services by [name]. She just completed IOP on March 27, 2019. She has long standing history of bipolar disorder and borderline personality disorder. She has had 3 psychiatric hospitalizations. The last was in 2010 at [facility] due to suicide attempt. She participated in outpatient therapy intermittently at [facility] for 3 years. She last was seen there 6 months ago.  Current symptoms include depressed mood, crying spells, mood swings, aggression, anger, excessive worry, sleep difficulty, poor concentration, and memory difficulty.  Patient was seen via virtual visit about two weeks ago.  She reports continued improved mood, decreased racing thoughts, decreased impulsivity, and improved sleep patterns since last session.  Patient  reports continued medication compliance and decreased marijuana use.  She continues to consistently use  bipolar mood log and reports mood has been slightly elevated for the past week but managing well.  She reports managing her mother's surprise birthday party very well.  She used deep breathing, self talk, and support from her husband as coping tools.  She is very pleased with the way she coped and reports enjoying the party.  She is very pleased she was able to stay for the entire event and experienced no negative interactions.  She maintains involvement in activity including going to work and going to [employer] to work out.  She reports continued improvement in the relationship with her husband and reports trying to talk more.  She is particularly concerned today as she is experiencing shortness of breath and swelling in her feet.  She expresses frustration regarding recent conversation with provider who did not seem to listen to her per patient's report.  Patient currently is not attending aftercare group as group leader recently left and new liter has not been designated.  Patient is making plans on how to fill that scheduled time to help self remain involved in activity and have structure.     Suicidal/Homicidal: No, without plan, without intent  Therapist Response:  reviewed symptoms, praised and reinforced patient's continued medication compliance/increased awareness of thoughts and behavior, praised and reinforced patient's use of helpful coping strategies in managing attending her mother's surprise birthday party, discussed effects, praised and reinforced patient's efforts to try to maintain structure and routine, discussed possible activities patient could pursue doing the scheduled time for the aftercare group in the interim, assisted patient identify ways to use assertiveness skills to advocate for herself regarding health issues, discussed next steps for treatment to  focus on interpersonal skills, developed plan with patient to write list of expectations of self regarding interpersonal skills in  preparation for next session, developed plan with patient to continue using bipolar mood log    Plan: Return again in 2  weeks.  Diagnosis: Axis I: Bipolar Disorder    Axis II: Borderline Personality Dis.    [name], LCSW 10/22/2020

## 2020-10-25 DIAGNOSIS — R609 Edema, unspecified: Secondary | ICD-10-CM | POA: Diagnosis not present

## 2020-10-25 DIAGNOSIS — R0609 Other forms of dyspnea: Secondary | ICD-10-CM | POA: Diagnosis not present

## 2020-10-25 DIAGNOSIS — D508 Other iron deficiency anemias: Secondary | ICD-10-CM | POA: Diagnosis not present

## 2020-10-26 ENCOUNTER — Other Ambulatory Visit: Payer: Self-pay | Admitting: Family Medicine

## 2020-10-26 DIAGNOSIS — R0609 Other forms of dyspnea: Secondary | ICD-10-CM

## 2020-10-26 DIAGNOSIS — R7989 Other specified abnormal findings of blood chemistry: Secondary | ICD-10-CM

## 2020-10-27 ENCOUNTER — Other Ambulatory Visit: Payer: Self-pay

## 2020-10-27 ENCOUNTER — Ambulatory Visit
Admission: RE | Admit: 2020-10-27 | Discharge: 2020-10-27 | Disposition: A | Discharge status: Home / Self Care | Payer: Medicare Other | Source: Ambulatory Visit | Attending: Family Medicine | Admitting: Family Medicine

## 2020-10-27 DIAGNOSIS — R918 Other nonspecific abnormal finding of lung field: Secondary | ICD-10-CM | POA: Diagnosis not present

## 2020-10-27 DIAGNOSIS — R0609 Other forms of dyspnea: Secondary | ICD-10-CM | POA: Diagnosis not present

## 2020-10-27 DIAGNOSIS — K769 Liver disease, unspecified: Secondary | ICD-10-CM | POA: Diagnosis not present

## 2020-10-27 DIAGNOSIS — R7989 Other specified abnormal findings of blood chemistry: Secondary | ICD-10-CM

## 2020-10-27 DIAGNOSIS — M5124 Other intervertebral disc displacement, thoracic region: Secondary | ICD-10-CM | POA: Diagnosis not present

## 2020-10-27 MED ORDER — IOPAMIDOL (ISOVUE-370) INJECTION 76%
75.0000 mL | Freq: Once | INTRAVENOUS | Status: AC | PRN
  Administered 2020-10-27: 75 mL via INTRAVENOUS

## 2020-11-02 ENCOUNTER — Other Ambulatory Visit (HOSPITAL_COMMUNITY): Payer: Self-pay | Admitting: Psychiatry

## 2020-11-02 DIAGNOSIS — D508 Other iron deficiency anemias: Secondary | ICD-10-CM | POA: Diagnosis not present

## 2020-11-02 DIAGNOSIS — F319 Bipolar disorder, unspecified: Secondary | ICD-10-CM

## 2020-11-03 DIAGNOSIS — M170 Bilateral primary osteoarthritis of knee: Secondary | ICD-10-CM | POA: Diagnosis not present

## 2020-11-03 DIAGNOSIS — R0602 Shortness of breath: Secondary | ICD-10-CM | POA: Diagnosis not present

## 2020-11-03 DIAGNOSIS — M25511 Pain in right shoulder: Secondary | ICD-10-CM | POA: Diagnosis not present

## 2020-11-03 DIAGNOSIS — Z23 Encounter for immunization: Secondary | ICD-10-CM | POA: Diagnosis not present

## 2020-11-05 ENCOUNTER — Ambulatory Visit (INDEPENDENT_AMBULATORY_CARE_PROVIDER_SITE_OTHER): Payer: Medicare Other | Admitting: Psychiatry

## 2020-11-05 ENCOUNTER — Other Ambulatory Visit: Payer: Self-pay

## 2020-11-05 DIAGNOSIS — F319 Bipolar disorder, unspecified: Secondary | ICD-10-CM | POA: Diagnosis not present

## 2020-11-05 DIAGNOSIS — K922 Gastrointestinal hemorrhage, unspecified: Secondary | ICD-10-CM | POA: Diagnosis not present

## 2020-11-05 DIAGNOSIS — R1013 Epigastric pain: Secondary | ICD-10-CM | POA: Diagnosis not present

## 2020-11-05 DIAGNOSIS — D509 Iron deficiency anemia, unspecified: Secondary | ICD-10-CM | POA: Diagnosis not present

## 2020-11-05 NOTE — Progress Notes (Signed)
Virtual Visit via Video Note  I connected with Susan Ball on 11/05/20 at 9:12 AM EDT  by a video enabled telemedicine application and verified that I am speaking with the correct person using two identifiers.  Location: Patient: Home Provider: [facility] [place] Office    I discussed the limitations of evaluation and management by telemedicine and the availability of in person appointments. The patient expressed understanding and agreed to proceed.   I provided 42 minutes of non-face-to-face time during this encounter.   [name], LCSW  THERAPIST PROGRESS NOTE            Session Time: Friday 10/142022 9:12 AM - 9:54 AM  Participation Level: Active  Behavioral Response: CasualAlert/euthymic Type of Therapy: Individual Therapy  Treatment Goals addressed: Patient states wanting to stop being so depressed all the time, wanting to work on her attitude/ Alleviate symptoms of depression,resume normal interest in activities and socialization  Interventions: CBT and Supportive  Summary: Susan Ball is a 50 y.o. female who p is referred for services by [name]. She just completed IOP on March 27, 2019. She has long standing history of bipolar disorder and borderline personality disorder. She has had 3 psychiatric hospitalizations. The last was in 2010 at [facility] due to suicide attempt. She participated in outpatient therapy intermittently at [facility] for 3 years. She last was seen there 6 months ago.  Current symptoms include depressed mood, crying spells, mood swings, aggression, anger, excessive worry, sleep difficulty, poor concentration, and memory difficulty.  Patient was seen via virtual visit about two weeks ago.  She reports being in a stable mood and denies any symptoms of depression since last session.  However, she reports facing several health issues and being started on prednisone this past Tuesday.  She has been using bipolar mood log and observed she has  started to experience some elevation in mood along with irritability since taking prednisone.  She also reports increased sleep difficulty since starting this medication.  However, she has made efforts to become more aware of her thoughts and has worked on trying to take a pause before responding.  She also has informed her husband about mood changes.  Patient has continued to go to the gym and attend work.  She reports continued medication compliance.  Patient reports planning to resume attending aftercare group   Suicidal/Homicidal: No, without plan, without intent  Therapist Response:  reviewed symptoms, praised and reinforced patient's continued medication compliance/increased awareness of thoughts and behavior, praised and reinforced patient's use of bipolar mood log, assisted patient identify ways to cope with mood changes, developed plan with patient to continue to attend the gym, also assisted patient identify how to communicate her needs to her husband regarding his support during changes in her mood, reviewed relaxation techniques including deep breathing/progressive muscle relaxation, assisted patient practice guided imagery as another relaxation technique, developed plan with patient to practice a relaxation technique daily, also sent patient instructions regarding how to access mobile app for progressive muscle relaxation, developed plan with patient to continue using bipolar mood log, encouraged patient to follow through with plans to attend group    Plan: Return again in 2  weeks.  Diagnosis: Axis I: Bipolar Disorder    Axis II: Borderline Personality Dis.    [name], LCSW 11/05/2020

## 2020-11-08 ENCOUNTER — Encounter (HOSPITAL_COMMUNITY): Payer: Self-pay | Admitting: Psychiatry

## 2020-11-08 ENCOUNTER — Other Ambulatory Visit: Payer: Self-pay

## 2020-11-08 ENCOUNTER — Telehealth (HOSPITAL_BASED_OUTPATIENT_CLINIC_OR_DEPARTMENT_OTHER): Payer: Medicare Other | Admitting: Psychiatry

## 2020-11-08 VITALS — Wt 264.0 lb

## 2020-11-08 DIAGNOSIS — F319 Bipolar disorder, unspecified: Secondary | ICD-10-CM

## 2020-11-08 DIAGNOSIS — F6089 Other specific personality disorders: Secondary | ICD-10-CM

## 2020-11-08 DIAGNOSIS — F419 Anxiety disorder, unspecified: Secondary | ICD-10-CM | POA: Diagnosis not present

## 2020-11-08 MED ORDER — FLUOXETINE HCL 40 MG PO CAPS: 40.0000 mg | ORAL_CAPSULE | Freq: Every day | ORAL | 0 refills | Status: DC

## 2020-11-08 MED ORDER — OLANZAPINE 7.5 MG PO TABS: 7.5000 mg | ORAL_TABLET | Freq: Every day | ORAL | 0 refills | Status: DC

## 2020-11-08 MED ORDER — CLONIDINE HCL 0.2 MG PO TABS: 0.2000 mg | ORAL_TABLET | Freq: Every day | ORAL | 0 refills | Status: DC

## 2020-11-08 MED ORDER — LITHIUM CARBONATE ER 300 MG PO TBCR: EXTENDED_RELEASE_TABLET | ORAL | 0 refills | Status: DC

## 2020-11-08 MED ORDER — FLUOXETINE HCL 20 MG PO CAPS: 20.0000 mg | ORAL_CAPSULE | Freq: Every day | ORAL | 0 refills | Status: DC

## 2020-11-08 NOTE — Progress Notes (Signed)
Virtual Visit via Video Note  I connected with Susan Ball on 11/08/20 at 11:00 AM EDT by a video enabled telemedicine application and verified that I am speaking with the correct person using two identifiers.  Location: Patient: Home Provider: Home Office   I discussed the limitations of evaluation and management by telemedicine and the availability of in person appointments. The patient expressed understanding and agreed to proceed.  History of Present Illness: Patient is evaluated by video session.  On the last visit we increased olanzapine and she is now taking 7.5 mg at bedtime.  She noticed much improvement in her mood, irritability, anger, manic symptoms.  She is sleeping better.  Recently she had a visit with her PCP Dr. Johny Blamer and had a blood work.  She reported her last hemoglobin A1c was 4.9.  She has work-up and reported she has blood in the stool and now requires colonoscopy.  Patient told they also did a CT scan of the heart and lung and it was normal.  She also reported her kidney function test was also normal.  Her last lithium level was 0.5 which was done in March.  She is taking prednisone for her shoulder pain and she admitted increased weight gain because she is impulsively eating more.  She do not believe it is olanzapine because she was fine until she started taking steroids.  She is in therapy with [name].  She has no tremor or shakes or any EPS.  She is very reluctant to cut down the medication because she feels her mood is stable.  Her job is going very well.  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  She is getting along with her husband much better.  She denies any paranoia, suicidal thoughts, hallucination.  Patient not involved in any self abusive behavior.  She reported coping much better with the stress and does not get frustrated or irritable.  Past Psychiatric History: H/O Bipolar, anxiety and borderline traits. Taking  meds since age 1.  H/O multiple inpatient and suicidal attempt.  H/O overdose, jump from the car and banging head.  Tried Lamictal (rash), Seroquel, Abilify, doxepin, Trazodone, Vraylar and hydroxyzine. H/O n/c with meds, refusing to eat and needed PICC line. H/O anger, road rage, paranoia and trust issues. Had DBT with good response.    Psychiatric Specialty Exam: Physical Exam  Review of Systems  Weight 264 lb (119.7 kg).There is no height or weight on file to calculate BMI.  General Appearance: Casual  Eye Contact:  Fair  Speech:  Normal Rate  Volume:  Normal  Mood:   still sometimes emotional  Affect:  Congruent  Thought Process:  Descriptions of Associations: Intact  Orientation:  Full (Time, Place, and Person)  Thought Content:  Logical  Suicidal Thoughts:  No  Homicidal Thoughts:  No  Memory:  Immediate;   Fair Recent;   Fair Remote;   Fair  Judgement:  Fair  Insight:  Present  Psychomotor Activity:  Normal  Concentration:  Concentration: Fair and Attention Span: Fair  Recall:  Good  Fund of Knowledge:  Good  Language:  Good  Akathisia:  No  Handed:  Right  AIMS (if indicated):     Assets:  Communication Skills Desire for Improvement Housing Resilience Social Support  ADL's:  Intact  Cognition:  WNL  Sleep:   better      Assessment and Plan: Bipolar disorder type I.  Anxiety.  Cluster  B traits.  Patient doing better with olanzapine but also gained excessive weight which she believes due to steroids taking for her shoulder pain.  Patient has upcoming colonoscopy as patient reported blood in the stool.  We discussed in detail about her possibility of blood sugar, weight gain with the olanzapine but patient elected to cut down and she also like to keep the lithium, Prozac and clonidine.  Patient tried to come off from clonidine but could not sleep better.  I discussed if she continues to gain weight even after stopping the steroids then we may need to consider  adjusting the medication and she agreed with it.  Encouraged to keep appointment with [name].  Continue lithium 300 mg twice a day, Klonopin 0.5 mg twice a day, olanzapine 7.5 mg at bedtime, Prozac 60 mg daily, clonidine 0.2 mg at bedtime.  Recommended to call us back if there is any question or any concern.  Follow-up in 2 months.  Follow Up Instructions:    I discussed the assessment and treatment plan with the patient. The patient was provided an opportunity to ask questions and all were answered. The patient agreed with the plan and demonstrated an understanding of the instructions.   The patient was advised to call back or seek an in-person evaluation if the symptoms worsen or if the condition fails to improve as anticipated.  I provided 27 minutes of non-face-to-face time during this encounter.   Cleotis Nipper, MD

## 2020-11-09 ENCOUNTER — Other Ambulatory Visit: Payer: Self-pay | Admitting: Gastroenterology

## 2020-11-09 DIAGNOSIS — R7989 Other specified abnormal findings of blood chemistry: Secondary | ICD-10-CM

## 2020-11-12 ENCOUNTER — Other Ambulatory Visit: Payer: Medicare Other

## 2020-11-12 ENCOUNTER — Ambulatory Visit
Admission: RE | Admit: 2020-11-12 | Discharge: 2020-11-12 | Disposition: A | Discharge status: Home / Self Care | Payer: Medicare Other | Source: Ambulatory Visit | Attending: Gastroenterology | Admitting: Gastroenterology

## 2020-11-12 DIAGNOSIS — K760 Fatty (change of) liver, not elsewhere classified: Secondary | ICD-10-CM | POA: Diagnosis not present

## 2020-11-12 DIAGNOSIS — R945 Abnormal results of liver function studies: Secondary | ICD-10-CM | POA: Diagnosis not present

## 2020-11-12 DIAGNOSIS — R7989 Other specified abnormal findings of blood chemistry: Secondary | ICD-10-CM

## 2020-11-12 DIAGNOSIS — K7689 Other specified diseases of liver: Secondary | ICD-10-CM | POA: Diagnosis not present

## 2020-11-15 ENCOUNTER — Other Ambulatory Visit: Payer: Medicare Other

## 2020-11-19 ENCOUNTER — Encounter (HOSPITAL_COMMUNITY): Payer: Self-pay

## 2020-11-19 ENCOUNTER — Other Ambulatory Visit: Payer: Self-pay

## 2020-11-19 ENCOUNTER — Ambulatory Visit (INDEPENDENT_AMBULATORY_CARE_PROVIDER_SITE_OTHER): Payer: Medicare Other | Admitting: Psychiatry

## 2020-11-19 DIAGNOSIS — R1013 Epigastric pain: Secondary | ICD-10-CM | POA: Diagnosis not present

## 2020-11-19 DIAGNOSIS — F319 Bipolar disorder, unspecified: Secondary | ICD-10-CM

## 2020-11-19 DIAGNOSIS — Z86010 Personal history of colonic polyps: Secondary | ICD-10-CM | POA: Diagnosis not present

## 2020-11-19 DIAGNOSIS — Z9884 Bariatric surgery status: Secondary | ICD-10-CM | POA: Diagnosis not present

## 2020-11-19 DIAGNOSIS — D509 Iron deficiency anemia, unspecified: Secondary | ICD-10-CM | POA: Diagnosis not present

## 2020-11-19 NOTE — Plan of Care (Signed)
Pt participated in development of plan 
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
## 2020-11-23 DIAGNOSIS — Z713 Dietary counseling and surveillance: Secondary | ICD-10-CM | POA: Diagnosis not present

## 2020-12-03 ENCOUNTER — Ambulatory Visit (HOSPITAL_COMMUNITY): Payer: Medicare Other | Admitting: Psychiatry

## 2020-12-09 NOTE — Progress Notes (Signed)
Cardiology Office Note:    Date:  12/10/2020   ID:  SHATONIA HOOTS, DOB 1970/01/25, MRN [id number]  PCP:  Johny Blamer, MD  Cardiologist:  None  Electrophysiologist:  None   Referring MD: Johny Blamer, MD   Chief Complaint  Patient presents with   Shortness of Breath    History of Present Illness:    Susan Ball is a 50 y.o. female with a hx of BPD who is referred by Dr. Tiburcio Pea for evaluation of dyspnea.  Reports has been having dyspnea and palpitations.  She is very active, reports she exercises at the [facility] 3-4 times per week, will bike for 10 miles and do stairstepper for 2000 steps and lift weights.  Denies any chest pain or dyspnea with this.  However reports she will get short of breath when having episodes of palpitations.  Reports occurring several times per week.  Feels like heart is racing, will last for 5 to 10 minutes.  Also with intermittent lightheadedness, denies any syncope.  Reports he tested positive for OSA about 8 years ago but has not been using CPAP for years.  No smoking history.  Family history includes maternal grandmother had MI in 44s.   Past Medical History:  Diagnosis Date   Anxiety    Bipolar disorder (HCC)    Depression    History of borderline personality disorder     Past Surgical History:  Procedure Laterality Date   GASTRIC BYPASS     INTERSTIM IMPLANT PLACEMENT N/A 08/26/2019   Procedure: [name] IMPLANT FIRST STAGE;  Surgeon: Alfredo Martinez, MD;  Location: [facility];  Service: Urology;  Laterality: N/A;   INTERSTIM IMPLANT PLACEMENT N/A 08/26/2019   Procedure: [name] IMPLANT SECOND STAGE;  Surgeon: Alfredo Martinez, MD;  Location: [facility];  Service: Urology;  Laterality: N/A;    Current Medications: Current Meds  Medication Sig   albuterol (VENTOLIN HFA) 108 (90 Base) MCG/ACT inhaler SMARTSIG:2 Puff(s) By Mouth 4 Times Daily PRN   calcium carbonate (OS-CAL - DOSED IN MG OF ELEMENTAL CALCIUM) 1250 (500 Ca) MG tablet Take 1  tablet by mouth daily with breakfast.    Cholecalciferol 25 MCG (1000 UT) capsule Take 1,000 Units by mouth daily.    cloNIDine (CATAPRES) 0.2 MG tablet Take 1 tablet (0.2 mg total) by mouth at bedtime.   Cyanocobalamin (B-12) 5000 MCG SUBL See admin instructions.   Ferrous Sulfate (SLOW FE) 142 (45 Fe) MG TBCR 1 tablet   FLUoxetine (PROZAC) 20 MG capsule Take 1 capsule (20 mg total) by mouth daily.   FLUoxetine (PROZAC) 40 MG capsule Take 1 capsule (40 mg total) by mouth daily.   fluticasone (FLONASE) 50 MCG/ACT nasal spray 2 sprays   lithium carbonate (LITHOBID) 300 MG CR tablet Take one tab (300 mg total) twice a day.   OLANZapine (ZYPREXA) 7.5 MG tablet Take 1 tablet (7.5 mg total) by mouth at bedtime.   solifenacin (VESICARE) 5 MG tablet Take 5 mg by mouth daily.   Trimethoprim HCl (TRIMPEX PO) Take by mouth.     Allergies:   Fexofenadine and Lamictal [lamotrigine]   Social History   Socioeconomic History   Marital status: Married    Spouse name: Not on file   Number of children: 1   Years of education: Not on file   Highest education level: Not on file  Occupational History   Not on file  Tobacco Use   Smoking status: Never   Smokeless tobacco: Never  Vaping Use  Vaping Use: Never used  Substance and Sexual Activity   Alcohol use: Not Currently   Drug use: Not Currently    Types: Marijuana    Comment: States she stopped smoking THC in 2002   Sexual activity: Yes  Other Topics Concern   Not on file  Social History Narrative   Not on file   Social Determinants of Health   Financial Resource Strain: Not on file  Food Insecurity: Not on file  Transportation Needs: Not on file  Physical Activity: Not on file  Stress: Not on file  Social Connections: Not on file     Family History: The patient's family history includes Depression in her brother and sister.  ROS:   Please see the history of present illness.     All other systems reviewed and are  negative.  EKGs/Labs/Other Studies Reviewed:    The following studies were reviewed today:   EKG:  EKG is  ordered today.  The ekg ordered today demonstrates NSR, rate 61, no ST changes  Recent Labs: 12/10/2020: ALT 28; BUN 8; Creatinine, Ser 0.94; Hemoglobin 10.4; Magnesium 1.9; Platelets 298; Potassium 3.8; Sodium 137; TSH 2.309  Recent Lipid Panel    Component Value Date/Time   CHOL 185 12/10/2020 1500   TRIG 120 12/10/2020 1500   HDL 62 12/10/2020 1500   CHOLHDL 3.0 12/10/2020 1500   VLDL 24 12/10/2020 1500   LDLCALC 99 12/10/2020 1500    Physical Exam:    VS:  BP 134/80   Pulse 63   Ht 5\' 5"  (1.651 m)   Wt 276 lb 9.6 oz (125.5 kg)   SpO2 100%   BMI 46.03 kg/m     Wt Readings from Last 3 Encounters:  12/10/20 276 lb 9.6 oz (125.5 kg)  08/10/20 254 lb (115.2 kg)  08/19/19 (!) 259 lb 7 oz (117.7 kg)     GEN:  Well nourished, well developed in no acute distress HEENT: Normal NECK: No JVD; No carotid bruits LYMPHATICS: No lymphadenopathy CARDIAC: RRR, no murmurs, rubs, gallops RESPIRATORY:  Clear to auscultation without rales, wheezing or rhonchi  ABDOMEN: Soft, non-tender, non-distended MUSCULOSKELETAL:  No edema; No deformity  SKIN: Warm and dry NEUROLOGIC:  Alert and oriented x 3 PSYCHIATRIC:  Normal affect   ASSESSMENT:    1. Heart palpitations   2. Dyspnea on exertion   3. SOB (shortness of breath)   4. Sleep apnea, unspecified type   5. Lipid screening    PLAN:    Palpitations: Description concerning for arrhythmia, will evaluate with Zio patch x7 days.  Check echocardiogram to rule out structural heart disease.  Check TSH, CMP, magnesium, CBC  Dyspnea: Low suspicion for ischemic heart disease, as she exercises regularly with no symptoms.  Dyspnea appears to correspond with palpitations, suspect due to arrhythmia as above.  Check echocardiogram as above.  OSA: Reports was diagnosed 8 years ago but has not been on CPAP in years.  Will refer to  sleep medicine.  Lipid screening: We will check lipid panel  RTC in 3 months   Medication Adjustments/Labs and Tests Ordered: Current medicines are reviewed at length with the patient today.  Concerns regarding medicines are outlined above.  Orders Placed This Encounter  Procedures   TSH   Comprehensive metabolic panel   CBC   Magnesium   Lipid Profile   Ambulatory referral to Pulmonology   LONG TERM MONITOR (3-14 DAYS)   EKG 12-Lead   ECHOCARDIOGRAM COMPLETE   No orders of  the defined types were placed in this encounter.   Patient Instructions  Medication Instructions:  Your physician recommends that you continue on your current medications as directed. Please refer to the Current Medication list given to you today.   Labwork: Lipids, TSH,CMET, Magnesium, CBC  Testing/Procedures: Your physician has requested that you have an echocardiogram. Echocardiography is a painless test that uses sound waves to create images of your heart. It provides your doctor with information about the size and shape of your heart and how well your heart's chambers and valves are working. This procedure takes approximately one hour. There are no restrictions for this procedure.     ZIO XT- Long Term Monitor Instructions   Your physician has requested you wear your ZIO patch monitor_____7__days.   This is a single patch monitor.  [employer] supplies one patch monitor per enrollment.  Additional stickers are not available.   Please do not apply patch if you will be having a Nuclear Stress Test, Echocardiogram, Cardiac CT, MRI, or Chest Xray during the time frame you would be wearing the monitor. The patch cannot be worn during these tests.  You cannot remove and re-apply the ZIO XT patch monitor.       Do not shower for the first 24 hours.  You may shower after the first 24 hours.   Press button if you feel a symptom. You will hear a small click.  Record Date, Time and Symptom in the Patient Log  Book.   When you are ready to remove patch, follow instructions on last 2 pages of Patient Log Book.  Stick patch monitor onto last page of Patient Log Book.   Place Patient Log Book in [place] box.  Use locking tab on box and tape box closed securely.  The [employer] has [employer] on it.  Please place in mailbox as soon as possible.  Your physician should have your test results approximately 7 days after the monitor has been mailed back to [facility].   Call [facility] Customer Care at [phone] if you have questions regarding your ZIO XT patch monitor.  Call them immediately if you see an orange light blinking on your monitor.   If your monitor falls off in less than 4 days contact our Monitor department at [phone].  If your monitor becomes loose or falls off after 4 days call [employer] at [phone] for suggestions on securing your monitor.    Follow-Up: 3 months with MD  Any Other Special Instructions Will Be Listed Below (If Applicable).    You have been referred to pulmonary medicine in [place] for treatment of sleep apnea.   If you need a refill on your cardiac medications before your next appointment, please call your pharmacy.   Signed, Little Ishikawa, MD  12/10/2020 10:28 PM    [facility]

## 2020-12-10 ENCOUNTER — Encounter: Payer: Self-pay | Admitting: Cardiology

## 2020-12-10 ENCOUNTER — Ambulatory Visit (INDEPENDENT_AMBULATORY_CARE_PROVIDER_SITE_OTHER): Payer: Medicare Other | Admitting: Cardiology

## 2020-12-10 ENCOUNTER — Other Ambulatory Visit (HOSPITAL_COMMUNITY)
Admission: RE | Admit: 2020-12-10 | Discharge: 2020-12-10 | Disposition: A | Discharge status: Home / Self Care | Payer: Medicare Other | Source: Ambulatory Visit | Attending: Cardiology | Admitting: Cardiology

## 2020-12-10 ENCOUNTER — Other Ambulatory Visit: Payer: Self-pay

## 2020-12-10 ENCOUNTER — Ambulatory Visit (INDEPENDENT_AMBULATORY_CARE_PROVIDER_SITE_OTHER): Payer: Medicare Other

## 2020-12-10 VITALS — BP 134/80 | HR 63 | Ht 65.0 in | Wt 276.6 lb

## 2020-12-10 DIAGNOSIS — Z13220 Encounter for screening for lipoid disorders: Secondary | ICD-10-CM | POA: Insufficient documentation

## 2020-12-10 DIAGNOSIS — D508 Other iron deficiency anemias: Secondary | ICD-10-CM | POA: Diagnosis not present

## 2020-12-10 DIAGNOSIS — R002 Palpitations: Secondary | ICD-10-CM | POA: Diagnosis not present

## 2020-12-10 DIAGNOSIS — G4730 Sleep apnea, unspecified: Secondary | ICD-10-CM | POA: Diagnosis not present

## 2020-12-10 DIAGNOSIS — F319 Bipolar disorder, unspecified: Secondary | ICD-10-CM | POA: Insufficient documentation

## 2020-12-10 DIAGNOSIS — R0609 Other forms of dyspnea: Secondary | ICD-10-CM | POA: Diagnosis not present

## 2020-12-10 DIAGNOSIS — R0602 Shortness of breath: Secondary | ICD-10-CM | POA: Diagnosis not present

## 2020-12-10 LAB — COMPREHENSIVE METABOLIC PANEL
ALT: 28 U/L (ref 0–44)
AST: 35 U/L (ref 15–41)
Albumin: 3.6 g/dL (ref 3.5–5.0)
Alkaline Phosphatase: 71 U/L (ref 38–126)
Anion gap: 8 (ref 5–15)
BUN: 8 mg/dL (ref 6–20)
CO2: 24 mmol/L (ref 22–32)
Calcium: 8.8 mg/dL — ABNORMAL LOW (ref 8.9–10.3)
Chloride: 105 mmol/L (ref 98–111)
Creatinine, Ser: 0.94 mg/dL (ref 0.44–1.00)
GFR, Estimated: >60 mL/min (ref >60)
Glucose, Bld: 126 mg/dL — ABNORMAL HIGH (ref 70–99)
Potassium: 3.8 mmol/L (ref 3.5–5.1)
Sodium: 137 mmol/L (ref 135–145)
Total Bilirubin: 0.5 mg/dL (ref 0.3–1.2)
Total Protein: 6.8 g/dL (ref 6.5–8.1)

## 2020-12-10 LAB — CBC
HCT: 34.2 % — ABNORMAL LOW (ref 36.0–46.0)
Hemoglobin: 10.4 g/dL — ABNORMAL LOW (ref 12.0–15.0)
MCH: 22.9 pg — ABNORMAL LOW (ref 26.0–34.0)
MCHC: 30.4 g/dL (ref 30.0–36.0)
MCV: 75.3 fL — ABNORMAL LOW (ref 80.0–100.0)
Platelets: 298 10*3/uL (ref 150–400)
RBC: 4.54 MIL/uL (ref 3.87–5.11)
RDW: 20 % — ABNORMAL HIGH (ref 11.5–15.5)
WBC: 6.3 10*3/uL (ref 4.0–10.5)
nRBC: 0 % (ref 0.0–0.2)

## 2020-12-10 LAB — LIPID PANEL
Cholesterol: 185 mg/dL (ref 0–200)
HDL: 62 mg/dL (ref >40)
LDL Cholesterol: 99 mg/dL (ref 0–99)
Total CHOL/HDL Ratio: 3 RATIO
Triglycerides: 120 mg/dL (ref <150)
VLDL: 24 mg/dL (ref 0–40)

## 2020-12-10 LAB — MAGNESIUM: Magnesium: 1.9 mg/dL (ref 1.7–2.4)

## 2020-12-10 LAB — TSH: TSH: 2.309 u[IU]/mL (ref 0.350–4.500)

## 2020-12-10 NOTE — Patient Instructions (Signed)
Medication Instructions:  Your physician recommends that you continue on your current medications as directed. Please refer to the Current Medication list given to you today.   Labwork: Lipids, TSH,CMET, Magnesium, CBC  Testing/Procedures: Your physician has requested that you have an echocardiogram. Echocardiography is a painless test that uses sound waves to create images of your heart. It provides your doctor with information about the size and shape of your heart and how well your heart's chambers and valves are working. This procedure takes approximately one hour. There are no restrictions for this procedure.     ZIO XT- Long Term Monitor Instructions   Your physician has requested you wear your ZIO patch monitor_____7__days.   This is a single patch monitor.  [employer] supplies one patch monitor per enrollment.  Additional stickers are not available.   Please do not apply patch if you will be having a Nuclear Stress Test, Echocardiogram, Cardiac CT, MRI, or Chest Xray during the time frame you would be wearing the monitor. The patch cannot be worn during these tests.  You cannot remove and re-apply the ZIO XT patch monitor.       Do not shower for the first 24 hours.  You may shower after the first 24 hours.   Press button if you feel a symptom. You will hear a small click.  Record Date, Time and Symptom in the Patient Log Book.   When you are ready to remove patch, follow instructions on last 2 pages of Patient Log Book.  Stick patch monitor onto last page of Patient Log Book.   Place Patient Log Book in [place] box.  Use locking tab on box and tape box closed securely.  The [employer] has [employer] on it.  Please place in mailbox as soon as possible.  Your physician should have your test results approximately 7 days after the monitor has been mailed back to [facility].   Call [facility] Customer Care at [phone] if you have questions regarding your ZIO XT  patch monitor.  Call them immediately if you see an orange light blinking on your monitor.   If your monitor falls off in less than 4 days contact our Monitor department at [phone].  If your monitor becomes loose or falls off after 4 days call [employer] at [phone] for suggestions on securing your monitor.    Follow-Up: 3 months with MD  Any Other Special Instructions Will Be Listed Below (If Applicable).    You have been referred to pulmonary medicine in [place] for treatment of sleep apnea.   If you need a refill on your cardiac medications before your next appointment, please call your pharmacy.

## 2020-12-13 ENCOUNTER — Other Ambulatory Visit: Payer: Self-pay

## 2020-12-13 ENCOUNTER — Ambulatory Visit (INDEPENDENT_AMBULATORY_CARE_PROVIDER_SITE_OTHER): Payer: Medicare Other | Admitting: Psychiatry

## 2020-12-13 DIAGNOSIS — F319 Bipolar disorder, unspecified: Secondary | ICD-10-CM
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
## 2020-12-16 DIAGNOSIS — R002 Palpitations: Secondary | ICD-10-CM

## 2020-12-20 DIAGNOSIS — H5213 Myopia, bilateral: Secondary | ICD-10-CM | POA: Diagnosis not present

## 2020-12-21 ENCOUNTER — Telehealth (HOSPITAL_BASED_OUTPATIENT_CLINIC_OR_DEPARTMENT_OTHER): Payer: Medicare Other | Admitting: Psychiatry

## 2020-12-21 ENCOUNTER — Encounter (HOSPITAL_COMMUNITY): Payer: Self-pay | Admitting: Psychiatry

## 2020-12-21 ENCOUNTER — Other Ambulatory Visit: Payer: Self-pay

## 2020-12-21 VITALS — Wt 271.0 lb

## 2020-12-21 DIAGNOSIS — F6089 Other specific personality disorders: Secondary | ICD-10-CM

## 2020-12-21 DIAGNOSIS — F4310 Post-traumatic stress disorder, unspecified: Secondary | ICD-10-CM

## 2020-12-21 DIAGNOSIS — F419 Anxiety disorder, unspecified: Secondary | ICD-10-CM

## 2020-12-21 DIAGNOSIS — F319 Bipolar disorder, unspecified: Secondary | ICD-10-CM | POA: Diagnosis not present

## 2020-12-21 MED ORDER — OLANZAPINE 5 MG PO TABS: 5.0000 mg | ORAL_TABLET | Freq: Every day | ORAL | 0 refills | Status: DC

## 2020-12-21 MED ORDER — LITHIUM CARBONATE ER 300 MG PO TBCR: EXTENDED_RELEASE_TABLET | ORAL | 0 refills | Status: DC

## 2020-12-21 MED ORDER — FLUOXETINE HCL 20 MG PO CAPS: 20.0000 mg | ORAL_CAPSULE | Freq: Every day | ORAL | 0 refills | Status: DC

## 2020-12-21 MED ORDER — CLONIDINE HCL 0.2 MG PO TABS: 0.2000 mg | ORAL_TABLET | Freq: Every day | ORAL | 0 refills | Status: DC

## 2020-12-21 MED ORDER — FLUOXETINE HCL 40 MG PO CAPS
40.0000 mg | ORAL_CAPSULE | Freq: Every day | ORAL | 0 refills | Status: DC
Start: 2020-12-21 — End: 2021-02-21

## 2020-12-21 NOTE — Progress Notes (Signed)
Virtual Visit via Video Note  I connected with Susan Ball on 12/21/20 at 11:00 AM EST by a video enabled telemedicine application and verified that I am speaking with the correct person using two identifiers.  Location: Patient: Home Provider: Home Office   I discussed the limitations of evaluation and management by telemedicine and the availability of in person appointments. The patient expressed understanding and agreed to proceed.  History of Present Illness: Patient is evaluated by video session.  She is doing okay but concerned about her weight gain and high blood sugar.  We have this discussion in the past but patient thought that it is caused by steroids.  She took the steroids for only 11 days but is still noticed increased appetite, high blood sugar.  Her last hemoglobin A1c was 4.9.  She is scheduled to have another hemoglobin A1c in January.  She is compliant with medication and overall she feels her mood is better but last week she had very intense therapy session with [name] and that brought her previous memory and she could not sleep well.  She has no tremor or shakes or any EPS.  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  Her job is manageable and she does not feel as stressed.  Her holidays were very quiet because she does not enjoy holiday time.  She lives with her husband who is very supportive.  She is not involved in any self abusive behavior.    Past Psychiatric History: H/O Bipolar, anxiety and borderline traits. On meds since age 33.  H/O inpatient, suicidal attempt, overdose, jump from the car and banging head.  Tried Lamictal (rash), Seroquel, Abilify, doxepin, Trazodone, Vraylar and hydroxyzine. H/O n/c with meds, refusing to eat and needed PICC line. H/O anger, road rage, paranoia and trust issues. Had DBT with good response. Genesight test shows Pristiq, Fetzima and Viibryd, lithium, tegretol and Valproic in better column.   Recent  Results (from the past 2160 hour(s))  Lipid panel     Status: None   Collection Time: 12/10/20  3:00 PM  Result Value Ref Range   Cholesterol 185 0 - 200 mg/dL   Triglycerides 350 <093 mg/dL   HDL 62 >81 mg/dL   Total CHOL/HDL Ratio 3.0 RATIO   VLDL 24 0 - 40 mg/dL   LDL Cholesterol 99 0 - 99 mg/dL    Comment:        Total Cholesterol/HDL:CHD Risk Coronary Heart Disease Risk Table                     Men   Women  1/2 Average Risk   3.4   3.3  Average Risk       5.0   4.4  2 X Average Risk   9.6   7.1  3 X Average Risk  23.4   11.0        Use the calculated Patient Ratio above and the CHD Risk Table to determine the patient's CHD Risk.        ATP III CLASSIFICATION (LDL):  <100     mg/dL   Optimal  829-937  mg/dL   Near or Above                    Optimal  130-159  mg/dL   Borderline  169-678  mg/dL   High  >938     mg/dL   Very High Performed  at [facility], [address], [place], [place] [address]   Magnesium     Status: None   Collection Time: 12/10/20  3:00 PM  Result Value Ref Range   Magnesium 1.9 1.7 - 2.4 mg/dL    Comment: Performed at [facility], [address], [place], [place] [address]  CBC     Status: Abnormal   Collection Time: 12/10/20  3:00 PM  Result Value Ref Range   WBC 6.3 4.0 - 10.5 K/uL   RBC 4.54 3.87 - 5.11 MIL/uL   Hemoglobin 10.4 (L) 12.0 - 15.0 g/dL   HCT 84.1 (L) 66.0 - 63.0 %   MCV 75.3 (L) 80.0 - 100.0 fL   MCH 22.9 (L) 26.0 - 34.0 pg   MCHC 30.4 30.0 - 36.0 g/dL   RDW 16.0 (H) 10.9 - 32.3 %   Platelets 298 150 - 400 K/uL   nRBC 0.0 0.0 - 0.2 %    Comment: Performed at [facility], [address], [place], [place] [address]  Comprehensive metabolic panel     Status: Abnormal   Collection Time: 12/10/20  3:00 PM  Result Value Ref Range   Sodium 137 135 - 145 mmol/L   Potassium 3.8 3.5 - 5.1 mmol/L   Chloride 105 98 - 111 mmol/L   CO2 24 22 - 32 mmol/L   Glucose, Bld 126 (H) 70 - 99 mg/dL    Comment: Glucose reference range  applies only to samples taken after fasting for at least 8 hours.   BUN 8 6 - 20 mg/dL   Creatinine, Ser 2.02 0.44 - 1.00 mg/dL   Calcium 8.8 (L) 8.9 - 10.3 mg/dL   Total Protein 6.8 6.5 - 8.1 g/dL   Albumin 3.6 3.5 - 5.0 g/dL   AST 35 15 - 41 U/L   ALT 28 0 - 44 U/L   Alkaline Phosphatase 71 38 - 126 U/L   Total Bilirubin 0.5 0.3 - 1.2 mg/dL   GFR, Estimated >54 >27 mL/min    Comment: (NOTE) Calculated using the CKD-EPI Creatinine Equation (2021)    Anion gap 8 5 - 15    Comment: Performed at [facility], [address], [place], [place] [address]  TSH     Status: None   Collection Time: 12/10/20  3:02 PM  Result Value Ref Range   TSH 2.309 0.350 - 4.500 uIU/mL    Comment: Performed by a 3rd Generation assay with a functional sensitivity of <=0.01 uIU/mL. Performed at [facility], [address], [place], [place] [address]      Psychiatric Specialty Exam: Physical Exam  Review of Systems  Weight 271 lb (122.9 kg).There is no height or weight on file to calculate BMI.  General Appearance: Casual  Eye Contact:  Fair  Speech:  Slow  Volume:  Normal  Mood:  Anxious  Affect:  Constricted  Thought Process:  Descriptions of Associations: Intact  Orientation:  Full (Time, Place, and Person)  Thought Content:  Rumination  Suicidal Thoughts:  No  Homicidal Thoughts:  No  Memory:  Immediate;   Good Recent;   Fair Remote;   Fair  Judgement:  Intact  Insight:  Present  Psychomotor Activity:  Normal  Concentration:  Concentration: Fair and Attention Span: Fair  Recall:  Good  Fund of Knowledge:  Good  Language:  Good  Akathisia:  No  Handed:  Right  AIMS (if indicated):     Assets:  Communication Skills Desire for Improvement Housing Social  Support  ADL's:  Intact  Cognition:  WNL  Sleep:   ok      Assessment and Plan: Bipolar disorder type I.  Anxiety.  Cluster B traits.  PTSD.  I reviewed blood work results.  Her sugar is high from the past.  She is scheduled  to have her hemoglobin A1c in January.  We talked about reducing the dose of olanzapine from 7.5mg  to 5 mg.  She agreed with the plan.  We also discussed Genesight results, Prozac and olanzapine are not in left column but patient insists to keep the medication because she is taking Prozac for a long time and does not want to change.  However she promised to consider after the holidays to try a different antidepressant.  Other alternative are Pristiq, Viibryd, [place], Geodon, [address], [place].  She agreed to cut down the dose of olanzapine 5 mg.  I encouraged to continue therapy with [name].  Recommend to watch her calorie intake and weight since we cutting down the olanzapine.  Recommended to call us back if symptoms started to get worse.  Follow-up in 2 months.  Follow Up Instructions:    I discussed the assessment and treatment plan with the patient. The patient was provided an opportunity to ask questions and all were answered. The patient agreed with the plan and demonstrated an understanding of the instructions.   The patient was advised to call back or seek an in-person evaluation if the symptoms worsen or if the condition fails to improve as anticipated.  I provided 31 minutes of non-face-to-face time during this encounter.   Cleotis Nipper, MD

## 2020-12-23 DIAGNOSIS — R002 Palpitations: Secondary | ICD-10-CM | POA: Diagnosis not present

## 2020-12-27 ENCOUNTER — Ambulatory Visit (INDEPENDENT_AMBULATORY_CARE_PROVIDER_SITE_OTHER): Payer: Medicare Other | Admitting: Psychiatry

## 2020-12-27 ENCOUNTER — Other Ambulatory Visit: Payer: Self-pay

## 2020-12-27 DIAGNOSIS — F319 Bipolar disorder, unspecified: Secondary | ICD-10-CM | POA: Diagnosis not present

## 2020-12-27 NOTE — Progress Notes (Signed)
Virtual Visit via Telephone Note  I connected with [name] on 12/27/20 at 11:10 AM EST by telephone and verified that I am speaking with the correct person using two identifiers.  Location: Patient: Home Provider: [facility] [place] [facility]    I discussed the limitations, risks, security and privacy concerns of performing an evaluation and management service by telephone and the availability of in person appointments. I also discussed with the patient that there may be a patient responsible charge related to this service. The patient expressed understanding and agreed to proceed.    I provided 40 minutes of non-face-to-face time during this encounter.   [name], LCSW   THERAPIST PROGRESS NOTE            Session Time: Monday 12/27/2020 11:10 AM -11:50 AM   Participation Level: Active  Behavioral Response: CasualAlert/euthymic Type of Therapy: Individual Therapy  Treatment Goals addressed: Patient states wanting to stop being so depressed all the time, wanting to work on her attitude/ Alleviate symptoms of depression,resume normal interest in activities and socialization  Interventions: CBT and Supportive  Summary: TAJANA CROTTEAU is a 50 y.o. female who p is referred for services by [name]. She just completed IOP on March 27, 2019. She has long standing history of bipolar disorder and borderline personality disorder. She has had 3 psychiatric hospitalizations. The last was in 2010 at [facility] due to suicide attempt. She participated in outpatient therapy intermittently at [facility] for 3 years. She last was seen there 6 months ago.  Current symptoms include depressed mood, crying spells, mood swings, aggression, anger, excessive worry, sleep difficulty, poor concentration, and memory difficulty.  Patient was seen via virtual visit about three weeks ago.  Her overall mood has remained stable.  She reports managing Thanksgiving well by giving herself breaks and  disengaging when she started to become overwhelmed.  However, she rejoined the family event once she calmed self.  She reports to anger outburst since last session.  1 involved road rage and other involved conflict with another person at the gym.  However, patient reports the intensity and duration of the outbursts was decreased.  Patient reports continued difficulty recognizing early warning signs and states she goes from 0-100 when she becomes angry.     Suicidal/Homicidal: No, without plan, without intent  Therapist Response:  reviewed symptoms, praised and reinforced patient's continued medication compliance, praised and reinforced patient's use of disengagement and managing family event, discussed effects,  discussed recent anger episodes, assisted patient identify triggers as well as thoughts of processes affecting her action, discussed using information about triggers to her advantage, developed plan with patient to create a list of triggers/reviewthem daily/and to write a plan on how to manage when she has to face her triggers    Plan: Return again in 2  weeks.  Diagnosis: Axis I: Bipolar Disorder    Axis II: Borderline Personality Dis.    [name], LCSW 12/27/2020

## 2021-01-04 ENCOUNTER — Encounter: Payer: Self-pay | Admitting: Orthopedic Surgery

## 2021-01-05 ENCOUNTER — Encounter: Payer: Self-pay | Admitting: Pulmonary Disease

## 2021-01-05 ENCOUNTER — Other Ambulatory Visit: Payer: Self-pay

## 2021-01-05 ENCOUNTER — Ambulatory Visit (INDEPENDENT_AMBULATORY_CARE_PROVIDER_SITE_OTHER): Payer: Medicare Other | Admitting: Pulmonary Disease

## 2021-01-05 VITALS — BP 128/70 | HR 60 | Temp 98.1°F | Ht 65.0 in | Wt 273.6 lb

## 2021-01-05 DIAGNOSIS — G4733 Obstructive sleep apnea (adult) (pediatric): Secondary | ICD-10-CM

## 2021-01-05 DIAGNOSIS — R0602 Shortness of breath: Secondary | ICD-10-CM

## 2021-01-05 NOTE — Assessment & Plan Note (Signed)
It is very likely that she has OSA due to her weight gain  Given excessive daytime somnolence, narrow pharyngeal exam, witnessed apneas & loud snoring, obstructive sleep apnea is very likely & an overnight polysomnogram will be scheduled as a home study. The pathophysiology of obstructive sleep apnea , it's cardiovascular consequences & modes of treatment including CPAP were discused with the patient in detail & they evidenced understanding.   Pretest probably is intermediate to high, she would be willing to use a CPAP if necessary

## 2021-01-05 NOTE — Progress Notes (Signed)
Subjective:    Patient ID: Susan Ball, female    DOB: 1970-09-28, 50 y.o.   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  She reports this is different from her asthma, MDI has not helped.  She is able to workout in the gym, ride a bike for 10 miles or put 2000 steps on the stepper without any shortness of breath.  CT angiogram 10/2020 did not show any pulm embolism.  She underwent cardiac evaluation including a long-term monitor and echocardiogram which was negative and she was referred for sleep evaluation  Epworth sleepiness score is 8 and she reports sleepiness while watching TV or sitting inactive in a public place. She has always had difficulty falling asleep and she takes clonidine at bedtime, bedtime is between 7 and 9 PM, sleep latency can be 1 to 3 hours, she sleeps on her right side with 1 pillow, reports 3-4 nocturnal awakenings and is out of bed at 7 AM with tiredness and dryness of mouth. She has gained 30 pounds in the last 1 year She reports a remote diagnosis of OSA and even used CPAP for a short period but could not tolerate  There is no history suggestive of cataplexy, sleep paralysis or parasomnias  Asthma -no childhood history, started at age 50, trigger change in weather, seasonal allergies perfumes, last episode requiring albuterol was 5 years ago  GERD with esophageal spasms-omeprazole did not work and Dexilant really helped, EGD x2, last 1 month ago.  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, dropped as low as 195 pounds and has gained again to her current weight of 271   Past  Medical History:  Diagnosis Date   Anxiety    Bipolar disorder (HCC)    Depression    History of borderline personality disorder      Review of Systems Shortness of breath at rest and activity Acid heartburn Weight gain Headaches Anxiety depression Joint stiffness  Constitutional: negative for anorexia, fevers and sweats  Eyes: negative for irritation, redness and visual disturbance  Ears, nose, mouth, throat, and face: negative for earaches, epistaxis, nasal congestion and sore throat  Respiratory: negative for cough, sputum and wheezing  Cardiovascular: negative for chest pain, dyspnea, lower extremity edema, orthopnea, palpitations and syncope  Gastrointestinal: negative for abdominal pain, constipation, diarrhea, melena, nausea and vomiting  Genitourinary:negative for dysuria, frequency and hematuria  Hematologic/lymphatic: negative for bleeding, easy bruising and lymphadenopathy  Musculoskeletal:negative for arthralgias, muscle weakness  Neurological: negative for coordination problems, gait problems, headaches and weakness  Endocrine: negative for diabetic symptoms including polydipsia, polyuria and weight loss     Objective:   Physical Exam  Gen. Pleasant, obese, in no distress, normal affect ENT - no pallor,icterus, no post nasal drip, class 2-3 airway Neck: No JVD, no thyromegaly, no carotid bruits Lungs: no use of accessory muscles, no dullness to percussion, decreased without rales or rhonchi  Cardiovascular: Rhythm regular, heart sounds  normal, no murmurs or gallops, no peripheral edema Abdomen: soft and non-tender, no hepatosplenomegaly, BS normal. Musculoskeletal: No deformities, no cyanosis or clubbing Neuro:  alert, non focal,  no tremors       Assessment & Plan:    Episodic dyspnea -occurs at rest but not on heavy exertion including exercise in the gym, this is atypical and likely not cardiac or pulmonary.  Pulm embolism has been ruled out.  I offered  her spirometry but she does not seem to be like her usual asthma, she defers this testing for now.  Most likely cause would be anxiety or VCD, we discussed behavior techniques including abdominal breathing and pursed lip breathing during an episode to see if she could shorten the duration

## 2021-01-05 NOTE — Patient Instructions (Signed)
°  X Home sleep study   we discussed breathing techniques

## 2021-01-10 ENCOUNTER — Other Ambulatory Visit: Payer: Self-pay

## 2021-01-10 ENCOUNTER — Telehealth (HOSPITAL_COMMUNITY): Payer: Self-pay | Admitting: Psychiatry

## 2021-01-10 ENCOUNTER — Ambulatory Visit (INDEPENDENT_AMBULATORY_CARE_PROVIDER_SITE_OTHER): Payer: Medicare Other | Admitting: Psychiatry

## 2021-01-10 DIAGNOSIS — F319 Bipolar disorder, unspecified: Secondary | ICD-10-CM

## 2021-01-10 NOTE — Telephone Encounter (Signed)
Therapist attempted to contact patient twice via text through care agility platform, no response.  Therapist called patient,  left message indicating attempt, and requesting patient call office.

## 2021-01-10 NOTE — Progress Notes (Signed)
Virtual Visit via Video Note  I connected with Susan Ball on 01/10/21 at 11:35 AM EST  by a video enabled telemedicine application and verified that I am speaking with the correct person using two identifiers.  Location: Patient: [facility] [place] Provider: [facility] [place] office    I discussed the limitations of evaluation and management by telemedicine and the availability of in person appointments. The patient expressed understanding and agreed to proceed.  The patient was advised to call back or seek an in-person evaluation if the symptoms worsen or if the condition fails to improve as anticipated.  I provided 19 minutes of non-face-to-face time during this encounter.   [name], LCSW   THERAPIST PROGRESS NOTE            Session Time: Monday 01/10/2021 11:35 AM - 11:54 AM   Participation Level: Active  Behavioral Response: CasualAlert/euthymic Type of Therapy: Individual Therapy  Treatment Goals addressed: Patient states wanting to stop being so depressed all the time, wanting to work on her attitude/ Alleviate symptoms of depression,resume normal interest in activities and socialization  Interventions: CBT and Supportive  Summary: Susan Ball is a 50 y.o. female who p is referred for services by [name]. She just completed IOP on March 27, 2019. She has long standing history of bipolar disorder and borderline personality disorder. She has had 3 psychiatric hospitalizations. The last was in 2010 at [facility] due to suicide attempt. She participated in outpatient therapy intermittently at [facility] for 3 years. She last was seen there 6 months ago.  Current symptoms include depressed mood, crying spells, mood swings, aggression, anger, excessive worry, sleep difficulty, poor concentration, and memory difficulty.  Patient was seen via virtual visit about three weeks ago.  Her overall mood has remained stable.  She reports continued stress and reports having anger  outbursts.she reports trying to use self talk, deep breathing, and progressive muscle relaxation to try to manage anger.  She completed homework assignment of identifying her most 5 prevalent triggers which include disrespect, being underestimated, seeing someone bully someone else, being around people who have a sense of entitlement, and people driving slow in the fast lane.    Suicidal/Homicidal: No, without plan, without intent  Therapist Response:  reviewed symptoms, praised patient's efforts to complete homework as well as her efforts to use helpful anger management techniques, developed plan with patient to practice relaxation techniques regularly, began to assist patient identify connection between her thoughts/mood/behavior, began to assist patient examine her thought patterns,    Plan: Return again in 2  weeks.  Diagnosis: Axis I: Bipolar Disorder    Axis II: Borderline Personality Dis.    [name], LCSW 01/10/2021

## 2021-01-25 ENCOUNTER — Encounter: Payer: Self-pay | Admitting: Orthopedic Surgery

## 2021-01-25 ENCOUNTER — Ambulatory Visit (HOSPITAL_COMMUNITY): Payer: Medicare Other

## 2021-01-25 ENCOUNTER — Ambulatory Visit: Payer: Commercial Managed Care - HMO

## 2021-01-25 ENCOUNTER — Other Ambulatory Visit: Payer: Self-pay

## 2021-01-25 ENCOUNTER — Ambulatory Visit (INDEPENDENT_AMBULATORY_CARE_PROVIDER_SITE_OTHER): Payer: Commercial Managed Care - HMO | Admitting: Orthopedic Surgery

## 2021-01-25 ENCOUNTER — Ambulatory Visit (INDEPENDENT_AMBULATORY_CARE_PROVIDER_SITE_OTHER): Payer: 59 | Admitting: Psychiatry

## 2021-01-25 VITALS — Ht 65.0 in | Wt 274.0 lb

## 2021-01-25 DIAGNOSIS — M25561 Pain in right knee: Secondary | ICD-10-CM

## 2021-01-25 DIAGNOSIS — M25562 Pain in left knee: Secondary | ICD-10-CM | POA: Diagnosis not present

## 2021-01-25 DIAGNOSIS — F319 Bipolar disorder, unspecified: Secondary | ICD-10-CM

## 2021-01-25 DIAGNOSIS — F419 Anxiety disorder, unspecified: Secondary | ICD-10-CM | POA: Diagnosis not present

## 2021-01-25 DIAGNOSIS — G8929 Other chronic pain: Secondary | ICD-10-CM | POA: Diagnosis not present

## 2021-01-25 NOTE — Patient Instructions (Signed)
Instructions Following Joint Injections  In clinic today, you received an injection in one of your joints (sometimes more than one).  Occasionally, you can have some pain at the injection site, this is normal.  You can place ice at the injection site, or take over-the-counter medications such as Tylenol (acetaminophen) or Advil (ibuprofen).  Please follow all directions listed on the bottle.  If your joint (knee or shoulder) becomes swollen, red or very painful, please contact the clinic for additional assistance.   Two medications were injected, including lidocaine and a steroid (often referred to as cortisone).  Lidocaine is effective almost immediately but wears off quickly.  However, the steroid can take a few days to improve your symptoms.  In some cases, it can make your pain worse for a couple of days.  Do not be concerned if this happens as it is common.  You can apply ice or take some over-the-counter medications as needed.      Knee Exercises  Ask your health care provider which exercises are safe for you. Do exercises exactly as told by your health care provider and adjust them as directed. It is normal to feel mild stretching, pulling, tightness, or discomfort as you do these exercises. Stop right away if you feel sudden pain or your pain gets worse. Do not begin these exercises until told by your health care provider.  Stretching and range-of-motion exercises These exercises warm up your muscles and joints and improve the movement and flexibility of your knee. These exercises also help to relieve pain and swelling.  Knee extension, prone Lie on your abdomen (prone position) on a bed. Place your left / right knee just beyond the edge of the surface so your knee is not on the bed. You can put a towel under your left / right thigh just above your kneecap for comfort. Relax your leg muscles and allow gravity to straighten your knee (extension). You should feel a stretch behind your left  / right knee. Hold this position for 10 seconds. Scoot up so your knee is supported between repetitions. Repeat 10 times. Complete this exercise 3-4 times per week.     Knee flexion, active Lie on your back with both legs straight. If this causes back discomfort, bend your left / right knee so your foot is flat on the floor. Slowly slide your left / right heel back toward your buttocks. Stop when you feel a gentle stretch in the front of your knee or thigh (flexion). Hold this position for 10 seconds. Slowly slide your left / right heel back to the starting position. Repeat 10 times. Complete this exercise 3-4 times per week.      Quadriceps stretch, prone Lie on your abdomen on a firm surface, such as a bed or padded floor. Bend your left / right knee and hold your ankle. If you cannot reach your ankle or pant leg, loop a belt around your foot and grab the belt instead. Gently pull your heel toward your buttocks. Your knee should not slide out to the side. You should feel a stretch in the front of your thigh and knee (quadriceps). Hold this position for 10 seconds. Repeat 10 times. Complete this exercise 3-4 times per week.      Hamstring, supine Lie on your back (supine position). Loop a belt or towel over the ball of your left / right foot. The ball of your foot is on the walking surface, right under your toes. Straighten your left /   right knee and slowly pull on the belt to raise your leg until you feel a gentle stretch behind your knee (hamstring). Do not let your knee bend while you do this. Keep your other leg flat on the floor. Hold this position for 10 seconds. Repeat 10 times. Complete this exercise 3-4 times per week.   Strengthening exercises These exercises build strength and endurance in your knee. Endurance is the ability to use your muscles for a long time, even after they get tired.  Quadriceps, isometric This exercise stretches the muscles in front of your thigh  (quadriceps) without moving your knee joint (isometric). Lie on your back with your left / right leg extended and your other knee bent. Put a rolled towel or small pillow under your knee if told by your health care provider. Slowly tense the muscles in the front of your left / right thigh. You should see your kneecap slide up toward your hip or see increased dimpling just above the knee. This motion will push the back of the knee toward the floor. For 10 seconds, hold the muscle as tight as you can without increasing your pain. Relax the muscles slowly and completely. Repeat 10 times. Complete this exercise 3-4 times per week. .     Straight leg raises This exercise stretches the muscles in front of your thigh (quadriceps) and the muscles that move your hips (hip flexors). Lie on your back with your left / right leg extended and your other knee bent. Tense the muscles in the front of your left / right thigh. You should see your kneecap slide up or see increased dimpling just above the knee. Your thigh may even shake a bit. Keep these muscles tight as you raise your leg 4-6 inches (10-15 cm) off the floor. Do not let your knee bend. Hold this position for 10 seconds. Keep these muscles tense as you lower your leg. Relax your muscles slowly and completely after each repetition. Repeat 10 times. Complete this exercise 3-4 times per week.  Hamstring, isometric Lie on your back on a firm surface. Bend your left / right knee about 30 degrees. Dig your left / right heel into the surface as if you are trying to pull it toward your buttocks. Tighten the muscles in the back of your thighs (hamstring) to "dig" as hard as you can without increasing any pain. Hold this position for 10 seconds. Release the tension gradually and allow your muscles to relax completely for __________ seconds after each repetition. Repeat 10 times. Complete this exercise 3-4 times per week.  Hamstring curls If told by your  health care provider, do this exercise while wearing ankle weights. Begin with 5 lb weights. Then increase the weight by 1 lb (0.5 kg) increments. You can also use an exercise band Lie on your abdomen with your legs straight. Bend your left / right knee as far as you can without feeling pain. Keep your hips flat against the floor. Hold this position for 10 seconds. Slowly lower your leg to the starting position. Repeat 10 times. Complete this exercise 3-4 times per week.      Squats This exercise strengthens the muscles in front of your thigh and knee (quadriceps). Stand in front of a table, with your feet and knees pointing straight ahead. You may rest your hands on the table for balance but not for support. Slowly bend your knees and lower your hips like you are going to sit in a chair. Keep   your weight over your heels, not over your toes. Keep your lower legs upright so they are parallel with the table legs. Do not let your hips go lower than your knees. Do not bend lower than told by your health care provider. If your knee pain increases, do not bend as low. Hold the squat position for 10 seconds. Slowly push with your legs to return to standing. Do not use your hands to pull yourself to standing. Repeat 10 times. Complete this exercise 3-4 times per week .     Wall slides This exercise strengthens the muscles in front of your thigh and knee (quadriceps). Lean your back against a smooth wall or door, and walk your feet out 18-24 inches (46-61 cm) from it. Place your feet hip-width apart. Slowly slide down the wall or door until your knees bend 90 degrees. Keep your knees over your heels, not over your toes. Keep your knees in line with your hips. Hold this position for 10 seconds. Repeat 10 times. Complete this exercise 3-4 times per week.      Straight leg raises This exercise strengthens the muscles that rotate the leg at the hip and move it away from your body (hip  abductors). Lie on your side with your left / right leg in the top position. Lie so your head, shoulder, knee, and hip line up. You may bend your bottom knee to help you keep your balance. Roll your hips slightly forward so your hips are stacked directly over each other and your left / right knee is facing forward. Leading with your heel, lift your top leg 4-6 inches (10-15 cm). You should feel the muscles in your outer hip lifting. Do not let your foot drift forward. Do not let your knee roll toward the ceiling. Hold this position for 10 seconds. Slowly return your leg to the starting position. Let your muscles relax completely after each repetition. Repeat 10 times. Complete this exercise 3-4 times per week.      Straight leg raises This exercise stretches the muscles that move your hips away from the front of the pelvis (hip extensors). Lie on your abdomen on a firm surface. You can put a pillow under your hips if that is more comfortable. Tense the muscles in your buttocks and lift your left / right leg about 4-6 inches (10-15 cm). Keep your knee straight as you lift your leg. Hold this position for 10 seconds. Slowly lower your leg to the starting position. Let your leg relax completely after each repetition. Repeat 10 times. Complete this exercise 3-4 times per week.  

## 2021-01-25 NOTE — Progress Notes (Signed)
New Patient Visit  Assessment: Susan Ball is a 51 y.o. female with the following: 1. Acute pain of right knee 2. Chronic pain of left knee  Plan: Patient's had worsening pain in the right knee for the past 2 months.  No specific injury.  Mild discomfort in the left knee, but that is not bothering her as much.  On radiographs obtained in clinic today, she has mild arthritis overall, with mild loss of joint space within the lateral compartment, and some small osteophytes.  We discussed multiple treatment options, and she would like to proceed with an injection, as well as home exercises.  The left knee is not bothering her sufficiently enough that she wants an injection in her left knee.  No follow-up is needed at this time, but if this does not improve her symptoms, we may have to consider obtaining an MRI.  Procedure note injection Right knee joint   Verbal consent was obtained to inject the right knee joint  Timeout was completed to confirm the site of injection.  The skin was prepped with alcohol and ethyl chloride was sprayed at the injection site.  A 21-gauge needle was used to inject 40 mg of Depo-Medrol and 1% lidocaine (3 cc) into the right knee using an anterolateral approach.  There were no complications. A sterile bandage was applied.    Follow-up: Return if symptoms worsen or fail to improve.  Subjective:  Chief Complaint  Patient presents with   Knee Pain    Bilat knee pain getting worse over past 2 mo,  Rt > Lt.    History of Present Illness: Susan Ball is a 51 y.o. female who has been referred to clinic today for right knee pain.  She has had ongoing pain in the right knee for couple of years.  It worsened approximately 2 months ago.  No specific injury.  No twisting event.  She does notice some swelling in the knee occasionally.  Pain is primarily located on the lateral aspect of her knee.  She also has some mild discomfort in the left knee, but is not as  bad as the right knee.  She does not take any medications for her knees.  No prior physical therapy.  She is never had an injection.  She notes occasionally popping.  No locking sensations.   Review of Systems: No fevers or chills No numbness or tingling No chest pain No shortness of breath No bowel or bladder dysfunction No GI distress No headaches   Medical History:  Past Medical History:  Diagnosis Date   Anxiety    Bipolar disorder (HCC)    Depression    History of borderline personality disorder     Past Surgical History:  Procedure Laterality Date   GASTRIC BYPASS     INTERSTIM IMPLANT PLACEMENT N/A 08/26/2019   Procedure: [name] IMPLANT FIRST STAGE;  Surgeon: Alfredo Martinez, MD;  Location: [facility];  Service: Urology;  Laterality: N/A;   INTERSTIM IMPLANT PLACEMENT N/A 08/26/2019   Procedure: [name] IMPLANT SECOND STAGE;  Surgeon: Alfredo Martinez, MD;  Location: [facility];  Service: Urology;  Laterality: N/A;    Family History  Problem Relation Age of Onset   Depression Sister    Depression Brother    Social History   Tobacco Use   Smoking status: Never   Smokeless tobacco: Never  Vaping Use   Vaping Use: Never used  Substance Use Topics   Alcohol use: Not Currently   Drug use:  Not Currently    Types: Marijuana    Comment: States she stopped smoking THC in 2002    Allergies  Allergen Reactions   Fexofenadine Other (See Comments)   Lamictal [Lamotrigine]     Body rash     Current Meds  Medication Sig   albuterol (VENTOLIN HFA) 108 (90 Base) MCG/ACT inhaler SMARTSIG:2 Puff(s) By Mouth 4 Times Daily PRN   calcium carbonate (OS-CAL - DOSED IN MG OF ELEMENTAL CALCIUM) 1250 (500 Ca) MG tablet Take 1 tablet by mouth daily with breakfast.    Cholecalciferol 25 MCG (1000 UT) capsule Take 1,000 Units by mouth daily.    cloNIDine (CATAPRES) 0.2 MG tablet Take 1 tablet (0.2 mg total) by mouth at bedtime.   Cyanocobalamin (B-12) 5000 MCG SUBL See admin  instructions.   Ferrous Sulfate (SLOW FE) 142 (45 Fe) MG TBCR 1 tablet   FLUoxetine (PROZAC) 20 MG capsule Take 1 capsule (20 mg total) by mouth daily.   FLUoxetine (PROZAC) 40 MG capsule Take 1 capsule (40 mg total) by mouth daily.   fluticasone (FLONASE) 50 MCG/ACT nasal spray 2 sprays   lithium carbonate (LITHOBID) 300 MG CR tablet Take one tab (300 mg total) twice a day.   OLANZapine (ZYPREXA) 5 MG tablet Take 1 tablet (5 mg total) by mouth at bedtime.   solifenacin (VESICARE) 5 MG tablet Take 5 mg by mouth daily.   Trimethoprim HCl (TRIMPEX PO) Take by mouth.    Objective: Ht 5\' 5"  (1.651 m)    Wt 274 lb (124.3 kg)    LMP 01/18/2021 (Approximate)    BMI 45.60 kg/m   Physical Exam:  General: Alert and oriented. and No acute distress. Gait: Normal gait.  Evaluation the right knee demonstrates a mild effusion.  Tenderness to palpation along the lateral joint line.  No increased laxity to varus or valgus stress.  She is able to achieve full extension.  She tolerates flexion beyond 120 degrees.  Negative Lachman.  Evaluation of the left knee demonstrates neutral overall alignment.  No effusion.  No tenderness to palpation.  Easily gets to full extension.  Flexion beyond 120 degrees.  Negative Lachman.  No increased laxity varus or valgus stress.  IMAGING: I personally ordered and reviewed the following images  X-rays of both knees were obtained in clinic today.  Neutral overall alignment.  Mild loss of joint space within the lateral compartment of the right knee.  Small osteophytes within all 3 compartments.  Maintained joint space within the left knee, within all 3 compartments.  Small osteophytes are noted within all 3 compartments as well.  No acute injuries are noted.  Impression: Mild bilateral knee arthritis   New Medications:  No orders of the defined types were placed in this encounter.     01/20/2021, MD  01/25/2021 10:37 AM
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
## 2021-02-08 ENCOUNTER — Other Ambulatory Visit: Payer: Self-pay

## 2021-02-08 ENCOUNTER — Ambulatory Visit (INDEPENDENT_AMBULATORY_CARE_PROVIDER_SITE_OTHER): Payer: 59 | Admitting: Psychiatry

## 2021-02-08 DIAGNOSIS — F319 Bipolar disorder, unspecified: Secondary | ICD-10-CM | POA: Diagnosis not present

## 2021-02-08 NOTE — Progress Notes (Signed)
Virtual Visit via Video Note  I connected with Susan Ball on 02/08/21 at 11:10 AM EST  by a video enabled telemedicine application and verified that I am speaking with the correct person using two identifiers.  Location: Patient: Home Provider:  [facility] [place] office    I discussed the limitations of evaluation and management by telemedicine and the availability of in person appointments. The patient expressed understanding and agreed to proceed.   I provided 41 minutes of non-face-to-face time during this encounter.   [name], LCSW   THERAPIST PROGRESS NOTE            Session Time: Tuesday  02/08/2021 11:10 AM - 11:51 AM   Participation Level: Active  Behavioral Response: CasualAlert/euthymic Type of Therapy: Individual Therapy  Treatment Goals addressed: Patient states wanting to stop being so depressed all the time, wanting to work on her attitude/ Alleviate symptoms of depression,resume normal interest in activities and socialization  Interventions: CBT and Supportive  Summary: Susan Ball is a 51 y.o. female who p is referred for services by [name]. She just completed IOP on March 27, 2019. She has long standing history of bipolar disorder and borderline personality disorder. She has had 3 psychiatric hospitalizations. The last was in 2010 at [facility] due to suicide attempt. She participated in outpatient therapy intermittently at [facility] for 3 years. She last was seen there 6 months ago.  Current symptoms include depressed mood, crying spells, mood swings, aggression, anger, excessive worry, sleep difficulty, poor concentration, and memory difficulty.  Patient was seen via virtual visit about three weeks ago.  She reports increased use of weed stating using it 2-3 times per day.  She states having no emotional outburst or aggressive behaviors since last session as the weed keeps her calm.  She says she is able to let go of things that normally would  bother her and create more conflict.  She denies any symptoms of depression but states not wanting to have to deal with any type of stress or conflict.  She states wanting to stay calm and reports the weed helps her do this.  She is scheduled to see psychiatrist Dr. Lolly Mustache next week and will discuss this along with concerns about the effects of the reduction in olanzapine.  She reports she continues to work 2 days a week and does not use with doing work but uses it as soon as she gets home.  She also reports she does not drive when using weed.  She continues to workout at the gym.     Suicidal/Homicidal: No, without plan, without intent  Therapist Response:  reviewed symptoms,  discussed stressors, facilitated expression of thoughts and feelings, validated feelings, encouraged patient to follow-up with Dr. Lolly Mustache and discuss concerns as well as her illicit drug use, reviewed rationale for practicing mindfulness technique, discussed and addressed difficulty patient had implementing technique,   Plan: Return again in 2  weeks.  Diagnosis: Axis I: Bipolar Disorder    Axis II: Borderline Personality Dis.    [name], LCSW 02/08/2021

## 2021-02-09 ENCOUNTER — Ambulatory Visit: Payer: Medicare Other | Admitting: Cardiology

## 2021-02-09 ENCOUNTER — Other Ambulatory Visit (HOSPITAL_COMMUNITY): Payer: Self-pay | Admitting: Psychiatry

## 2021-02-09 DIAGNOSIS — F319 Bipolar disorder, unspecified: Secondary | ICD-10-CM

## 2021-02-09 DIAGNOSIS — F6089 Other specific personality disorders: Secondary | ICD-10-CM

## 2021-02-09 DIAGNOSIS — F419 Anxiety disorder, unspecified: Secondary | ICD-10-CM

## 2021-02-17 ENCOUNTER — Other Ambulatory Visit: Payer: Self-pay

## 2021-02-17 ENCOUNTER — Ambulatory Visit (INDEPENDENT_AMBULATORY_CARE_PROVIDER_SITE_OTHER): Payer: Commercial Managed Care - HMO

## 2021-02-17 DIAGNOSIS — G4733 Obstructive sleep apnea (adult) (pediatric): Secondary | ICD-10-CM

## 2021-02-21 ENCOUNTER — Encounter (HOSPITAL_COMMUNITY): Payer: Self-pay | Admitting: Psychiatry

## 2021-02-21 ENCOUNTER — Other Ambulatory Visit: Payer: Self-pay

## 2021-02-21 ENCOUNTER — Telehealth (HOSPITAL_BASED_OUTPATIENT_CLINIC_OR_DEPARTMENT_OTHER): Payer: Medicare Other | Admitting: Psychiatry

## 2021-02-21 ENCOUNTER — Other Ambulatory Visit (HOSPITAL_COMMUNITY): Payer: Self-pay | Admitting: *Deleted

## 2021-02-21 VITALS — Wt 274.0 lb

## 2021-02-21 DIAGNOSIS — F4310 Post-traumatic stress disorder, unspecified: Secondary | ICD-10-CM | POA: Diagnosis not present

## 2021-02-21 DIAGNOSIS — F6089 Other specific personality disorders: Secondary | ICD-10-CM | POA: Diagnosis not present

## 2021-02-21 DIAGNOSIS — Z79899 Other long term (current) drug therapy: Secondary | ICD-10-CM

## 2021-02-21 DIAGNOSIS — F419 Anxiety disorder, unspecified: Secondary | ICD-10-CM

## 2021-02-21 DIAGNOSIS — F319 Bipolar disorder, unspecified: Secondary | ICD-10-CM

## 2021-02-21 MED ORDER — OLANZAPINE 5 MG PO TABS: 5.0000 mg | ORAL_TABLET | Freq: Every day | ORAL | 0 refills | Status: DC

## 2021-02-21 MED ORDER — FLUOXETINE HCL 20 MG PO CAPS: 20.0000 mg | ORAL_CAPSULE | Freq: Every day | ORAL | 0 refills | Status: DC

## 2021-02-21 MED ORDER — CLONIDINE HCL 0.2 MG PO TABS: 0.2000 mg | ORAL_TABLET | Freq: Every day | ORAL | 0 refills | Status: DC

## 2021-02-21 MED ORDER — FLUOXETINE HCL 40 MG PO CAPS: 40.0000 mg | ORAL_CAPSULE | Freq: Every day | ORAL | 0 refills | Status: DC

## 2021-02-21 MED ORDER — LITHIUM CARBONATE ER 450 MG PO TBCR: EXTENDED_RELEASE_TABLET | ORAL | 0 refills | Status: DC

## 2021-02-21 NOTE — Progress Notes (Signed)
Virtual Visit via Telephone Note  I connected with Susan Ball on 02/21/21 at 10:40 AM EST by telephone and verified that I am speaking with the correct person using two identifiers.  Location: Patient: Home Provider: Home Office   I discussed the limitations, risks, security and privacy concerns of performing an evaluation and management service by telephone and the availability of in person appointments. I also discussed with the patient that there may be a patient responsible charge related to this service. The patient expressed understanding and agreed to proceed.   History of Present Illness: Patient is evaluated by phone session.  We cut down her olanzapine from the last visit because of weight.  However she has not seen a significant improvement and actually gained 3 pounds since the last visit.  She admitted to still have episodes of depression and sometimes passive and fleeting suicidal thoughts but no plan or any intent.  She denies any hallucination, paranoia but endorsed sometimes lack of motivation to do things.  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  She is concerned about her husband who has been drinking too much especially on his days off.  She is also concerned about her mother who lives close by whose husband is not doing very well and daughter has given 6 months to live.  Patient trying to balance her life and trying to help her mother, to be supportive to her husband and also trying to work.  She also in therapy with Florencia ReasonsPeggy Bynum.  She has nightmares and flashback and her husband reported few times that she talked to herself and sleep.  She has not involved in any self abusive behavior.  She has anxiety but denies any panic attack.  She denies any aggression, violence, mania or any impulsive behavior.  Past Psychiatric History: H/O Bipolar, anxiety and  borderline traits. On meds since age 51.  H/O inpatient, suicidal attempt, overdose, jump from the car and banging head.  Tried Lamictal (rash), Seroquel, Abilify, doxepin, Trazodone, Vraylar and hydroxyzine. H/O n/c with meds, refusing to eat and needed PICC line. H/O anger, road rage, paranoia and trust issues. Had DBT with good response. Genesight test shows Pristiq, Fetzima and Viibryd, lithium, tegretol and Valproic in better column.   Recent Results (from the past 2160 hour(s))  Lipid panel     Status: None   Collection Time: 12/10/20  3:00 PM  Result Value Ref Range   Cholesterol 185 0 - 200 mg/dL   Triglycerides 098120 <119<150 mg/dL   HDL 62 >14>40 mg/dL   Total CHOL/HDL Ratio 3.0 RATIO   VLDL 24 0 - 40 mg/dL   LDL Cholesterol 99 0 - 99 mg/dL    Comment:        Total Cholesterol/HDL:CHD Risk Coronary Heart Disease Risk Table                     Men   Women  1/2 Average Risk   3.4   3.3  Average Risk       5.0   4.4  2 X Average Risk   9.6   7.1  3 X Average Risk  23.4   11.0        Use the calculated Patient Ratio above and the CHD Risk Table to determine the patient's CHD Risk.  ATP III CLASSIFICATION (LDL):  <100     mg/dL   Optimal  201-007  mg/dL   Near or Above                    Optimal  130-159  mg/dL   Borderline  121-975  mg/dL   High  >883     mg/dL   Very High Performed at [facility], [address] [place], [place] [address]   Magnesium     Status: None   Collection Time: 12/10/20  3:00 PM  Result Value Ref Range   Magnesium 1.9 1.7 - 2.4 mg/dL    Comment: Performed at [facility], [address], [place], [place] [address]  CBC     Status: Abnormal   Collection Time: 12/10/20  3:00 PM  Result Value Ref Range   WBC 6.3 4.0 - 10.5 K/uL   RBC 4.54 3.87 - 5.11 MIL/uL   Hemoglobin 10.4 (L) 12.0 - 15.0 g/dL   HCT 83.0 (L) 94.0 - 76.8 %   MCV 75.3 (L) 80.0 - 100.0 fL   MCH 22.9 (L) 26.0 - 34.0 pg   MCHC 30.4 30.0 - 36.0 g/dL   RDW 08.8 (H) 11.0 - 31.5 %    Platelets 298 150 - 400 K/uL   nRBC 0.0 0.0 - 0.2 %    Comment: Performed at [facility], [address], [place], [place] [address]  Comprehensive metabolic panel     Status: Abnormal   Collection Time: 12/10/20  3:00 PM  Result Value Ref Range   Sodium 137 135 - 145 mmol/L   Potassium 3.8 3.5 - 5.1 mmol/L   Chloride 105 98 - 111 mmol/L   CO2 24 22 - 32 mmol/L   Glucose, Bld 126 (H) 70 - 99 mg/dL    Comment: Glucose reference range applies only to samples taken after fasting for at least 8 hours.   BUN 8 6 - 20 mg/dL   Creatinine, Ser 9.29 0.44 - 1.00 mg/dL   Calcium 8.8 (L) 8.9 - 10.3 mg/dL   Total Protein 6.8 6.5 - 8.1 g/dL   Albumin 3.6 3.5 - 5.0 g/dL   AST 35 15 - 41 U/L   ALT 28 0 - 44 U/L   Alkaline Phosphatase 71 38 - 126 U/L   Total Bilirubin 0.5 0.3 - 1.2 mg/dL   GFR, Estimated >24 >46 mL/min    Comment: (NOTE) Calculated using the CKD-EPI Creatinine Equation (2021)    Anion gap 8 5 - 15    Comment: Performed at [facility], [address], [place], [place] [address]  TSH     Status: None   Collection Time: 12/10/20  3:02 PM  Result Value Ref Range   TSH 2.309 0.350 - 4.500 uIU/mL    Comment: Performed by a 3rd Generation assay with a functional sensitivity of <=0.01 uIU/mL. Performed at [facility], [address] [place], [place] [address]      Psychiatric Specialty Exam: Physical Exam  Review of Systems  Weight 274 lb (124.3 kg).There is no height or weight on file to calculate BMI.  General Appearance: NA  Eye Contact:  NA  Speech:  Slow  Volume:  Decreased  Mood:  Depressed and Dysphoric  Affect:  NA  Thought Process:  Goal Directed  Orientation:  Full (Time, Place, and Person)  Thought Content:  Rumination  Suicidal Thoughts:  No  Homicidal Thoughts:  No  Memory:  Immediate;   Fair  Recent;   Fair Remote;   Fair  Judgement:  Fair  Insight:  Shallow  Psychomotor Activity:  NA  Concentration:  Concentration: Fair and Attention Span: Fair   Recall:  Good  Fund of Knowledge:  Good  Language:  Good  Akathisia:  No  Handed:  Right  AIMS (if indicated):     Assets:  Communication Skills Desire for Improvement Housing Resilience Transportation  ADL's:  Intact  Cognition:  WNL  Sleep:   fair      Assessment and Plan: Bipolar disorder type I.  Anxiety.  Cluster B traits.  PTSD  Discussed her psychosocial stressors as husband drinking too much and mother not doing well because mother's husband have a lot of health issues and daughter has not given more time to live.  She is trying to balance her life and there are days that she has not gone to work.  We talk about optimizing lithium to help her mood.  She remembers used to take lithium up to 1500 mg a day but it was reduced by her physician.  Her last blood work shows normal creatinine and BUN.  I recommend she should take at least 450 mg twice a day.  She agreed to give [name] a try.  She currently she is taking 300 twice a day.  We will continue olanzapine 5 mg at bedtime, Prozac 60 mg daily and clonidine 0.2 mg at bedtime.  Encouraged to continue therapy with [name].  Discussed safety concerns and anytime having active suicidal thoughts or homicidal thought then she need to call 911 or go to local emergency room.  We will also do a lithium level but reminded her that she need to take the lithium level 2 weeks at least after starting the higher dose of medication.  She agreed with the plan.  Follow up in 4 weeks.  Follow Up Instructions:    I discussed the assessment and treatment plan with the patient. The patient was provided an opportunity to ask questions and all were answered. The patient agreed with the plan and demonstrated an understanding of the instructions.   The patient was advised to call back or seek an in-person evaluation if the symptoms worsen or if the condition fails to improve as anticipated.  I provided 23 minutes of non-face-to-face time during this  encounter.   Cleotis Nipper, MD

## 2021-02-22 ENCOUNTER — Ambulatory Visit (INDEPENDENT_AMBULATORY_CARE_PROVIDER_SITE_OTHER): Payer: Medicare Other | Admitting: Psychiatry

## 2021-02-22 ENCOUNTER — Other Ambulatory Visit: Payer: Self-pay

## 2021-02-22 ENCOUNTER — Telehealth: Payer: Self-pay | Admitting: Pulmonary Disease

## 2021-02-22 DIAGNOSIS — F319 Bipolar disorder, unspecified: Secondary | ICD-10-CM

## 2021-02-22 DIAGNOSIS — G4733 Obstructive sleep apnea (adult) (pediatric): Secondary | ICD-10-CM | POA: Diagnosis not present

## 2021-02-22 NOTE — Telephone Encounter (Signed)
HST - no significant OSA

## 2021-02-22 NOTE — Progress Notes (Signed)
Virtual Visit via Video Note  I connected with Susan Ball on 02/22/21 at 11:11 AM EST by a video enabled telemedicine application and verified that I am speaking with the correct person using two identifiers.  Location: Patient: Home Provider: [facility] Out[facility] [place] office    I discussed the limitations of evaluation and management by telemedicine and the availability of in person appointments. The patient expressed understanding and agreed to proceed.   I provided 49 minutes of non-face-to-face time during this encounter.   [name], LCSW    THERAPIST PROGRESS NOTE            Session Time: Tuesday  1/312023 11:11 AM -  12:00 PM   Participation Level: Active  Behavioral Response: CasualAlert/euthymic Type of Therapy: Individual Therapy  Treatment Goals addressed: Patient states wanting to stop being so depressed all the time, wanting to work on her attitude/ Alleviate symptoms of depression,resume normal interest in activities and socialization  Interventions: CBT and Supportive  Summary: Susan Ball is a 51 y.o. female who p is referred for services by [name]. She just completed IOP on March 27, 2019. She has long standing history of bipolar disorder and borderline personality disorder. She has had 3 psychiatric hospitalizations. The last was in 2010 at [facility] due to suicide attempt. She participated in outpatient therapy intermittently at [facility] for 3 years. She last was seen there 6 months ago.  Current symptoms include depressed mood, crying spells, mood swings, aggression, anger, excessive worry, sleep difficulty, poor concentration, and memory difficulty.  Patient was seen via virtual visit about two weeks ago.  She reports increased stress, anxiety, and depressed mood since last session.  Per patient's report, husband quit his job.  This triggered thoughts of childhood history of family not having likes, water, and food.  She also reports experiencing  anger and hurt regarding husband's critical comments to patient per her report.  Patient reports becoming overwhelmed and panicked with these thoughts, withdrawing from husband, and taking an overdose of weed but says it did not really do anything.  Per her report, husband rescinded his resignation and has returned to work but continues to look for another job.  Patient continues to fear he may quit and this again triggers spiraling thoughts of family not being able to have necessities.  Patient denies any active suicidal thoughts since last week.  She reports having fleeting thoughts of not wanting to be here yesterday.  She denies any current suicidal ideations or plan or intent to harm self.  Reports she continues to work regularly and attend the gym.  Per patient's report, she did follow-up with Dr. Lolly Mustache and has started an increased dosage of lithium.   Suicidal/Homicidal: No, without plan, without intent  Therapist Response:  reviewed symptoms,  discussed stressors, facilitated expression of thoughts and feelings, validated feelings, and assisted patient examine recent emotional crisis by assisted patient identify emotions/thoughts/behaviors/cage triggers/environment related to the crisis, assisted patient identify her pattern in coping with distressful thoughts and feelings, discussed consequences, began to discuss ways to disrupt pattern including engaging in distracting activities, assisted patient examine what could happen versus what will likely happen to cope with worry thoughts, develop plan with patient to complete worksheets regarding emotional crisis in preparation for next session, also develop plan with patient to resume attendance in aftercare group   Plan: Return again in 2  weeks.  Diagnosis: Axis I: Bipolar Disorder    Axis II: Borderline Personality Dis.  [name], LCSW 02/22/2021

## 2021-02-25 NOTE — Telephone Encounter (Signed)
Spoke with patient  regarding HST results. They verbalized understanding. No further questions.  Nothing further needed at this time.   

## 2021-03-03 DIAGNOSIS — Z9884 Bariatric surgery status: Secondary | ICD-10-CM | POA: Diagnosis not present

## 2021-03-07 DIAGNOSIS — Z79899 Other long term (current) drug therapy: Secondary | ICD-10-CM | POA: Diagnosis not present

## 2021-03-08 ENCOUNTER — Ambulatory Visit (INDEPENDENT_AMBULATORY_CARE_PROVIDER_SITE_OTHER): Payer: Medicare Other | Admitting: Psychiatry

## 2021-03-08 ENCOUNTER — Other Ambulatory Visit: Payer: Self-pay

## 2021-03-08 ENCOUNTER — Telehealth (HOSPITAL_COMMUNITY): Payer: Self-pay | Admitting: *Deleted

## 2021-03-08 DIAGNOSIS — F319 Bipolar disorder, unspecified: Secondary | ICD-10-CM

## 2021-03-08 LAB — LITHIUM LEVEL: Lithium Lvl: 1.2 mmol/L (ref 0.5–1.2)

## 2021-03-08 NOTE — Progress Notes (Signed)
Virtual Visit via Video Note  I connected with Susan [facility] on 03/08/21 at 11:14 AM EST  by a video enabled telemedicine application and verified that I am speaking with the correct person using two identifiers.  Location: Patient: [facility] Provider: [facility] Outpatient [place] office    I discussed the limitations of evaluation and management by telemedicine and the availability of in person appointments. The patient expressed understanding and agreed to proceed.  I provided 41 minutes of non-face-to-face time during this encounter.   [name], LCSW  THERAPIST PROGRESS NOTE            Session Time: Tuesday  03/08/2021 11:14 AM - 11:55 AM   Participation Level: Active  Behavioral Response: CasualAlert/euthymic Type of Therapy: Individual Therapy  Treatment Goals addressed: Patient states wanting to stop being so depressed all the time, wanting to work on her attitude/ Alleviate symptoms of depression,resume normal interest in activities and socialization  Interventions: CBT and Supportive  Summary: Susan Ball is a 51 y.o. female who p is referred for services by [name]. She just completed IOP on March 27, 2019. She has long standing history of bipolar disorder and borderline personality disorder. She has had 3 psychiatric hospitalizations. The last was in 2010 at [facility] due to suicide attempt. She participated in outpatient therapy intermittently at [facility] for 3 years. She last was seen there 6 months ago.  Current symptoms include depressed mood, crying spells, mood swings, aggression, anger, excessive worry, sleep difficulty, poor concentration, and memory difficulty.  Patient was seen via virtual visit about two weeks ago.  She reports increased stress and  anxiety as her husband informed her he submitted his job resignation again and his last day of work is this Friday.  Patient reports becoming very upset with husband and having an argument.  She denies engaging in any  self-injurious behaviors but went for a drive instead to calm self.  She expresses frustration and reports husband has behavioral health issues.  Per patient's report, husband has a pattern of being suspicious of people and thinking others are talking about him.  She reports trying to talk with her husband about his problem.  She reports he cannot be seen at [facility] due to a previous negative incident.  He has not pursued help at another practice as he does not have insurance.  Patient expresses continued fear about their finances but has taken steps to create a safety net.  She also reports husband is looking for another job.  Phone call disconnected near the end of session.  Patient called and left message that she will attend next session.   Suicidal/Homicidal: No, without plan, without intent  Therapist Response:  reviewed symptoms, reviewed treatment plan, obtained patient's permission to electronically sign treatment plan review as this is a virtual visit, discussed stressors, facilitated expression of thoughts and feelings, validated feelings, praised and reinforced patient's efforts to disrupt her pattern in recent emotional crisis, praised and reinforced her efforts to try to use helpful coping strategies, began to assist patient with problem solving   Plan: Return again in 2  weeks.  Diagnosis: Axis I: Bipolar Disorder    Axis II: Borderline Personality Dis.    [name], LCSW 03/08/2021

## 2021-03-08 NOTE — Telephone Encounter (Signed)
Lithium level received from [facility] resulted 03/07/21. Lithium level 1.2 mmo1/L.

## 2021-03-08 NOTE — Plan of Care (Signed)
Pt participated in treatment plan review  Problem: Anger Management Problem 1 - anger/emotional outbursts Goal:  " I want to manage my anger, not jump from 0 to a 100" Outcome: Progressing Goal: LTG: [facility] WILL REDUCE THE AMOUNT OF ANGER-RELATED INCIDENTS/OUTBURST BY 50% for 4 consecutive weeks Outcome: Progressing Goal: STG: Susan Ball will identify situations, thoughts, and feelings that trigger internal anger, angry verbal and/or aggressive behavioral actions as evidenced by a self-recorded report. Outcome: Progressing   Problem: Anger Management Problem 1 - anger/emotional outbursts Intervention: EDUCATE Susan Ball ON "ANGER PAYOFFS" (INTERNAL OR EXTERNAL REINFORCERS OF ANGER RESPONSE) AND THE RATIONALE FOR IDENTIFYING REINFORCERS OF ANGRY RESPONSES Note: continue Intervention: [facility] 10 MINUTES, 2 TIMES PER DAY, FOR 7 DAYS Note: continue Intervention: [facility] 1 TRIGGER THOUGHTS (THOUGHTS THAT TRIGGER AN ANGER RESPONSE) Note: continue Intervention: [facility] ON COPE AHEAD SKILLS TO COPE WITH ANGER AROUSAL Note: continue

## 2021-03-21 ENCOUNTER — Telehealth (HOSPITAL_COMMUNITY): Payer: Medicaid Other | Admitting: Psychiatry

## 2021-03-21 ENCOUNTER — Telehealth (HOSPITAL_BASED_OUTPATIENT_CLINIC_OR_DEPARTMENT_OTHER): Payer: Medicare Other | Admitting: Psychiatry

## 2021-03-21 ENCOUNTER — Encounter (HOSPITAL_COMMUNITY): Payer: Self-pay | Admitting: Psychiatry

## 2021-03-21 ENCOUNTER — Other Ambulatory Visit: Payer: Self-pay

## 2021-03-21 VITALS — Wt 282.0 lb

## 2021-03-21 DIAGNOSIS — F6089 Other specific personality disorders: Secondary | ICD-10-CM | POA: Diagnosis not present

## 2021-03-21 DIAGNOSIS — F319 Bipolar disorder, unspecified: Secondary | ICD-10-CM

## 2021-03-21 DIAGNOSIS — F419 Anxiety disorder, unspecified: Secondary | ICD-10-CM | POA: Diagnosis not present

## 2021-03-21 MED ORDER — VORTIOXETINE HBR 5 MG PO TABS: ORAL_TABLET | ORAL | 0 refills | Status: DC

## 2021-03-21 MED ORDER — OLANZAPINE 5 MG PO TABS: 5.0000 mg | ORAL_TABLET | Freq: Every day | ORAL | 0 refills | Status: DC

## 2021-03-21 MED ORDER — LITHIUM CARBONATE ER 450 MG PO TBCR: EXTENDED_RELEASE_TABLET | ORAL | 0 refills | Status: DC

## 2021-03-21 MED ORDER — CLONIDINE HCL 0.2 MG PO TABS: 0.2000 mg | ORAL_TABLET | Freq: Every day | ORAL | 0 refills | Status: DC

## 2021-03-21 MED ORDER — LITHIUM CARBONATE ER 300 MG PO TBCR: EXTENDED_RELEASE_TABLET | ORAL | 0 refills | Status: DC

## 2021-03-21 NOTE — Progress Notes (Signed)
Virtual Visit via Telephone Note  I connected with Susan Ball on 03/21/21 at  2:40 PM EST by telephone and verified that I am speaking with the correct person using two identifiers.  Location: Patient: Home Provider: Home Office   I discussed the limitations, risks, security and privacy concerns of performing an evaluation and management service by telephone and the availability of in person appointments. I also discussed with the patient that there may be a patient responsible charge related to this service. The patient expressed understanding and agreed to proceed.   History of Present Illness: Patient is evaluated by phone session.  She struggle with depression and anxiety.  We started lithium and dose increased to twice a day.  She noticed does not have manic symptoms or paranoia or hallucination but is still feeling anxious depressed and there are days when she does not want to do anything.  She gained a few pounds because she is not active.  Her husband continues to drink but not as frequent as he used to.  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  Her mother's husband is doing better and she is pleased that it is helping her mother.  Sometimes she gets overwhelmed and too nervous.  Her last lithium level is 1.2.  She has no tremors or shakes or any EPS.  Her sleep is on and off and she still have nightmares and flashback.  She is not involved in any self abusive behavior.  She is in therapy with [name].  She was hoping to reducing olanzapine to help her weight but she actually gained a few pounds more since the dose was reduced from 7.5mg  to 5 mg.  Now she like to go back on BuSpar which helps her anxiety and depression.  She is also taking Prozac 60 mg along with clonidine, lithium and olanzapine.  Past Psychiatric History: H/O Bipolar, anxiety and borderline traits. On meds since age 20.  H/O inpatient, suicidal attempt, overdose, jump from the car and banging head.   Tried Lamictal (rash), Seroquel, Abilify, doxepin, Trazodone, Vraylar and hydroxyzine. H/O n/c with meds, refusing to eat and needed PICC line. H/O anger, road rage, paranoia and trust issues. Had DBT with good response. Genesight test shows Pristiq, Fetzima and Viibryd, lithium, tegretol and Valproic in better column.   Recent Results (from the past 2160 hour(s))  Lithium level     Status: None   Collection Time: 03/07/21 12:13 PM  Result Value Ref Range   Lithium Lvl 1.2 0.5 - 1.2 mmol/L    Comment: A concentration of 0.5-0.8 mmol/L is advised for long-term use; concentrations of up to 1.2 mmol/L may be necessary during acute treatment.                                  Detection Limit = 0.1                           <0.1 indicates None Detected      Psychiatric Specialty Exam: Physical Exam  Review of Systems  Weight 282 lb (127.9 kg).There is no height or weight on file to calculate BMI.  General Appearance: NA  Eye Contact:  NA  Speech:  Slow  Volume:  Decreased  Mood:  Dysphoric  Affect:  NA  Thought Process:  Goal Directed  Orientation:  Full (Time,  Place, and Person)  Thought Content:  Rumination  Suicidal Thoughts:  No  Homicidal Thoughts:  No  Memory:  Immediate;   Fair Recent;   Fair Remote;   Good  Judgement:  Intact  Insight:  Shallow  Psychomotor Activity:  NA  Concentration:  Concentration: Fair and Attention Span: Fair  Recall:  Good  Fund of Knowledge:  Good  Language:  Good  Akathisia:  NA  Handed:  Right  AIMS (if indicated):     Assets:  Communication Skills Desire for Improvement Housing Social Support Transportation  ADL's:  Intact  Cognition:  WNL  Sleep:   fair      Assessment and Plan: Bipolar disorder type I.  Anxiety.  PTSD.  Cluster B traits.  I reviewed blood work results.  Her lithium level is 1.2.  She has no side effects.  I explained rather than trying BuSpar which she had tried before, recommend to try the new medication  Trintellix which she has never tried before.  Currently she is taking Prozac and in the beginning she was resistant to change the Prozac but now she agreed to give her Trintellix to try.  Recommend to cut down Prozac 20 mg daily for 1 week and then discontinue.  She will try Trintellix 5 mg for 1 week and then 10 mg daily.  We will keep the lithium 300 mg twice a day, olanzapine 5 mg daily and clonidine 0.2 mg at bedtime encouraged to continue therapy with Susan Ball.  Encourage exercise and watch her calorie intake.  We will follow up in 4 weeks.   Follow Up Instructions:    I discussed the assessment and treatment plan with the patient. The patient was provided an opportunity to ask questions and all were answered. The patient agreed with the plan and demonstrated an understanding of the instructions.   The patient was advised to call back or seek an in-person evaluation if the symptoms worsen or if the condition fails to improve as anticipated.  I provided 24 minutes of non-face-to-face time during this encounter.   Cleotis Nipper, MD

## 2021-03-22 ENCOUNTER — Other Ambulatory Visit (HOSPITAL_COMMUNITY): Payer: Self-pay | Admitting: *Deleted

## 2021-03-22 ENCOUNTER — Ambulatory Visit (INDEPENDENT_AMBULATORY_CARE_PROVIDER_SITE_OTHER): Payer: Medicare Other | Admitting: Psychiatry

## 2021-03-22 ENCOUNTER — Other Ambulatory Visit: Payer: Self-pay

## 2021-03-22 DIAGNOSIS — F319 Bipolar disorder, unspecified: Secondary | ICD-10-CM

## 2021-03-22 DIAGNOSIS — F419 Anxiety disorder, unspecified: Secondary | ICD-10-CM

## 2021-03-22 MED ORDER — VORTIOXETINE HBR 10 MG PO TABS: 10.0000 mg | ORAL_TABLET | Freq: Every day | ORAL | 0 refills | Status: DC

## 2021-03-22 MED ORDER — VORTIOXETINE HBR 5 MG PO TABS: ORAL_TABLET | ORAL | 0 refills | Status: DC

## 2021-03-22 NOTE — Progress Notes (Signed)
Virtual Visit via Video Note  I connected with Susan Ball on 03/22/21 at 11:10 AM EST by a video enabled telemedicine application and verified that I am speaking with the correct person using two identifiers.  Location: Patient: Home Provider: [facility]    I discussed the limitations of evaluation and management by telemedicine and the availability of in person appointments. The patient expressed understanding and agreed to proceed.  I provided 52  minutes of non-face-to-face time during this encounter.   Susan Salvage, LCSW   THERAPIST PROGRESS NOTE            Session Time: Tuesday  2/28 /2023 11:10 AM -12:02 PM   Participation Level: Active  Behavioral Response: CasualAlert/depressed   Type of Therapy: Individual Therapy  Treatment Goals addressed: Patient states wanting to stop being so depressed all the time, wanting to work on her attitude/ Alleviate symptoms of depression,resume normal interest in activities and socialization  Interventions: CBT and Supportive  Summary: Susan Ball is a 51 y.o. female who p is referred for services by Susan Ball. She just completed IOP on March 27, 2019. She has long standing history of bipolar disorder and borderline personality disorder. She has had 3 psychiatric hospitalizations. The last was in 2010 at [facility] due to suicide attempt. She participated in outpatient therapy intermittently at [facility] for 3 years. She last was seen there 6 months ago.  Current symptoms include depressed mood, crying spells, mood swings, aggression, anger, excessive worry, sleep difficulty, poor concentration, and memory difficulty.  Patient was seen via virtual visit about two weeks ago.  She states feeling more depressed and expresses frustration about medication.  She reports seeing Susan Ball yesterday and will start taking a different medication.  She denies SI and any 7 years behaviors.  She reports continued stress and  anxiety as her husband has quit his job.  She expresses frustration and worry.  Per her report, she is smoking 3-4 blunts per day to try to cope.  She does not use on the days she works.  She continues to attend the gym but reports being less motivated and less enthusiastic about her workouts.  She reports additional stress regarding Susan Ball being in the process of moving back to [facility].  Per patient's report, Susan Ball and her Susan Ball get together and try to tell her what to do.    Suicidal/Homicidal: No, without plan, without intent  Therapist Response:  reviewed symptoms, discussed stressors, facilitated expression of thoughts and feelings, validated feelings, discussed patient talking with Susan Ball about use of marijuana and effects on medication, reviewed role of medication compliance and encouraged patient to continue working with psychiatrist Susan Ball, assisted patient identify ways to use assertiveness skills to set and maintain limits with Susan Ball, did role-play, discussed rationale for and assisted patient practice beach visualization to cope with stress and anxiety, developed plan with patient to practice beach visualization once per day, checked out audio exercise to patient and sent code, also will send a copy via mail, also discussed other relaxation techniques including doing things in nature such as going to the park, praised and reinforced patient's efforts to maintain involvement in activities including going to the gym and work, encouraged patient to resume attendance in the aftercare group,   Plan: Return again in 2  weeks.  Diagnosis: Axis I: Bipolar Disorder    Axis II: Borderline Personality Dis.    Susan Salvage, LCSW 03/22/2021

## 2021-03-23 ENCOUNTER — Telehealth (HOSPITAL_COMMUNITY): Payer: Self-pay | Admitting: *Deleted

## 2021-03-23 NOTE — Telephone Encounter (Signed)
Writer spoke with pt who called to say that she picked up Lithium script and realized it was for 300 mg bid. Pt states that she is supposed to be taking 450 mg bid?  Per your most recent note it states pt will continue 300 mg bid. Writer advised pt of this but she wants to double check with you. Please review and advise.  ?

## 2021-03-24 NOTE — Telephone Encounter (Signed)
Yes she supposed to take lithium 300 mg twice daily. We started Trintellix with cross titration with Prozac.  ?

## 2021-03-24 NOTE — Telephone Encounter (Signed)
I spoke with pt to inform.

## 2021-04-05 ENCOUNTER — Other Ambulatory Visit: Payer: Self-pay

## 2021-04-05 ENCOUNTER — Ambulatory Visit (HOSPITAL_COMMUNITY): Payer: Medicare Other | Admitting: Psychiatry

## 2021-04-12 ENCOUNTER — Telehealth (HOSPITAL_COMMUNITY): Payer: Medicare Other | Admitting: Psychiatry

## 2021-04-19 ENCOUNTER — Ambulatory Visit (INDEPENDENT_AMBULATORY_CARE_PROVIDER_SITE_OTHER): Payer: Medicare Other | Admitting: Psychiatry

## 2021-04-19 ENCOUNTER — Other Ambulatory Visit: Payer: Self-pay

## 2021-04-19 DIAGNOSIS — R8271 Bacteriuria: Secondary | ICD-10-CM | POA: Diagnosis not present

## 2021-04-19 DIAGNOSIS — N3020 Other chronic cystitis without hematuria: Secondary | ICD-10-CM | POA: Diagnosis not present

## 2021-04-19 DIAGNOSIS — F319 Bipolar disorder, unspecified: Secondary | ICD-10-CM | POA: Diagnosis not present

## 2021-04-19 NOTE — Plan of Care (Signed)
Pt participated in treatment plan review ?Problem: Anger Management Problem 1 - anger/emotional outbursts ?Goal:  " I want to manage my anger, not jump from 0 to a 100" ?Outcome: Progressing ?Goal: LTG: Susan Ball WILL REDUCE THE AMOUNT OF ANGER-RELATED INCIDENTS/OUTBURST BY 50% for 4 consecutive weeks ?Outcome: Progressing ?Goal: STG: Susan Ball will identify situations, thoughts, and feelings that trigger internal anger, angry verbal and/or aggressive behavioral actions as evidenced by a self-recorded report. ?Outcome: Progressing ?  ?Problem: Anger Management Problem 1 - anger/emotional outbursts ?Intervention: Susan [facility] ON "ANGER PAYOFFS" (INTERNAL OR EXTERNAL REINFORCERS OF ANGER RESPONSE) AND THE RATIONALE FOR IDENTIFYING REINFORCERS OF ANGRY RESPONSES ?Note: Continue ?Intervention: Susan Ball ON COPE AHEAD SKILLS TO COPE WITH ANGER AROUSAL ?Note: continue ?  ?

## 2021-04-19 NOTE — Progress Notes (Signed)
Virtual Visit via Video Note ? ?I connected with Susan Ball on 04/19/21 at 11:10 AM EDT  by a video enabled telemedicine application and verified that I am speaking with the correct person using two identifiers. ? ?Location: ?Patient: Home ?Provider: [facility] Outpatient [place] office  ?  ?I discussed the limitations of evaluation and management by telemedicine and the availability of in person appointments. The patient expressed understanding and agreed to proceed. ? ? ?I provided 45 minutes of non-face-to-face time during this encounter. ? ? ?Shaniqwa Horsman E Kalicia Dufresne, LCSW ? ? ? ?THERAPIST PROGRESS NOTE ? ?          ?Session Time: Tuesday  04/19/2021 11:10 AM - 11:55 AM  ? ?Participation Level: Active ? ?Behavioral Response: CasualAlert/depressed  ? ?Type of Therapy: Individual Therapy ? ?Treatment Goals addressed: Susan Ball will reduce the amount of anger related incident/outburst by 50% for 4 consecutive weeks/Susan Ball will identify situations, thoughts, and feelings that trigger internal anger, angry verbal and or aggressive behavioral actions as evidenced by self recorded report ? ?Progress on Goals: Progressing  ? ?Interventions: CBT and Supportive ? ?Summary: Susan Ball is a 51 y.o. female who p is referred for services by [name]. She just completed IOP on March 27, 2019. She has long standing history of bipolar disorder and borderline personality disorder. She has had 3 psychiatric hospitalizations. The last was in 2010 at [facility] due to suicide attempt. She participated in outpatient therapy intermittently at [facility] for 3 years. She last was seen there 6 months ago.  Current symptoms include depressed mood, crying spells, mood swings, aggression, anger, excessive worry, sleep difficulty, poor concentration, and memory difficulty. ? ?Patient was seen via virtual visit about 4 weeks ago.  She improved mood, decreased crying spells, decreased irritability, and decreased emotional outbursts since last  session.  Per patient's report she initially was having significant difficulty with medication changes.  She reports she now is taking Trintellix, Prozac, and lithium.  She discontinued taking olanzapine due to experiencing increased symptoms of depression.  She missed last appointment with Dr. Lolly Mustache but agrees to notify psychiatrist Dr. Lolly Mustache and discuss her concerns about the medication.  She has an appointment in 2 weeks.  Patient reports decreased marijuana use saying she only smokes 1 blunt per day now and sometimes will go 2 or 3 days without a blunt.  She also reports decreased financial stress as husband has obtained a job and will begin work within a week or 2.  She reports continued stress regarding sister but recently successfully setting and maintaining limits with sister and her mother using assertiveness skills rather than being aggressive.  Patient reports she has resumed attendance in the aftercare group.  Patient reports using the beach visualization once but then forgetting to practice.  She reports PMR seems to be more helpful. ? ? Suicidal/Homicidal: No, without plan, without intent ? ?Therapist Response:  reviewed symptoms, discussed stressors, facilitated expression of thoughts and feelings, validated feelings, discussed patient talking with Dr. Lolly Mustache about use of marijuana and effects on medication, reviewed role of medication compliance and encouraged patient to continue working with psychiatrist Dr. Lolly Mustache, praised and reinforced patient's efforts to use assertiveness skills, discussed effects, reviewed treatment plan, obtained patient's permission to electronically signed plan as this was a virtual visit, reviewed rationale for practicing relaxation techniques daily, develop plan with patient to practice PMR, developed plan with patient to keep anger log and bring to next session, praised and reinforced patient's attendance in the  aftercare group ? ? plan: Return again in 2   weeks. ? ?Diagnosis: Axis I: Bipolar Disorder ? ?  Axis II: Borderline Personality Dis. ? ?Collaboration of Care: Psychiatrist AEB patient working with psychiatrist Dr. Lolly Mustache, clinician encouraging patient to follow-up with psychiatrist ? ?Patient/Guardian was advised Release of Information must be obtained prior to any record release in order to collaborate their care with an outside provider. Patient/Guardian was advised if they have not already done so to contact the registration department to sign all necessary forms in order for [name] to release information regarding their care.  ? ?Consent: Patient/Guardian gives verbal consent for treatment and assignment of benefits for services provided during this visit. Patient/Guardian expressed understanding and agreed to proceed.  ? ?Starlena Beil Debe Coder, LCSW ?04/19/2021 ? ? ? ? ? ? ? ? ? ? ?

## 2021-04-29 ENCOUNTER — Other Ambulatory Visit (HOSPITAL_COMMUNITY): Payer: Self-pay | Admitting: Psychiatry

## 2021-05-02 ENCOUNTER — Other Ambulatory Visit (HOSPITAL_COMMUNITY): Payer: Self-pay | Admitting: *Deleted

## 2021-05-02 MED ORDER — VORTIOXETINE HBR 10 MG PO TABS: 10.0000 mg | ORAL_TABLET | Freq: Every day | ORAL | 0 refills | Status: DC

## 2021-05-03 ENCOUNTER — Ambulatory Visit (HOSPITAL_COMMUNITY): Payer: Medicare Other | Admitting: Psychiatry

## 2021-05-10 ENCOUNTER — Encounter (HOSPITAL_COMMUNITY): Payer: Self-pay | Admitting: Psychiatry

## 2021-05-10 ENCOUNTER — Telehealth (HOSPITAL_BASED_OUTPATIENT_CLINIC_OR_DEPARTMENT_OTHER): Payer: Medicare Other | Admitting: Psychiatry

## 2021-05-10 VITALS — Wt 268.0 lb

## 2021-05-10 DIAGNOSIS — F319 Bipolar disorder, unspecified: Secondary | ICD-10-CM

## 2021-05-10 DIAGNOSIS — F6089 Other specific personality disorders: Secondary | ICD-10-CM

## 2021-05-10 DIAGNOSIS — F419 Anxiety disorder, unspecified: Secondary | ICD-10-CM | POA: Diagnosis not present

## 2021-05-10 DIAGNOSIS — F4310 Post-traumatic stress disorder, unspecified: Secondary | ICD-10-CM

## 2021-05-10 MED ORDER — FLUOXETINE HCL 20 MG PO CAPS: 20.0000 mg | ORAL_CAPSULE | Freq: Every day | ORAL | 1 refills | Status: DC

## 2021-05-10 MED ORDER — LITHIUM CARBONATE ER 300 MG PO TBCR: EXTENDED_RELEASE_TABLET | ORAL | 1 refills | Status: DC

## 2021-05-10 MED ORDER — VORTIOXETINE HBR 20 MG PO TABS: 20.0000 mg | ORAL_TABLET | Freq: Every day | ORAL | 1 refills | Status: DC

## 2021-05-10 NOTE — Progress Notes (Signed)
Virtual Visit via Video Note ? ?I connected with Susan Ball on 05/10/21 at  8:20 AM EDT by a video enabled telemedicine application and verified that I am speaking with the correct person using two identifiers. ? ?Location: ?Patient: Home ?Provider: Home Office ?  ?I discussed the limitations of evaluation and management by telemedicine and the availability of in person appointments. The patient expressed understanding and agreed to proceed. ? ?History of Present Illness: ?Patient is evaluated by video session.  She stopped taking olanzapine because of the weight gain.  She had lost more than 10 pounds since she stopped the olanzapine.  We started her on Trintellix and she like the new medication.  She noticed less crying spells and less anxiety.  She is more motivated to do things.  She started going to gym every day.  She reported her husband also noticed improvement in her mood irritability and anger and anxiety.  She sleeps good and denies any recent nightmares or flashbacks.  She is still taking Prozac 40 mg which was recommended to stop.  She is afraid stopping Prozac may bring her symptoms back.  She is taking lithium 300 mg twice a day.  She is in therapy with [name].  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  She denies any paranoia, hallucination or anger.  She is not taking clonidine and her sleep is better.  She is not involved in any self harming behavior. ? ?Past Psychiatric History: ?H/O Bipolar, anxiety and borderline traits. On meds since age 34.  H/O inpatient, suicidal attempt, overdose, jump from the car and banging head.  Tried Lamictal (rash), Seroquel, Abilify, doxepin, Trazodone, Vraylar and hydroxyzine. H/O n/c with meds, refusing to eat and needed PICC line. H/O anger, road rage, paranoia and trust issues. Had DBT with good response. Genesight test shows Pristiq, Fetzima and Viibryd, lithium, tegretol and Valproic in  better column.  ? ?Psychiatric Specialty Exam: ?Physical Exam  ?Review of Systems  ?Weight 268 lb (121.6 kg).There is no height or weight on file to calculate BMI.  ?General Appearance: Casual  ?Eye Contact:  Good  ?Speech:  Normal Rate  ?Volume:  Normal  ?Mood:  Anxious  ?Affect:  Congruent  ?Thought Process:  Goal Directed  ?Orientation:  Full (Time, Place, and Person)  ?Thought Content:  Logical  ?Suicidal Thoughts:  No  ?Homicidal Thoughts:  No  ?Memory:  Immediate;   Good ?Recent;   Good ?Remote;   Fair  ?Judgement:  Intact  ?Insight:  Shallow  ?Psychomotor Activity:  Normal  ?Concentration:  Concentration: Fair and Attention Span: Fair  ?Recall:  Good  ?Fund of Knowledge:  Fair  ?Language:  Good  ?Akathisia:  No  ?Handed:  Right  ?AIMS (if indicated):     ?Assets:  Communication Skills ?Desire for Improvement ?Housing ?Social Support ?Transportation  ?ADL's:  Intact  ?Cognition:  WNL  ?Sleep:   better  ? ? ? ? ?Assessment and Plan: ?Bipolar disorder type I.  Anxiety.  PTSD.  Cluster B traits. ? ?Patient is still taking Prozac 40 mg despite she was told to discontinue.  She is not taking olanzapine because of the weight gain and she had dropped more than 10 pounds.  She feels more active and started going to gym.  I recommend to try Trintellix 20 mg and reduce Prozac 20 mg.  We discussed taking 2 antidepressant may cause tremors shakes.  At this time patient agreed to cut down the Prozac 20 mg and eventually in the future we will take her off from Prozac and keep the Trintellix.  She noticed improvement with the Trintellix.  She is no longer taking clonidine and sleeping better.  We will continue lithium 300 mg twice a day.  Discussed medication side effects and benefits.  Encouraged to continue therapy with [name].  We will follow up in 2 months. ? ?Follow Up Instructions: ? ?  ?I discussed the assessment and treatment plan with the patient. The patient was provided an opportunity to ask questions and all  were answered. The patient agreed with the plan and demonstrated an understanding of the instructions. ?  ?The patient was advised to call back or seek an in-person evaluation if the symptoms worsen or if the condition fails to improve as anticipated. ? ?Collaboration of Care: Other provider involved in patient's care AEB notes are in epic to review.  ? ?Patient/Guardian was advised Release of Information must be obtained prior to any record release in order to collaborate their care with an outside provider. Patient/Guardian was advised if they have not already done so to contact the registration department to sign all necessary forms in order for [name] to release information regarding their care.  ? ?Consent: Patient/Guardian gives verbal consent for treatment and assignment of benefits for services provided during this visit. Patient/Guardian expressed understanding and agreed to proceed.   ? ?I provided 27 minutes of non-face-to-face time during this encounter. ? ? ?Cleotis Nipper, MD  ?

## 2021-05-17 ENCOUNTER — Ambulatory Visit (INDEPENDENT_AMBULATORY_CARE_PROVIDER_SITE_OTHER): Payer: Medicare Other | Admitting: Psychiatry

## 2021-05-17 DIAGNOSIS — F319 Bipolar disorder, unspecified: Secondary | ICD-10-CM | POA: Diagnosis not present

## 2021-05-17 NOTE — Progress Notes (Signed)
Virtual Visit via Video Note ? ?I connected with Susan Ball on 05/17/21 at 11:14 AM EDT  by a video enabled telemedicine application and verified that I am speaking with the correct person using two identifiers. ? ?Location: ?Patient: Home  ?Provider: [facility] [place] office  ?  ?I discussed the limitations of evaluation and management by telemedicine and the availability of in person appointments. The patient expressed understanding and agreed to proceed. ? ? ?I provided 44 minutes of non-face-to-face time during this encounter. ? ? ?Susan Ball E Daphnie Venturini, LCSW ? ? ?THERAPIST PROGRESS NOTE ? ?          ?Session Time: Tuesday  05/17/2021 11:14 AM - 11:58 AM  ? ?Participation Level: Active ? ?Behavioral Response: CasualAlert/depressed  ? ?Type of Therapy: Individual Therapy ? ?Treatment Goals addressed: Susan Ball will reduce the amount of anger related incident/outburst by 50% for 4 consecutive weeks/Susan Ball will identify situations, thoughts, and feelings that trigger internal anger, angry verbal and or aggressive behavioral actions as evidenced by self recorded report ? ?Progress on Goals: Progressing  ? ?Interventions: CBT and Supportive ? ?Summary: Susan Ball is a 51 y.o. female who p is referred for services by Dellia Nims. She just completed IOP on March 27, 2019. She has long standing history of bipolar disorder and borderline personality disorder. She has had 3 psychiatric hospitalizations. The last was in 2010 at [facility] due to suicide attempt. She participated in outpatient therapy intermittently at [facility] for 3 years. She last was seen there 6 months ago.  Current symptoms include depressed mood, crying spells, mood swings, aggression, anger, excessive worry, sleep difficulty, poor concentration, and memory difficulty. ? ?Patient was seen via virtual visit about 4 weeks ago.  She reports improved mood overall but continuing to experience anger outbursts and aggressive behaviors.  Per patient's  report, she experienced increased stress regarding husband deciding to quit his job twice since last session.  She is pleased that she did not resort to any self harmful behavior.  She also is pleased she " stood up for self" and expressed her concerns to her husband but admits she used aggressive instead of assertive communication.  She is pleased husband now is pursuing therapy and medication management for his behavioral health issues.  Patient reports 1 instance of road rage since last session.  Patient expresses appropriate sadness about the death of a close friend 2 weeks ago.  Since friend's death, patient reports experiencing increased thoughts about self, her spirituality, and her purpose.  She expresses frustration as she reports depression, anxiety, and difficulty regulating emotions cause her to have negative thoughts about trying to fulfill her purpose.  ? ? Suicidal/Homicidal: No, without plan, without intent ? ?Therapist Response:  reviewed symptoms, discussed stressors, facilitated expression of thoughts and feelings, validated feelings, praised and reinforced patient's efforts to disrupt her unhelpful pattern and responding to husband's decisions to quit his job, discussed effects of using aggressive communication versus assertive communication, assisted patient began to identify her values and generate reasons to to reduce aggressive behaviors, processed grief and loss issues regarding the death of her friend, validated and normalized feelings related to grief and loss  ? ? plan: Return again in 2  weeks. ? ?Diagnosis: Axis I: Bipolar Disorder ? ?  Axis II: Borderline Personality Dis. ? ?Collaboration of Care: Psychiatrist AEB patient seeing psychiatrist Dr. Adele Schilder, clinician reviewing chart. ? ?Patient/Guardian was advised Release of Information must be obtained prior to any record release in order to  collaborate their care with an outside provider. Patient/Guardian was advised if they have not  already done so to contact the registration department to sign all necessary forms in order for [name] to release information regarding their care.  ? ?Consent: Patient/Guardian gives verbal consent for treatment and assignment of benefits for services provided during this visit. Patient/Guardian expressed understanding and agreed to proceed.  ? ?[place], LCSW ?05/17/2021 ? ? ? ? ? ? ? ? ? ? ? ?

## 2021-06-03 ENCOUNTER — Ambulatory Visit (HOSPITAL_COMMUNITY): Payer: Medicare Other | Admitting: Psychiatry

## 2021-06-17 ENCOUNTER — Ambulatory Visit (INDEPENDENT_AMBULATORY_CARE_PROVIDER_SITE_OTHER): Payer: Medicare Other | Admitting: Psychiatry

## 2021-06-17 DIAGNOSIS — F319 Bipolar disorder, unspecified: Secondary | ICD-10-CM

## 2021-06-17 NOTE — Progress Notes (Signed)
Virtual Visit via Video Note  I connected with Susan Ball on 06/17/21 at 9:06 AM EDT by a video enabled telemedicine application and verified that I am speaking with the correct person using two identifiers.  Location: Patient: Home Provider: [place] office    I discussed the limitations of evaluation and management by telemedicine and the availability of in person appointments. The patient expressed understanding and agreed to proceed.   I provided 45 minutes of non-face-to-face time during this encounter.   Alonza Smoker, LCSW   THERAPIST PROGRESS NOTE            Session Time: Friday 06/17/2021 9:06 AM - 9:51 AM   Participation Level: Active  Behavioral Response: CasualAlert/depressed   Type of Therapy: Individual Therapy  Treatment Goals addressed: Susan Ball will reduce the amount of anger related incident/outburst by 50% for 4 consecutive weeks/Susan Ball will identify situations, thoughts, and feelings that trigger internal anger, angry verbal and or aggressive behavioral actions as evidenced by self recorded report  Progress on Goals: Progressing   Interventions: CBT and Supportive  Summary: Susan Ball is a 51 y.o. female who p is referred for services by Dellia Nims. She just completed IOP on March 27, 2019. She has long standing history of bipolar disorder and borderline personality disorder. She has had 3 psychiatric hospitalizations. The last was in 2010 at [facility] due to suicide attempt. She participated in outpatient therapy intermittently at [facility] for 3 years. She last was seen there 6 months ago.  Current symptoms include depressed mood, crying spells, mood swings, aggression, anger, excessive worry, sleep difficulty, poor concentration, and memory difficulty.  Patient was seen via virtual visit about 4 weeks ago.  She reports stable mood since last session.  She says she has had 1-2 anger outbursts related to road rage since last session.   She expresses some relief her husband has found another job.  She expresses frustration he is not seeking behavioral health services as he had problems but she reports just trying not to deal with this right now.  She reports combination of Prozac, Trintellix, and lithium seems to be working very well.  She reports decreased marijuana use to 2 joints per day.  She reports increased stress regarding interaction with her mother and sister.  Per patient's report.  Both of them can be controlling and demanding.  Patient reports difficulty being assertive and setting and maintaining limits with each of them.  Patient reports continuing to attend work as well as workout at the gym regularly.   Suicidal/Homicidal: No, without plan, without intent  Therapist Response:  reviewed symptoms, discussed stressors, facilitated expression of thoughts and feelings, validated feelings, praised and reinforced patient's efforts to maintain involvement in activity, assisted patient examine her pattern of interaction with her sister and mother, began to assist patient examine her thought patterns regarding this interaction, assisted patient identify ways to i improve assertive communication with sister regarding an upcoming trip patient does not want to attend with sister, discussed possible script and did role-play, will send patient handouts basic personal rights and ways to improve assertive communication in preparation for next session.   plan: Return again in 2 weeks.  Diagnosis: Axis I: Bipolar Disorder    Axis II: Borderline Personality Dis.  Collaboration of Care: Psychiatrist AEB patient seeing psychiatrist Dr. Adele Schilder, clinician reviewing chart.  Patient/Guardian was advised Release of Information must be obtained prior to any record release in order to collaborate their care with an  outside provider. Patient/Guardian was advised if they have not already done so to contact the registration department to sign all  necessary forms in order for [name] to release information regarding their care.   Consent: Patient/Guardian gives verbal consent for treatment and assignment of benefits for services provided during this visit. Patient/Guardian expressed understanding and agreed to proceed.   Alonza Smoker, LCSW 06/17/2021

## 2021-07-01 ENCOUNTER — Ambulatory Visit (INDEPENDENT_AMBULATORY_CARE_PROVIDER_SITE_OTHER): Payer: Medicare Other | Admitting: Psychiatry

## 2021-07-01 DIAGNOSIS — F319 Bipolar disorder, unspecified: Secondary | ICD-10-CM | POA: Diagnosis not present

## 2021-07-01 NOTE — Progress Notes (Signed)
Virtual Visit via Video Note  I connected with Susan Ball on 07/01/21 at 9:06 AM EDT  by a video enabled telemedicine application and verified that I am speaking with the correct person using two identifiers.  Location: Patient: Home  Provider: [facility] [place] office    I discussed the limitations of evaluation and management by telemedicine and the availability of in person appointments. The patient expressed understanding and agreed to proceed.   I provided 45 minutes of non-face-to-face time during this encounter.   [name], LCSW    THERAPIST PROGRESS NOTE            Session Time: Friday 06/17/2021 9:06 AM - 9:51 AM   Participation Level: Active  Behavioral Response: CasualAlert/depressed/anxious  Type of Therapy: Individual Therapy  Treatment Goals addressed: Susan Ball will reduce the amount of anger related incident/outburst by 50% for 4 consecutive weeks/Susan Ball will identify situations, thoughts, and feelings that trigger internal anger, angry verbal and or aggressive behavioral actions as evidenced by self recorded report  Progress on Goals: Progressing   Interventions: CBT and Supportive  Summary: Susan Ball is a 51 y.o. female who p is referred for services by [name]. She just completed IOP on March 27, 2019. She has long standing history of bipolar disorder and borderline personality disorder. She has had 3 psychiatric hospitalizations. The last was in 2010 at [facility] due to suicide attempt. She participated in outpatient therapy intermittently at [facility] for 3 years. She last was seen there 6 months ago.  Current symptoms include depressed mood, crying spells, mood swings, aggression, anger, excessive worry, sleep difficulty, poor concentration, and memory difficulty.  Patient was seen via virtual visit about 2 weeks ago.  She reports increased irritability since last session.  She reports being easily agitated with everyone.  She reports  poor sleep pattern and states difficulty falling and staying asleep.  She reports sleeping about 4 hours per night for the past month.  She maintains medication compliance per her report.  She continues to attend work and go to [employer]  She reports continued stress regarding the relation the relationship with her mother.  However, she is most stressed now about her husband who is working but also has stated he is now thinking about quitting his job.  Patient expresses frustration as this has been a recurrent pattern with husband for the past couple of years.  She worries about income flow as well as the effects on their future.  She also reports husband has paranoia.  She expresses sadness as husband did not exhibit this type of behavior during the first 3 years of her marriage.     Suicidal/Homicidal: No, without plan, without intent  Therapist Response:  reviewed symptoms, discussed stressors, facilitated expression of thoughts and feelings, validated feelings, assisted patient identify triggers of increased irritability including worry and sleep deprivation, assisted patient identify realistic expectations of husband versus unrealistic demands, discussed other possible resources for husband regarding behavioral health issues, assisted patient identify ways to schedule time for self to engage in pleasurable activities to reduce stress and anxiety as well as improve self-care, developed plan with patient to engage in a pleasurable activity 1 time per week    plan: Return again in 2 weeks.  Diagnosis: Axis I: Bipolar Disorder    Axis II: Borderline Personality Dis.  Collaboration of Care: Psychiatrist AEB patient seeing psychiatrist Dr. Lolly Mustache, clinician reviewing chart.  Patient/Guardian was advised Release of Information must be obtained prior  to any record release in order to collaborate their care with an outside provider. Patient/Guardian was advised if they have not already done so to contact  the registration department to sign all necessary forms in order for [name] to release information regarding their care.   Consent: Patient/Guardian gives verbal consent for treatment and assignment of benefits for services provided during this visit. Patient/Guardian expressed understanding and agreed to proceed.   [name], LCSW 07/01/2021

## 2021-07-06 ENCOUNTER — Other Ambulatory Visit (HOSPITAL_COMMUNITY): Payer: Self-pay | Admitting: *Deleted

## 2021-07-06 DIAGNOSIS — F319 Bipolar disorder, unspecified: Secondary | ICD-10-CM

## 2021-07-06 DIAGNOSIS — F6089 Other specific personality disorders: Secondary | ICD-10-CM

## 2021-07-11 ENCOUNTER — Encounter (HOSPITAL_COMMUNITY): Payer: Self-pay | Admitting: Psychiatry

## 2021-07-11 ENCOUNTER — Telehealth (HOSPITAL_BASED_OUTPATIENT_CLINIC_OR_DEPARTMENT_OTHER): Payer: Medicare Other | Admitting: Psychiatry

## 2021-07-11 DIAGNOSIS — F419 Anxiety disorder, unspecified: Secondary | ICD-10-CM | POA: Diagnosis not present

## 2021-07-11 DIAGNOSIS — F6089 Other specific personality disorders: Secondary | ICD-10-CM

## 2021-07-11 DIAGNOSIS — F319 Bipolar disorder, unspecified: Secondary | ICD-10-CM

## 2021-07-11 MED ORDER — CLONIDINE HCL 0.2 MG PO TABS: 0.2000 mg | ORAL_TABLET | Freq: Every day | ORAL | 0 refills | Status: DC

## 2021-07-11 MED ORDER — VORTIOXETINE HBR 20 MG PO TABS: 20.0000 mg | ORAL_TABLET | Freq: Every day | ORAL | 0 refills | Status: DC

## 2021-07-11 MED ORDER — LITHIUM CARBONATE ER 300 MG PO TBCR: EXTENDED_RELEASE_TABLET | ORAL | 2 refills | Status: DC

## 2021-07-11 MED ORDER — FLUOXETINE HCL 20 MG PO CAPS: 20.0000 mg | ORAL_CAPSULE | Freq: Every day | ORAL | 0 refills | Status: DC

## 2021-07-11 NOTE — Progress Notes (Signed)
Virtual Visit via Telephone Note  I connected with Susan Ball on 07/11/21 at 10:40 AM EDT by telephone and verified that I am speaking with the correct person using two identifiers.  Location: Patient: [place] in [place] Provider: Home Office   I discussed the limitations, risks, security and privacy concerns of performing an evaluation and management service by telephone and the availability of in person appointments. I also discussed with the patient that there may be a patient responsible charge related to this service. The patient expressed understanding and agreed to proceed.   History of Present Illness: Patient is evaluated by phone session.  She is in [place] visiting her husband's friends mother who has 80th birthday.  She is doing well.  Her sleep is improved.  Patient told she is taking clonidine and now her stopped and that helped her sleep, nightmares and flashback.  She still feels some time her sleep is not enough as sleeping less some nights.  She is keeping her work which is 16 hours a week.  She really liked the Trintellix and like to keep the Prozac 20 mg and Trintellix and together it is working very well.  She denies any highs and lows, mania, anger, agitation.  She denies any crying spells or any feeling of hopelessness.  She is in therapy with [name].  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  She is not involved in any self abusive behavior.  She is glad that she lost few pounds since the last visit.  She started going [facility] [employer] and does 2 hours of workout few days a week.  She has no tremors, shakes or any EPS.  She denies any impulsive behavior.  She reported getting much better relationship with her husband.  Past Psychiatric History: H/O Bipolar, anxiety and borderline traits. On meds since age 20.  H/O inpatient, suicidal attempt, overdose, jump from the car and banging head.   Tried Lamictal (rash), Seroquel, Abilify, doxepin, Trazodone, Vraylar and hydroxyzine. H/O n/c with meds, refusing to eat and needed PICC line. H/O anger, road rage, paranoia and trust issues. Had DBT with good response. Genesight test shows Pristiq, Fetzima and Viibryd, lithium, tegretol and Valproic in better column.   Psychiatric Specialty Exam: Physical Exam  Review of Systems  Weight 263 lb (119.3 kg).Body mass index is 43.77 kg/m.  General Appearance: NA  Eye Contact:  NA  Speech:  Normal Rate  Volume:  Normal  Mood:  Euthymic  Affect:  NA  Thought Process:  Goal Directed  Orientation:  Full (Time, Place, and Person)  Thought Content:  Logical  Suicidal Thoughts:  No  Homicidal Thoughts:  No  Memory:  Immediate;   Good Recent;   Good Remote;   Fair  Judgement:  Intact  Insight:  Present  Psychomotor Activity:  NA  Concentration:  Concentration: Fair and Attention Span: Fair  Recall:  [employer] of Knowledge:  Good  Language:  Good  Akathisia:  No  Handed:  Right  AIMS (if indicated):     Assets:  Communication Skills Desire for Improvement Housing Social Support Transportation  ADL's:  Intact  Cognition:  WNL  Sleep:   better      Assessment and Plan: Bipolar disorder type I.  Cluster B traits.  PTSD.  Patient doing better with the combination of Prozac 20 mg and Trintellix  20 mg.  She is very reluctant to cut down the Prozac and she also taking clonidine which she recalled never stopped.  She reported it helps her nightmares.  Her last lithium level was 1.2.  She denies any tremors, shakes or any EPS.  We may consider cutting down the Prozac in the future if needed.  For now continue Prozac 20 mg daily, Trintellix 20 mg daily, lithium 300 mg twice a day and clonidine 0.2 mg at bedtime.  Encouraged to keep appointment with [name].  Recommended to call us back if she has any question or any concern.  We will move her appointment every 3 months.  Follow Up  Instructions:    I discussed the assessment and treatment plan with the patient. The patient was provided an opportunity to ask questions and all were answered. The patient agreed with the plan and demonstrated an understanding of the instructions.   The patient was advised to call back or seek an in-person evaluation if the symptoms worsen or if the condition fails to improve as anticipated.  Collaboration of Care: Primary Care Provider AEB  notes are available in epic to review.  Patient/Guardian was advised Release of Information must be obtained prior to any record release in order to collaborate their care with an outside provider. Patient/Guardian was advised if they have not already done so to contact the registration department to sign all necessary forms in order for [name] to release information regarding their care.   Consent: Patient/Guardian gives verbal consent for treatment and assignment of benefits for services provided during this visit. Patient/Guardian expressed understanding and agreed to proceed.    I provided 20 minutes of non-face-to-face time during this encounter.   Cleotis Nipper, MD

## 2021-07-15 ENCOUNTER — Ambulatory Visit (INDEPENDENT_AMBULATORY_CARE_PROVIDER_SITE_OTHER): Payer: Medicare Other | Admitting: Psychiatry

## 2021-07-15 DIAGNOSIS — F319 Bipolar disorder, unspecified: Secondary | ICD-10-CM | POA: Diagnosis not present

## 2021-07-29 ENCOUNTER — Ambulatory Visit (HOSPITAL_COMMUNITY): Payer: Medicare Other | Admitting: Psychiatry

## 2021-08-12 ENCOUNTER — Ambulatory Visit (INDEPENDENT_AMBULATORY_CARE_PROVIDER_SITE_OTHER): Payer: Medicare Other | Admitting: Psychiatry

## 2021-08-12 DIAGNOSIS — F319 Bipolar disorder, unspecified: Secondary | ICD-10-CM | POA: Diagnosis not present

## 2021-08-12 NOTE — Progress Notes (Signed)
Virtual Visit via Video Note  I connected with [name] on 08/12/21 at 9:08 AM  by a video enabled telemedicine application and verified that I am speaking with the correct person using two identifiers.  Location: Patient: Home Provider:  [facility] [place] office    I discussed the limitations of evaluation and management by telemedicine and the availability of in person appointments. The patient expressed understanding and agreed to proceed.  I provided 52 minutes of non-face-to-face time during this encounter.   [name], LCSW  THERAPIST PROGRESS NOTE            Session Time: Friday 08/12/2021 9:08 AM - 10:00 AM   Participation Level: Active  Behavioral Response: CasualAlert/depressed/anxious  Type of Therapy: Individual Therapy  Treatment Goals addressed: Susan Ball will reduce the amount of anger related incident/outburst by 50% for 4 consecutive weeks/[name] will identify situations, thoughts, and feelings that trigger internal anger, angry verbal and or aggressive behavioral actions as evidenced by self recorded report  Progress on Goals: Progressing   Interventions: CBT and Supportive  Summary: Susan Ball is a 51 y.o. female who p is referred for services by [name]. She just completed IOP on March 27, 2019. She has long standing history of bipolar disorder and borderline personality disorder. She has had 3 psychiatric hospitalizations. The last was in 2010 at [facility] due to suicide attempt. She participated in outpatient therapy intermittently at [facility] for 3 years. She last was seen there 6 months ago.  Current symptoms include depressed mood, crying spells, mood swings, aggression, anger, excessive worry, sleep difficulty, poor concentration, and memory difficulty.  Patient was seen via virtual visit about 4 weeks ago.  She states doing better since last session.  She reports expressing her concerns and thoughts to her husband about the  possibility of husband quitting job.  She reports being very angry and aggressive initially but then eventually being honest with her husband about her fears.  She reports they had a second conversation that was much more productive.  Patient reports being more aware of her emotions and physical responses.  She reports using self talk to calm self during their second conversation.  She reports feeling very vulnerable and still having for years about sharing her feelings.  However, she reports she and her husband now understand each other better and are experiencing improvement in the relationship.    Suicidal/Homicidal: No, without plan, without intent  Therapist Response:  reviewed symptoms, discussed stressors, facilitated expression of thoughts and feelings, validated feelings, praised and reinforced patient's efforts to express concerns and feelings to her husband, discussed effects, assisted patient identify benefits, praised and reinforced patient's increased awareness and use of self talk to calm self, provided psychoeducation on distress tolerance, assisted patient identify her values and improving skills to tolerate distress, assisted patient identify effects of trauma history on her current functioning in managing stress,  provided psychoeducation on anxiety and the stress response, assisted patient practice a body scan meditation to release tension    plan: Return again in 2 weeks.  Diagnosis: Axis I: Bipolar Disorder    Axis II: Borderline Personality Dis.  Collaboration of Care: Psychiatrist AEB patient seeing psychiatrist Dr. Lolly Mustache, clinician reviewing chart.  Patient/Guardian was advised Release of Information must be obtained prior to any record release in order to collaborate their care with an outside provider. Patient/Guardian was advised if they have not already done so to contact the registration department to sign all necessary forms  in order for [name] to release information  regarding their care.   Consent: Patient/Guardian gives verbal consent for treatment and assignment of benefits for services provided during this visit. Patient/Guardian expressed understanding and agreed to proceed.   [name], LCSW 08/12/2021

## 2021-08-22 ENCOUNTER — Ambulatory Visit (INDEPENDENT_AMBULATORY_CARE_PROVIDER_SITE_OTHER): Payer: Medicare Other | Admitting: Psychiatry

## 2021-08-22 DIAGNOSIS — F319 Bipolar disorder, unspecified: Secondary | ICD-10-CM | POA: Diagnosis not present

## 2021-08-22 NOTE — Progress Notes (Signed)
Virtual Visit via Video Note  I connected with [name] on 08/22/21 at 10:06 AM  by a video enabled telemedicine application and verified that I am speaking with the correct person using two identifiers.  Location: Patient: Home Provider: [facility] [place] office    I discussed the limitations of evaluation and management by telemedicine and the availability of in person appointments. The patient expressed understanding and agreed to proceed.   I provided 31 minutes of non-face-to-face time during this encounter.   [name], LCSW  THERAPIST PROGRESS NOTE            Session Time: Monday  08/22/2021 10:06 AM - 10:37 AM   Participation Level: Active  Behavioral Response: CasualAlert/depressed/anxious  Type of Therapy: Individual Therapy  Treatment Goals addressed: [name] will reduce the amount of anger related incident/outburst by 50% for 4 consecutive weeks/Clotilda will identify situations, thoughts, and feelings that trigger internal anger, angry verbal and or aggressive behavioral actions as evidenced by self recorded report  Progress on Goals: Progressing   Interventions: CBT and Supportive  Summary: HENLEE DONOVAN is a 51 y.o. female who p is referred for services by [name]. She just completed IOP on March 27, 2019. She has long standing history of bipolar disorder and borderline personality disorder. She has had 3 psychiatric hospitalizations. The last was in 2010 at [facility] due to suicide attempt. She participated in outpatient therapy intermittently at [facility] for 3 years. She last was seen there 6 months ago.  Current symptoms include depressed mood, crying spells, mood swings, aggression, anger, excessive worry, sleep difficulty, poor concentration, and memory difficulty.  Patient was seen via virtual visit about 2 weeks ago.  She reports increased symptoms of depression including poor motivation, depressed mood, decreased interest/pleasure in  activities along with increased worry since last session.  Per patient's report, triggers husband's decision to quit his job.  She expresses frustration, disappointment, and hurt.  Patient continues to work.  She also continues to attend the gym.  However, she reports little to no involvement in activity outside of this.  Per her report, she will just sit on the couch for hours.  She states not really wanting to talk about the situation today.  She reports not having complete confidentiality today as husband is at home.  She reports she has increased use of weed.  Patient reports she has tried to avoid confrontation with her husband.  Suicidal/Homicidal: No, without plan, without intent  Therapist Response:  reviewed symptoms, discussed stressors, facilitated expression of thoughts and feelings, validated feelings, assisted patient identify ways to increase behavioral activation, discussed getting off couch at least once per hour and finding small tasks to perform, also discussed going out for drive/window shopping, praised and reinforced patient's continued efforts to attend gym    plan: Return again in 2 weeks.  Diagnosis: Axis I: Bipolar Disorder    Axis II: Borderline Personality Dis.  Collaboration of Care: Psychiatrist AEB patient seeing psychiatrist Dr. Lolly Mustache, clinician reviewing chart.  Patient/Guardian was advised Release of Information must be obtained prior to any record release in order to collaborate their care with an outside provider. Patient/Guardian was advised if they have not already done so to contact the registration department to sign all necessary forms in order for [name] to release information regarding their care.   Consent: Patient/Guardian gives verbal consent for treatment and assignment of benefits for services provided during this visit. Patient/Guardian expressed understanding and agreed to proceed.  [name], LCSW 08/22/2021

## 2021-08-23 ENCOUNTER — Ambulatory Visit (INDEPENDENT_AMBULATORY_CARE_PROVIDER_SITE_OTHER): Payer: Medicare Other | Admitting: Orthopedic Surgery

## 2021-08-23 ENCOUNTER — Encounter: Payer: Self-pay | Admitting: Orthopedic Surgery

## 2021-08-23 VITALS — Ht 65.0 in | Wt 263.0 lb

## 2021-08-23 DIAGNOSIS — G8929 Other chronic pain: Secondary | ICD-10-CM | POA: Diagnosis not present

## 2021-08-23 DIAGNOSIS — M25561 Pain in right knee: Secondary | ICD-10-CM

## 2021-08-23 NOTE — Patient Instructions (Signed)

## 2021-08-24 ENCOUNTER — Encounter: Payer: Self-pay | Admitting: Orthopedic Surgery

## 2021-08-24 NOTE — Progress Notes (Signed)
Return patient Visit  Assessment: Susan Ball is a 51 y.o. female with the following: 1. Acute pain of right knee  Plan: [name] had excellent relief of pain in her right knee following a steroid injection several months ago.  She would like to proceed with another injection today.    This was completed today without issue.  Once again, we discussed the possibility of proceeding with advanced imaging if she does not have sustained relief with her follow-up injection.  Follow-up as needed.  Procedure note injection Right knee joint   Verbal consent was obtained to inject the right knee joint  Timeout was completed to confirm the site of injection.  The skin was prepped with alcohol and ethyl chloride was sprayed at the injection site.  A 21-gauge needle was used to inject 40 mg of Depo-Medrol and 1% lidocaine (3 cc) into the right knee using an anterolateral approach.  There were no complications. A sterile bandage was applied.    Follow-up: Return if symptoms worsen or fail to improve.  Subjective:  Chief Complaint  Patient presents with   Knee Pain    Rt knee pain back for 1 mo.     History of Present Illness: Susan Ball is a 51 y.o. female who has returns to clinic today for right knee pain.  I last saw her in clinic approximately 7 months ago.  At that time, her right knee was injected.  She has done well.  She had pain until approximately 1 month ago.  No recent injury.  Medications have not provided sufficient relief.  Her health is remained stable.  Review of Systems: No fevers or chills No numbness or tingling No chest pain No shortness of breath No bowel or bladder dysfunction No GI distress No headaches    Objective: Ht 5\' 5"  (1.651 m)   Wt 263 lb (119.3 kg)   BMI 43.77 kg/m   Physical Exam:  General: Alert and oriented. and No acute distress. Gait: Normal gait.  Right knee without effusion.  Tenderness to palpation along the  lateral joint line.  No increased laxity to varus or valgus stress.  She is able to achieve full extension.  She tolerates flexion beyond 120 degrees.  Negative Lachman.  IMAGING: I personally ordered and reviewed the following images  No new imaging obtained today.   New Medications:  No orders of the defined types were placed in this encounter.     , MD  08/24/2021 12:18 PM

## 2021-09-05 ENCOUNTER — Ambulatory Visit (INDEPENDENT_AMBULATORY_CARE_PROVIDER_SITE_OTHER): Payer: Medicare Other | Admitting: Psychiatry

## 2021-09-05 DIAGNOSIS — F319 Bipolar disorder, unspecified: Secondary | ICD-10-CM
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
## 2021-09-12 ENCOUNTER — Telehealth (INDEPENDENT_AMBULATORY_CARE_PROVIDER_SITE_OTHER): Payer: Medicare Other | Admitting: Psychiatry

## 2021-09-12 DIAGNOSIS — F319 Bipolar disorder, unspecified: Secondary | ICD-10-CM

## 2021-09-12 DIAGNOSIS — Z1231 Encounter for screening mammogram for malignant neoplasm of breast: Secondary | ICD-10-CM | POA: Diagnosis not present

## 2021-09-12 NOTE — Progress Notes (Signed)
Virtual Visit via Video Note  I connected with Susan Ball on 09/12/21 at 10:03 AM EDT  by a video enabled telemedicine application and verified that I am speaking with the correct person using two identifiers.  Location: Patient: Home Provider: [facility] [place] office    I discussed the limitations of evaluation and management by telemedicine and the availability of in person appointments. The patient expressed understanding and agreed to proceed.   I provided 33 minutes of non-face-to-face time during this encounter.   Susan Salvage, LCSW  THERAPIST PROGRESS NOTE            Session Time: Monday 09/12/2021 10:03 AM - 10:36 AM   Participation Level: Active  Behavioral Response: CasualAlert/depressed/anxious  Type of Therapy: Individual Therapy  Treatment Goals addressed: [name] will reduce the amount of anger related incident/outburst by 50% for 4 consecutive weeks/Shantal will identify situations, thoughts, and feelings that trigger internal anger, angry verbal and or aggressive behavioral actions as evidenced by self recorded report  Progress on Goals: Progressing   Interventions: CBT and Supportive  Summary: Susan Ball is a 51 y.o. female who  is referred for services by [name]. She just completed IOP on March 27, 2019. She has long standing history of bipolar disorder and borderline personality disorder. She has had 3 psychiatric hospitalizations. The last was in 2010 at [facility] due to suicide attempt. She participated in outpatient therapy intermittently at [facility] for 3 years. She last was seen there 6 months ago.  Current symptoms include depressed mood, crying spells, mood swings, aggression, anger, excessive worry, sleep difficulty, poor concentration, and memory difficulty.  Patient was seen via virtual visit about a week ago.  She reports improved mood and behavioral activation since last session.  Per her report, she began to perform light  household tasks such as vacuuming and washing dishes a couple of days after last session.  However, her stepfather died this past 05-17-2022.  Patient reports being with him when he died.  She expresses appropriate sadness but is not overwhelmed by this.  She expresses gratitude for the time she had with him and the positive interactions they have prior to his death.  She also has been relying on her spirituality.  She has been staying with her mother overnight since stepfather's death.  She reports improved assertive communication with her sister and reports sitting as well as maintaining limits.  She also used assertiveness skills to request help from her sister.  She reports improvement in the relationship with her husband as he has been very supportive and understanding.  She also is pleased husband has gotten a job and will start working Wednesday.  Patient is pleased with her progress and efforts.  She also is pleased she has only used weed once since last session.   Suicidal/Homicidal: No, without plan, without intent.  She agrees to call call 911, [phone] or have someone take her to the ED should symptoms worsen  Therapist Response:  reviewed symptoms, discussed stressors, facilitated expression of thoughts and feelings, validated feelings and normalized feelings related to grief and loss, praised and reinforced patient's increased behavioral activation/use of assertiveness skills, discussed effects of use of assertiveness skills on her relationships/her mood/her behavior, encouraged patient to continue self-care efforts   plan: Return again in 2 weeks.  Diagnosis: Axis I: Bipolar Disorder    Axis II: Borderline Personality Dis.  Collaboration of Care: Psychiatrist AEB patient seeing psychiatrist Dr. Lolly Mustache, clinician reviewing chart.  Patient/Guardian  was advised Release of Information must be obtained prior to any record release in order to collaborate their care with an outside provider.  Patient/Guardian was advised if they have not already done so to contact the registration department to sign all necessary forms in order for [name] to release information regarding their care.   Consent: Patient/Guardian gives verbal consent for treatment and assignment of benefits for services provided during this visit. Patient/Guardian expressed understanding and agreed to proceed.   Susan Salvage, LCSW 09/12/2021

## 2021-09-19 ENCOUNTER — Ambulatory Visit (INDEPENDENT_AMBULATORY_CARE_PROVIDER_SITE_OTHER): Payer: Medicare Other | Admitting: Psychiatry

## 2021-09-19 DIAGNOSIS — F319 Bipolar disorder, unspecified: Secondary | ICD-10-CM | POA: Diagnosis not present

## 2021-09-19 DIAGNOSIS — F603 Borderline personality disorder: Secondary | ICD-10-CM | POA: Diagnosis not present

## 2021-09-19 NOTE — Progress Notes (Signed)
Virtual Visit via Video Note  I connected with [name] on 09/19/21 at 10:05 AM EDT by a video enabled telemedicine application and verified that I am speaking with the correct person using two identifiers.  Location: Patient: Home Provider: [facility] [place] office    I discussed the limitations of evaluation and management by telemedicine and the availability of in person appointments. The patient expressed understanding and agreed to proceed.  I provided 47 minutes of non-face-to-face time during this encounter.   [name], LCSW    THERAPIST PROGRESS NOTE            Session Time: Monday 09/19/2021 10:05 AM  - 10:52 AM   Participation Level: Active  Behavioral Response: CasualAlert/depressed/anxious/tearful  Type of Therapy: Individual Therapy  Treatment Goals addressed: [name] will reduce the amount of anger related incident/outburst by 50% for 4 consecutive weeks/Brucha will identify situations, thoughts, and feelings that trigger internal anger, angry verbal and or aggressive behavioral actions as evidenced by self recorded report  Progress on Goals: Progressing   Interventions: CBT and Supportive  Summary: LITICIA GASIOR is a 51 y.o. female who  is referred for services by [name]. She just completed IOP on March 27, 2019. She has long standing history of bipolar disorder and borderline personality disorder. She has had 3 psychiatric hospitalizations. The last was in 2010 at [facility] due to suicide attempt. She participated in outpatient therapy intermittently at [facility] for 3 years. She last was seen there 6 months ago.  Current symptoms include depressed mood, crying spells, mood swings, aggression, anger, excessive worry, sleep difficulty, poor concentration, and memory difficulty.  Patient was seen via virtual visit about a week ago.  She reports increased stress and mood swings since last session. She identifies conflict with husband as well  as with her sister as triggers. She expresses frustration as husband has quit his job. He does have an interview today for another job. Patient states being fine 1 minute and depressed the next.  She reports irritability and increased aggressive behavior citing an incident at the gym as well as aggressive driving.  Patient reports she still is attending the gym and working.  She also is still having contact with her mother.  Suicidal/Homicidal: No, without plan, without intent.  She agrees to call call 911, [phone] or have someone take her to the ED should symptoms worsen  Therapist Response:  reviewed symptoms, discussed stressors, facilitated expression of thoughts and feelings, validated feelings, assisted patient examine her thought patterns and effects on her mood and behavior, assisted patient identify realistic expectations of self and husband, assisted patient identify ways to use spirituality to identify helpful coping statements, also developed plan with patient to schedule consistent regular time for self to nurture her spirituality, discussed using a daily gratitude list, also encouraged patient to follow through to see psychiatrist Dr. Lolly Mustache for medication management   plan: Return again in 2 weeks.  Diagnosis: Axis I: Bipolar Disorder    Axis II: Borderline Personality Dis.  Collaboration of Care: Psychiatrist AEB patient seeing psychiatrist Dr. Lolly Mustache, clinician reviewing chart.  Patient/Guardian was advised Release of Information must be obtained prior to any record release in order to collaborate their care with an outside provider. Patient/Guardian was advised if they have not already done so to contact the registration department to sign all necessary forms in order for [name] to release information regarding their care.   Consent: Patient/Guardian gives verbal consent for treatment and assignment  of benefits for services provided during this visit. Patient/Guardian expressed  understanding and agreed to proceed.   [name], LCSW 09/19/2021

## 2021-10-06 ENCOUNTER — Telehealth (HOSPITAL_COMMUNITY): Payer: Medicare Other | Admitting: Psychiatry

## 2021-10-07 ENCOUNTER — Telehealth (HOSPITAL_BASED_OUTPATIENT_CLINIC_OR_DEPARTMENT_OTHER): Payer: Medicare Other | Admitting: Psychiatry

## 2021-10-07 ENCOUNTER — Encounter (HOSPITAL_COMMUNITY): Payer: Self-pay | Admitting: Psychiatry

## 2021-10-07 ENCOUNTER — Ambulatory Visit (INDEPENDENT_AMBULATORY_CARE_PROVIDER_SITE_OTHER): Payer: Medicare Other | Admitting: Psychiatry

## 2021-10-07 DIAGNOSIS — F6089 Other specific personality disorders: Secondary | ICD-10-CM

## 2021-10-07 DIAGNOSIS — F419 Anxiety disorder, unspecified: Secondary | ICD-10-CM

## 2021-10-07 DIAGNOSIS — F319 Bipolar disorder, unspecified: Secondary | ICD-10-CM

## 2021-10-07 MED ORDER — FLUOXETINE HCL 20 MG PO CAPS: 20.0000 mg | ORAL_CAPSULE | Freq: Every day | ORAL | 0 refills | Status: DC

## 2021-10-07 MED ORDER — CLONIDINE HCL 0.2 MG PO TABS: 0.2000 mg | ORAL_TABLET | Freq: Every day | ORAL | 0 refills | Status: DC

## 2021-10-07 MED ORDER — VORTIOXETINE HBR 20 MG PO TABS: 20.0000 mg | ORAL_TABLET | Freq: Every day | ORAL | 0 refills | Status: DC

## 2021-10-07 MED ORDER — LITHIUM CARBONATE ER 300 MG PO TBCR: EXTENDED_RELEASE_TABLET | ORAL | 2 refills | Status: DC

## 2021-10-07 NOTE — Progress Notes (Signed)
Virtual Visit via Video Note  I connected with Susan Ball on 10/07/21 at 9:06 AM EDT by a video enabled telemedicine application and verified that I am speaking with the correct person using two identifiers.  Location: Patient: Home Provider: [facility] [place] [facility]    I discussed the limitations of evaluation and management by telemedicine and the availability of in person appointments. The patient expressed understanding and agreed to proceed. I provided 50 minutes of non-face-to-face time during this encounter.   [name], LCSW    THERAPIST PROGRESS NOTE            Session Time: Friday 10/07/2021 9:06 AM - 9:56 AM   Participation Level: Active  Behavioral Response: CasualAlert/depressed/anxious/tearful  Type of Therapy: Individual Therapy  Treatment Goals addressed: Susan Ball will reduce the amount of anger related incident/outburst by 50% for 4 consecutive weeks/Susan Ball will identify situations, thoughts, and feelings that trigger internal anger, angry verbal and or aggressive behavioral actions as evidenced by self recorded report  Progress on Goals: Progressing   Interventions: CBT and Supportive  Summary: Susan Ball is a 51 y.o. female who  is referred for services by [name]. She just completed IOP on March 27, 2019. She has long standing history of bipolar disorder and borderline personality disorder. She has had 3 psychiatric hospitalizations. The last was in 2010 at [facility] due to suicide attempt. She participated in outpatient therapy intermittently at [facility] for 3 years. She last was seen there 6 months ago.  Current symptoms include depressed mood, crying spells, mood swings, aggression, anger, excessive worry, sleep difficulty, poor concentration, and memory difficulty.  Patient was seen via virtual visit about 2-3 weeks ago.  She reports implementing plan developed last session and doing well for the first 2 days after last session.  She  then reports becoming more depressed and says this was triggered by negative comments from her sister about patient being in therapy.  She also reports another trigger is  husband being dissatisfied with his new job and already stating that he is planning to quit.  Patient reports she became overwhelmed and did not want to deal with anyone or anything.  She has continued to go to work.  She reports she has increased marijuana to daily use.  She reports increased road rage since last session but denies having any emotional outbursts with family.  She reports having passive SI last week with no plan or intent to harm self.  She reports being stressed today as she is planning to go on a family trip in honor of her mother's birthday but does not want to go due to possible questions from her family members about her life.     Suicidal/Homicidal: No, without plan, without intent.  She agrees to call call 911, [phone] or have someone take her to the ED should symptoms worsen  Therapist Response:  reviewed symptoms, discussed stressors, facilitated expression of thoughts and feelings, validated feelings, assisted patient identify ways to improve assertiveness skills to set and maintain limits with family regarding family trip, assisted patient identify her thoughts/feelings/and body sensations when feeling overwhelmed or angry, provided psychoeducation on the mechanics of the threat system and response, discussed rationale for and assisted patient practice body scan to release muscle tension, developed plan with patient to practice body scan, encouraged patient to follow through with appointment to see psychiatrist Dr. Lolly Mustache    plan: Return again in 2 weeks.  Diagnosis: Axis I: Bipolar Disorder  Axis II: Borderline Personality Dis.  Collaboration of Care: Psychiatrist AEB patient seeing psychiatrist Dr. Lolly Mustache, clinician reviewing chart.  Patient/Guardian was advised Release of Information must be obtained  prior to any record release in order to collaborate their care with an outside provider. Patient/Guardian was advised if they have not already done so to contact the registration department to sign all necessary forms in order for [name] to release information regarding their care.   Consent: Patient/Guardian gives verbal consent for treatment and assignment of benefits for services provided during this visit. Patient/Guardian expressed understanding and agreed to proceed.   [name], LCSW 10/07/2021
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
## 2021-10-20 ENCOUNTER — Ambulatory Visit
Admission: EM | Admit: 2021-10-20 | Discharge: 2021-10-20 | Disposition: A | Discharge status: Home / Self Care | Payer: Medicare Other | Attending: Family Medicine | Admitting: Family Medicine

## 2021-10-20 ENCOUNTER — Telehealth: Payer: Self-pay | Admitting: Emergency Medicine

## 2021-10-20 ENCOUNTER — Encounter: Payer: Self-pay | Admitting: Emergency Medicine

## 2021-10-20 ENCOUNTER — Other Ambulatory Visit: Payer: Self-pay

## 2021-10-20 DIAGNOSIS — S29019A Strain of muscle and tendon of unspecified wall of thorax, initial encounter: Secondary | ICD-10-CM | POA: Diagnosis not present

## 2021-10-20 MED ORDER — NAPROXEN 500 MG PO TABS: 500.0000 mg | ORAL_TABLET | Freq: Two times a day (BID) | ORAL | 0 refills | Status: DC | PRN

## 2021-10-20 MED ORDER — TIZANIDINE HCL 4 MG PO CAPS: 4.0000 mg | ORAL_CAPSULE | Freq: Three times a day (TID) | ORAL | 0 refills | Status: DC | PRN

## 2021-10-20 NOTE — Telephone Encounter (Signed)
Pt called and reported muscle relaxer and naproxen were not covered by insurance. Pt reports forgot to mention at visit that pt is unable to take naproxen and that muscle relaxer is not covered by insurance.   Consulted PA and reported to discuss with pt the option of using good rx or check with pharmacy regarding discounts on medication as well as take ibuprofen as needed.pt verbalized understanding.

## 2021-10-20 NOTE — ED Triage Notes (Signed)
Pt reports left upper back pain/shoulder pain since lifting someone yesterday. Pt reports pain increases with movement and radiates to bilateral arms.

## 2021-10-20 NOTE — ED Provider Notes (Signed)
[facility]    CSN: [id number] Arrival date & time: 10/20/21  1516      History   Chief Complaint Chief Complaint  Patient presents with   Shoulder Pain    HPI Susan Ball is a 51 y.o. female.   Patient presenting today with left periscapular pain and mid back pain after lifting something heavy yesterday.  States movement seems to lock up the area worse.  Denies radiation of pain down the arm or leg, numbness, tingling, weakness, abdominal or chest pain.  So far not trying anything over-the-counter for symptoms.    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      Home Medications    Prior to Admission medications   Medication Sig Start Date End Date Taking? Authorizing Provider  naproxen (NAPROSYN) 500 MG tablet Take 1 tablet (500 mg total) by mouth 2 (two) times daily as needed. 10/20/21  Yes Particia Nearing, PA-C  tiZANidine (ZANAFLEX) 4 MG capsule Take 1 capsule (4 mg total) by mouth 3 (three) times daily as needed for muscle spasms. Do not drink alcohol or drive  while taking this medication.  May cause drowsiness. 10/20/21  Yes Particia Nearing, PA-C  albuterol (VENTOLIN HFA) 108 (90 Base) MCG/ACT inhaler SMARTSIG:2 Puff(s) By Mouth 4 Times Daily PRN 11/03/20   [provider]  calcium carbonate (OS-CAL - DOSED IN MG OF ELEMENTAL CALCIUM) 1250 (500 Ca) MG tablet Take 1 tablet by mouth daily with breakfast.     [provider]  Cholecalciferol 25 MCG (1000 UT) capsule Take 1,000 Units by mouth daily.     [provider]  cloNIDine (CATAPRES) 0.2 MG tablet Take 1 tablet (0.2 mg total) by mouth at bedtime. 10/07/21 10/07/22  [name], Phillips Grout, MD  Cyanocobalamin (B-12) 5000 MCG SUBL See admin instructions.    [provider]  Ferrous Sulfate (SLOW FE) 142 (45 Fe) MG TBCR 1 tablet 11/03/20   [provider]  FLUoxetine (PROZAC) 20 MG capsule Take 1 capsule (20 mg total) by mouth daily. 10/07/21 10/07/22  Cleotis Nipper, MD  fluticasone Aleda Grana) 50 MCG/ACT nasal spray 2 sprays 05/30/10   [provider]  lithium carbonate (LITHOBID) 300 MG CR tablet Take one tab (300 mg total) twice a day. 10/07/21   [name], Phillips Grout, MD  OLANZapine (ZYPREXA) 5 MG tablet Take 1 tablet (5 mg total) by mouth at bedtime. Patient not taking: Reported on 05/10/2021 03/21/21 03/21/22  Cleotis Nipper, MD  pantoprazole (PROTONIX) 40 MG tablet SMARTSIG:1 Tablet(s) By Mouth Morning-Evening 04/30/21   [provider]  solifenacin (VESICARE) 5 MG tablet Take 5 mg by mouth daily. 09/21/20   [provider]  Trimethoprim HCl (TRIMPEX PO) Take by mouth.    [provider]  vortioxetine HBr (TRINTELLIX) 20 MG TABS tablet Take 1 tablet (20 mg total) by mouth daily. 10/07/21   [name], Phillips Grout, MD    Family History Family History  Problem Relation Age of Onset   Depression Sister    Depression Brother     Social History Social History   Tobacco Use   Smoking status: Never   Smokeless tobacco: Never  Vaping Use    Vaping Use: Never used  Substance Use Topics   Alcohol use: Not Currently   Drug use: Not Currently    Types: Marijuana    Comment: States she stopped smoking THC in 2002     Allergies   Fexofenadine and Lamictal [lamotrigine]   Review of Systems Review of Systems Per HPI  Physical Exam Triage Vital Signs ED Triage Vitals [10/20/21 1600]  Enc Vitals Group     BP 118/79     Pulse Rate (!) 52     Resp 20     Temp 98.1 F (36.7 C)     Temp Source Oral     SpO2 97 %     Weight      Height      Head Circumference      Peak Flow      Pain Score 7     Pain Loc      Pain Edu?      Excl. in GC?    No data found.  Updated Vital Signs BP 118/79 (BP Location: Right Arm)   Pulse (!) 52   Temp 98.1 F (36.7 C) (Oral)   Resp 20   LMP 10/02/2021 (Approximate)   SpO2 97%   Visual Acuity Right Eye Distance:   Left Eye Distance:   Bilateral Distance:    Right Eye Near:   Left Eye Near:    Bilateral Near:     Physical Exam Vitals and nursing note reviewed.  Constitutional:      Appearance: Normal appearance. She is not ill-appearing.  HENT:     Head: Atraumatic.  Eyes:     Extraocular Movements: Extraocular movements intact.     Conjunctiva/sclera: Conjunctivae normal.  Cardiovascular:     Rate and Rhythm: Normal rate and regular rhythm.     Heart sounds: Normal heart sounds.  Pulmonary:     Effort: Pulmonary effort is normal.     Breath sounds: Normal breath sounds.  Musculoskeletal:        General: Tenderness present. No swelling or deformity. Normal range of motion.     Cervical back: Normal range of motion and neck supple.     Comments: No midline spinal tenderness to palpation diffusely.  Focal tenderness to the borders of the scapular region on the left.  Range of motion full and intact  Skin:    General: Skin is warm and dry.  Neurological:     Mental Status: She is alert and oriented to person, place, and time.  Psychiatric:        Mood and Affect:  Mood normal.        Thought Content: Thought content normal.        Judgment: Judgment normal.      UC Treatments / Results  Labs (all labs ordered are listed, but only abnormal results are displayed) Labs Reviewed -  No data to display  EKG   Radiology No results found.  Procedures Procedures (including critical care time)  Medications Ordered in UC Medications - No data to display  Initial Impression / Assessment and Plan / UC Course  I have reviewed the triage vital signs and the nursing notes.  Pertinent labs & imaging results that were available during my care of the patient were reviewed by me and considered in my medical decision making (see chart for details).     Consistent with thoracic strain, treat with naproxen, Zanaflex, heat, massage, stretches.  Return for worsening symptoms.  Final Clinical Impressions(s) / UC Diagnoses   Final diagnoses:  Thoracic myofascial strain, initial encounter   Discharge Instructions   None    ED Prescriptions     Medication Sig Dispense Auth. Provider   naproxen (NAPROSYN) 500 MG tablet Take 1 tablet (500 mg total) by mouth 2 (two) times daily as needed. 30 tablet Volney American, [place]   tiZANidine (ZANAFLEX) 4 MG capsule Take 1 capsule (4 mg total) by mouth 3 (three) times daily as needed for muscle spasms. Do not drink alcohol or drive while taking this medication.  May cause drowsiness. 15 capsule Volney American, [place]      PDMP not reviewed this encounter.   Volney American, [place] 10/20/21 1630

## 2021-10-21 ENCOUNTER — Telehealth: Payer: Self-pay | Admitting: Nurse Practitioner

## 2021-10-21 ENCOUNTER — Ambulatory Visit (INDEPENDENT_AMBULATORY_CARE_PROVIDER_SITE_OTHER): Payer: Medicare Other | Admitting: Psychiatry

## 2021-10-21 DIAGNOSIS — F319 Bipolar disorder, unspecified: Secondary | ICD-10-CM

## 2021-10-21 MED ORDER — TIZANIDINE HCL 4 MG PO TABS: 4.0000 mg | ORAL_TABLET | Freq: Three times a day (TID) | ORAL | 0 refills | Status: DC | PRN

## 2021-10-21 NOTE — Telephone Encounter (Signed)
Sending tizanidine tablets instead of capsules; more affordable for patient
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
## 2021-10-27 DIAGNOSIS — M25512 Pain in left shoulder: Secondary | ICD-10-CM | POA: Diagnosis not present

## 2021-10-27 DIAGNOSIS — Z23 Encounter for immunization: Secondary | ICD-10-CM | POA: Diagnosis not present

## 2021-11-04 ENCOUNTER — Encounter (HOSPITAL_COMMUNITY): Payer: Self-pay

## 2021-11-04 ENCOUNTER — Ambulatory Visit (INDEPENDENT_AMBULATORY_CARE_PROVIDER_SITE_OTHER): Payer: Medicare Other | Admitting: Psychiatry

## 2021-11-04 DIAGNOSIS — F319 Bipolar disorder, unspecified: Secondary | ICD-10-CM | POA: Diagnosis not present
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
## 2021-11-04 NOTE — Plan of Care (Signed)
Patient participated in treatment plan update.

## 2021-11-25 DIAGNOSIS — K5900 Constipation, unspecified: Secondary | ICD-10-CM | POA: Diagnosis not present

## 2021-11-29 ENCOUNTER — Ambulatory Visit (HOSPITAL_COMMUNITY): Payer: Medicare Other | Admitting: Psychiatry

## 2021-11-29 ENCOUNTER — Telehealth (HOSPITAL_COMMUNITY): Payer: Self-pay | Admitting: Psychiatry

## 2021-11-29 ENCOUNTER — Encounter (HOSPITAL_COMMUNITY): Payer: Self-pay

## 2021-11-29 NOTE — Telephone Encounter (Signed)
Therapist attempted to contact patient via caregility platform for scheduled appointment, no response.  Therapist called patient, left message indicating attempt, and requesting patient call office.

## 2021-12-13 ENCOUNTER — Ambulatory Visit (INDEPENDENT_AMBULATORY_CARE_PROVIDER_SITE_OTHER): Payer: Medicare Other | Admitting: Psychiatry

## 2021-12-13 DIAGNOSIS — F6089 Other specific personality disorders: Secondary | ICD-10-CM | POA: Diagnosis not present

## 2021-12-13 DIAGNOSIS — F319 Bipolar disorder, unspecified: Secondary | ICD-10-CM

## 2021-12-13 DIAGNOSIS — F4310 Post-traumatic stress disorder, unspecified: Secondary | ICD-10-CM | POA: Diagnosis not present

## 2021-12-13 NOTE — Progress Notes (Signed)
Virtual Visit via Video Note  I connected with Susan Ball on 12/13/21 at 9:09 AM EST  by a video enabled telemedicine application and verified that I am speaking with the correct person using two identifiers.  Location: Patient: Home Provider: [facility] [place] office    I discussed the limitations of evaluation and management by telemedicine and the availability of in person appointments. The patient expressed understanding and agreed to proceed.  I provided 48 minutes of non-face-to-face time during this encounter.   Susan Salvage, LCSW     THERAPIST PROGRESS NOTE            Session Time: Tuesday 12/13/2021 9:09 AM - 9:57 AM   Participation Level: Active  Behavioral Response: CasualAlert/depressed/anxious/tearful  Type of Therapy: Individual Therapy  Treatment Goals addressed: Susan Ball will reduce the amount of anger related incident/outburst by 50% for 4 consecutive weeks/Susan Ball will identify situations, thoughts, and feelings that trigger internal anger, angry verbal and or aggressive behavioral actions as evidenced by self recorded report  Progress on Goals: Progressing   Interventions: CBT and Supportive  Summary: Susan Ball is a 51 y.o. female who  is referred for services by [name]. She just completed IOP on March 27, 2019. She has long standing history of bipolar disorder and borderline personality disorder. She has had 3 psychiatric hospitalizations. The last was in 2010 at [facility] due to suicide attempt. She participated in outpatient therapy intermittently at [facility] for 3 years. She last was seen there 6 months ago.  Current symptoms include depressed mood, crying spells, mood swings, aggression, anger, excessive worry, sleep difficulty, poor concentration, and memory difficulty.  Patient was seen via virtual visit about 5-6  weeks ago. Patient reports increased symptoms of depression for the last 3 weeks.  Symptoms include depressed mood,  crying spells, decreased interest in activities, poor motivation, and social withdrawal.  She also reports sleep difficulty and decreased appetite.  She reports experiencing more frequent fleeting suicidal ideations but denies any plan or intent to harm self.  She identifies possible triggers as frustration with one of her medical providers regarding her GI issues.  She identifies difficulty concentrating during her quiet time as another trigger.  Patient reports her husband has pushed her to continue going to the gym.  She reports decreased marijuana use.   Suicidal/Homicidal: No, without plan, without intent.  She agrees to call call 911, 988 or have someone take her to the ED should symptoms worsen  Therapist Response:  reviewed symptoms, assisted patient identify triggers of increased symptoms of depression, assisted patient identify realistic expectations of self, assisted patient identify ways to resist suicidal thoughts and feelings including discussing her reasons for living, identify people she could talk to for support including her husband and 2 of her friends, identify activities she could do to comfort self such as listening to music when feeling distressed, also encouraged patient to continue going to the gym and use daily planning, also developed plan with patient to improve self-care especially regarding eating patterns, developed plan with patient to call Dr. Lolly Mustache for an earlier appointment for medication management, also developed plan with patient to call her medical provider regarding GI issues    plan: Return again in 2 weeks.  Diagnosis: Axis I: Bipolar Disorder    Axis II: Borderline Personality Dis.  Collaboration of Care: Psychiatrist AEB patient seeing psychiatrist Dr. Lolly Mustache, clinician reviewing chart.  Patient/Guardian was advised Release of Information must be obtained prior to any record  release in order to collaborate their care with an outside provider.  Patient/Guardian was advised if they have not already done so to contact the registration department to sign all necessary forms in order for [name] to release information regarding their care.   Consent: Patient/Guardian gives verbal consent for treatment and assignment of benefits for services provided during this visit. Patient/Guardian expressed understanding and agreed to proceed.   Susan Salvage, LCSW 12/13/2021

## 2021-12-27 ENCOUNTER — Encounter: Payer: Self-pay | Admitting: Emergency Medicine

## 2021-12-27 ENCOUNTER — Ambulatory Visit
Admission: EM | Admit: 2021-12-27 | Discharge: 2021-12-27 | Disposition: A | Discharge status: Home / Self Care | Payer: Medicare Other | Attending: Nurse Practitioner | Admitting: Nurse Practitioner

## 2021-12-27 ENCOUNTER — Other Ambulatory Visit: Payer: Self-pay

## 2021-12-27 ENCOUNTER — Ambulatory Visit (INDEPENDENT_AMBULATORY_CARE_PROVIDER_SITE_OTHER): Payer: Medicare Other

## 2021-12-27 ENCOUNTER — Ambulatory Visit (HOSPITAL_COMMUNITY): Payer: Medicare Other | Admitting: Psychiatry

## 2021-12-27 DIAGNOSIS — S62663A Nondisplaced fracture of distal phalanx of left middle finger, initial encounter for closed fracture: Secondary | ICD-10-CM

## 2021-12-27 DIAGNOSIS — M19042 Primary osteoarthritis, left hand: Secondary | ICD-10-CM | POA: Diagnosis not present

## 2021-12-27 DIAGNOSIS — S67193A Crushing injury of left middle finger, initial encounter: Secondary | ICD-10-CM | POA: Diagnosis not present

## 2021-12-27 NOTE — Discharge Instructions (Addendum)
X-ray shows a fracture to the tip of the left middle finger. RICE therapy, rest, ice, compression, and elevation. A splint has been placed to provide immobilization and stabilization.  Recommend following up with orthopedist/hand specialist within the next 48 to 72 hours for reevaluation. May take over-the-counter ibuprofen or Tylenol as needed for pain or discomfort. Work note was provided with lifting restrictions for the next 3 days.  Restrictions need to be modified by orthopedics/hand specialist. Follow-up as needed.

## 2021-12-27 NOTE — ED Provider Notes (Signed)
[facility]    CSN: [id number] Arrival date & time: 12/27/21  1557      History   Chief Complaint Chief Complaint  Patient presents with   Hand Injury    HPI Susan Ball is a 51 y.o. female.   The history is provided by the patient.   Patient presents with left middle finger pain and swelling after she dropped a 60 pound weight on her finger at the gym.  She complains of tenderness to the tip of the left finger.  She also complains of numbness and tingling.  She denies radiation of pain, or injury to the other fingers on her hand.  She denies any previous injury.  She reports that she has not taken any medication for her symptoms.  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      Home Medications    Prior to Admission medications   Medication Sig Start Date End Date Taking? Authorizing Provider  albuterol (VENTOLIN HFA) 108 (90 Base) MCG/ACT inhaler SMARTSIG:2 Puff(s) By Mouth 4 Times Daily PRN 11/03/20   [provider]  calcium carbonate (OS-CAL - DOSED IN MG OF  ELEMENTAL CALCIUM) 1250 (500 Ca) MG tablet Take 1 tablet by mouth daily with breakfast.     [provider]  Cholecalciferol 25 MCG (1000 UT) capsule Take 1,000 Units by mouth daily.     [provider]  cloNIDine (CATAPRES) 0.2 MG tablet Take 1 tablet (0.2 mg total) by mouth at bedtime. 10/07/21 10/07/22  [name], Phillips Grout, MD  Cyanocobalamin (B-12) 5000 MCG SUBL See admin instructions.    [provider]  Ferrous Sulfate (SLOW FE) 142 (45 Fe) MG TBCR 1 tablet 11/03/20   [provider]  FLUoxetine (PROZAC) 20 MG capsule Take 1 capsule (20 mg total) by mouth daily. 10/07/21 10/07/22  Cleotis Nipper, MD  fluticasone Aleda Grana) 50 MCG/ACT nasal spray 2 sprays 05/30/10   [provider]  lithium carbonate (LITHOBID) 300 MG CR tablet Take one tab (300 mg total) twice a day. 10/07/21   [name], Phillips Grout, MD  naproxen (NAPROSYN) 500 MG tablet Take 1 tablet (500 mg total) by mouth 2 (two) times daily as needed. 10/20/21   Particia Nearing, PA-C  OLANZapine (ZYPREXA) 5 MG tablet Take 1 tablet (5 mg total) by mouth at bedtime. Patient not taking: Reported on 05/10/2021 03/21/21 03/21/22  Cleotis Nipper, MD  pantoprazole (PROTONIX) 40 MG tablet SMARTSIG:1 Tablet(s) By Mouth Morning-Evening 04/30/21   [provider]  solifenacin (VESICARE) 5 MG tablet Take 5 mg by mouth daily.  09/21/20   [provider]  tiZANidine (ZANAFLEX) 4 MG tablet Take 1 tablet (4 mg total) by mouth every 8 (eight) hours as needed for muscle spasms. Do not take with alcohol or while driving or operating heavy machinery.  May cause drowsiness. 10/21/21   Valentino Nose, NP  Trimethoprim HCl (TRIMPEX PO) Take by mouth.    [provider]  vortioxetine HBr (TRINTELLIX) 20 MG TABS tablet Take 1 tablet (20 mg total) by mouth daily. 10/07/21   [name], Phillips Grout, MD    Family History Family History  Problem Relation Age of Onset   Depression Sister    Depression Brother      Social History Social History   Tobacco Use   Smoking status: Never   Smokeless tobacco: Never  Vaping Use   Vaping Use: Never used  Substance Use Topics   Alcohol use: Not Currently   Drug use: Not Currently    Types: Marijuana    Comment: States she stopped smoking THC in 2002     Allergies   Fexofenadine and Lamictal [lamotrigine]   Review of Systems Review of Systems Per HPI  Physical Exam Triage Vital Signs ED Triage Vitals  Enc Vitals Group     BP 12/27/21 1653 130/68     Pulse Rate 12/27/21 1653 66     Resp 12/27/21 1653 20     Temp 12/27/21 1653 99 F (37.2 C)     Temp Source 12/27/21 1653 Oral     SpO2 12/27/21 1653 98 %     Weight --      Height --      Head Circumference --      Peak Flow --      Pain Score 12/27/21 1651 6     Pain Loc --      Pain Edu? --      Excl. in GC? --    No data found.  Updated Vital Signs BP 130/68 (BP Location: Right Arm)   Pulse 66   Temp 99 F (37.2 C) (Oral)   Resp 20   LMP 12/05/2021 (Approximate)   SpO2 98%   Visual Acuity Right Eye Distance:   Left Eye Distance:   Bilateral Distance:    Right Eye Near:   Left Eye Near:    Bilateral Near:     Physical Exam Vitals and nursing note reviewed.  Constitutional:      General: She is not in acute distress.    Appearance: Normal appearance.  HENT:     Head: Normocephalic.  Eyes:     Extraocular Movements: Extraocular movements intact.     Pupils: Pupils are equal, round, and reactive to light.  Pulmonary:     Effort: Pulmonary effort is normal.  Musculoskeletal:     Left hand: Swelling (distal phalanx of middle finger) and tenderness (distal phalanx of middle finger) present. No deformity. Decreased range of motion (distal phalanx of middle finger). Normal sensation. Normal capillary refill. Normal pulse.     Cervical back: Normal range of motion.  Skin:    General: Skin is warm and dry.  Neurological:     Mental Status: She is alert.   Psychiatric:        Mood and Affect: Mood normal.        Behavior: Behavior normal.      UC Treatments / Results  Labs (all labs ordered are listed, but only abnormal results are displayed) Labs Reviewed - No data to display  EKG   Radiology DG Hand Complete Left  Result Date: 12/27/2021 CLINICAL DATA:  Middle finger pain after dropping a 60 pound weight on to the finger. EXAM: LEFT HAND - COMPLETE 3+ VIEW COMPARISON:  None Available. FINDINGS: Longitudinal nondisplaced fracture of the ulnar side of the tuft of the distal phalanx middle finger best appreciated on the frontal projection. Mild interphalangeal articular space narrowing compatible with osteoarthritis. IMPRESSION: 1. Longitudinal nondisplaced fracture of the ulnar side of the tuft of the distal phalanx middle finger. 2. Mild osteoarthritis. Electronically Signed   By: Gaylyn Rong M.D.   On: 12/27/2021 17:11    Procedures Procedures (including critical care time)  Medications Ordered in UC Medications - No data to display  Initial Impression / Assessment and Plan / UC Course  I have reviewed the triage vital signs and the nursing notes.  Pertinent labs & imaging results that were available during my care of the patient were reviewed by me and considered in my medical decision making (see chart for details).  X-ray performed, nondisplaced fracture of the ulnar side of the tuft of the distal phalanx of left middle finger present.  Baseball splint was applied.  RICE therapy was provided to the patient.  Patient was given work restrictions for the next 3 days until she can follow-up with orthopedics/hand specialist.  Patient advised to take ibuprofen as needed for pain or discomfort.  Patient states that she does have an orthopedist that she can follow-up with.  Patient verbalizes understanding.  All questions were answered.  Patient is stable for discharge. Final Clinical Impressions(s) / UC Diagnoses   Final  diagnoses:  Closed nondisplaced fracture of distal phalanx of left middle finger, initial encounter     Discharge Instructions      X-ray shows a fracture to the tip of the left middle finger. RICE therapy, rest, ice, compression, and elevation. A splint has been placed to provide immobilization and stabilization.  Recommend following up with orthopedist/hand specialist within the next 48 to 72 hours for reevaluation. May take over-the-counter ibuprofen or Tylenol as needed for pain or discomfort. Work note was provided with lifting restrictions for the next 3 days.  Restrictions need to be modified by orthopedics/hand specialist. Follow-up as needed.     ED Prescriptions   None    PDMP not reviewed this encounter.   Abran Cantor, NP 12/27/21 1734

## 2021-12-27 NOTE — ED Triage Notes (Signed)
Pt reports dropped 60 lb weight on left hand. Pt reports most of pain noted to be left middle finger. Tip of finger swollen with mild discoloration.

## 2021-12-30 DIAGNOSIS — S62663A Nondisplaced fracture of distal phalanx of left middle finger, initial encounter for closed fracture: Secondary | ICD-10-CM | POA: Diagnosis not present

## 2021-12-30 DIAGNOSIS — S67193A Crushing injury of left middle finger, initial encounter: Secondary | ICD-10-CM | POA: Diagnosis not present

## 2022-01-02 DIAGNOSIS — K5900 Constipation, unspecified: Secondary | ICD-10-CM | POA: Diagnosis not present

## 2022-01-02 DIAGNOSIS — R7989 Other specified abnormal findings of blood chemistry: Secondary | ICD-10-CM | POA: Diagnosis not present

## 2022-01-02 DIAGNOSIS — D509 Iron deficiency anemia, unspecified: Secondary | ICD-10-CM | POA: Diagnosis not present

## 2022-01-02 IMAGING — DX DG HAND COMPLETE 3+V*R*
3 series · 3 of 3 positions shown · non-contrast
Comparison: 08/07/2008

CLINICAL DATA: Pain and bleeding middle finger after blunt trauma

EXAM:
RIGHT HAND - COMPLETE 3+ VIEW

[hand ap]
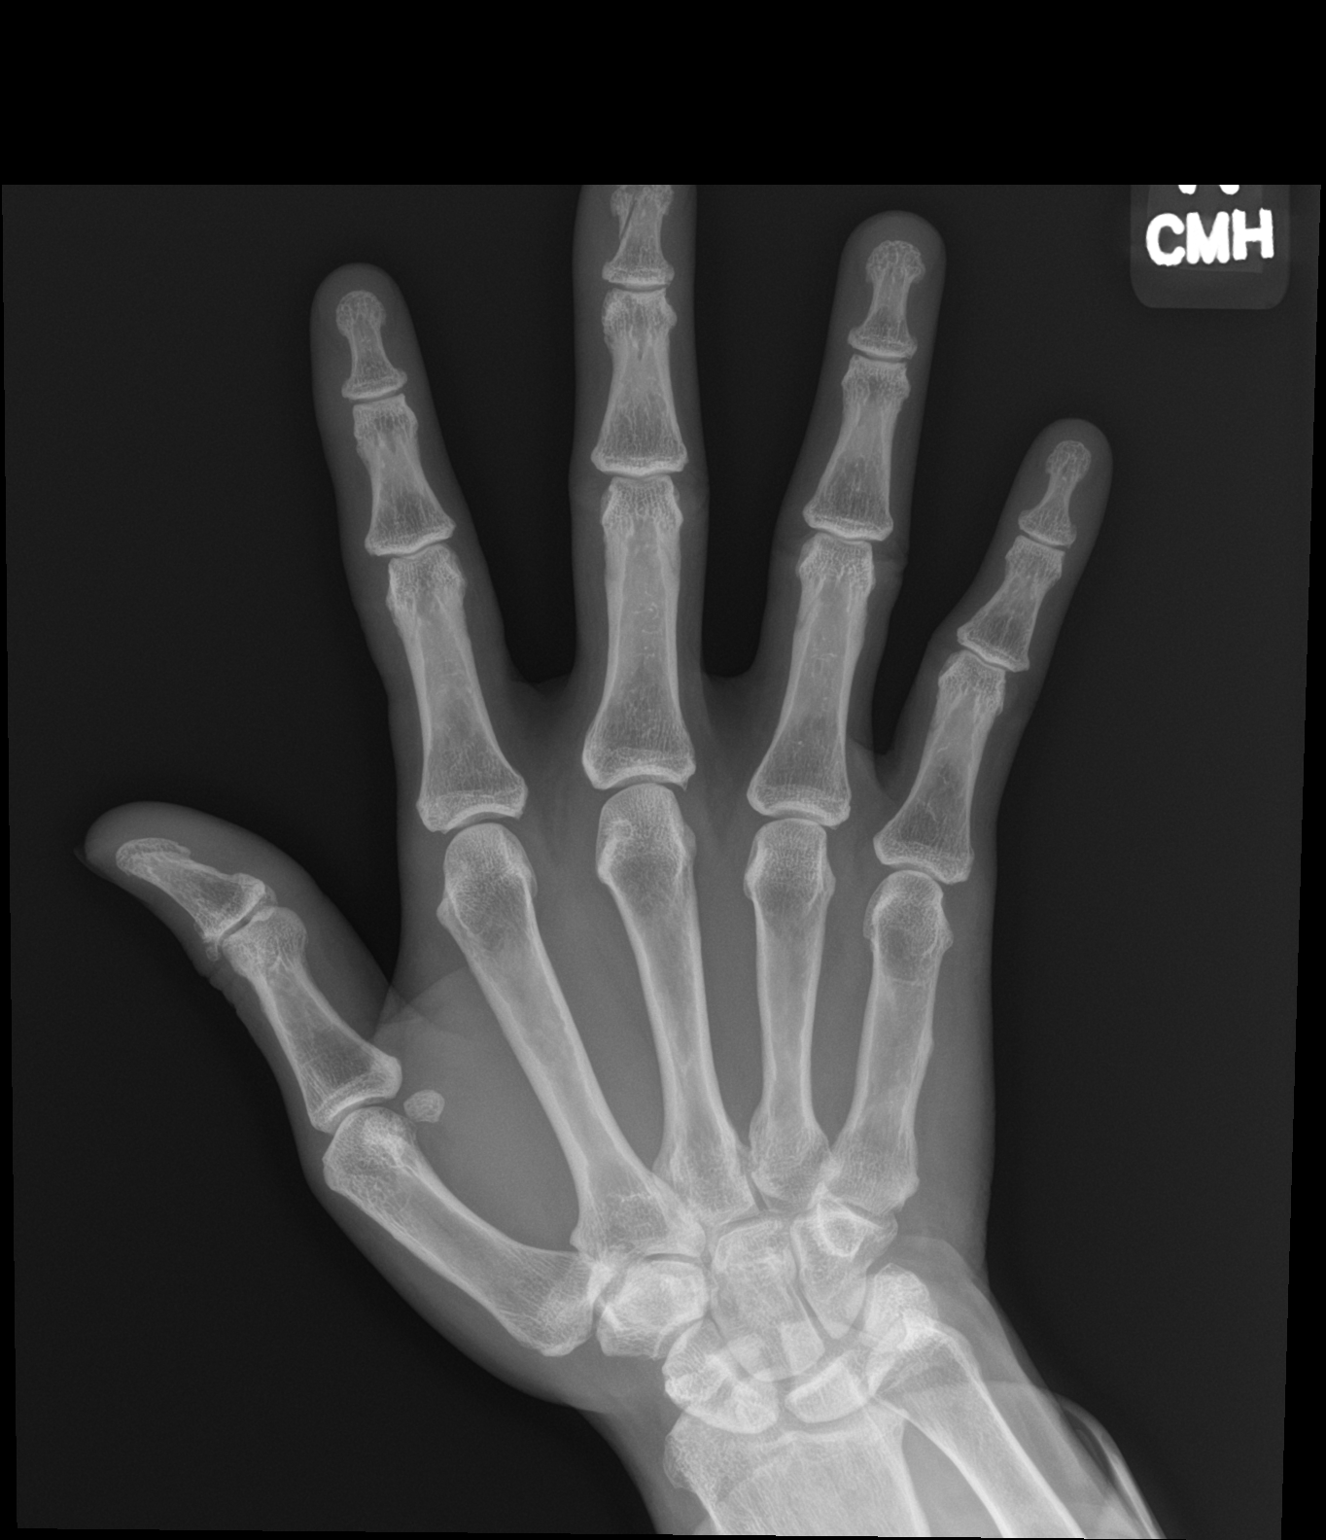

[hand obl]
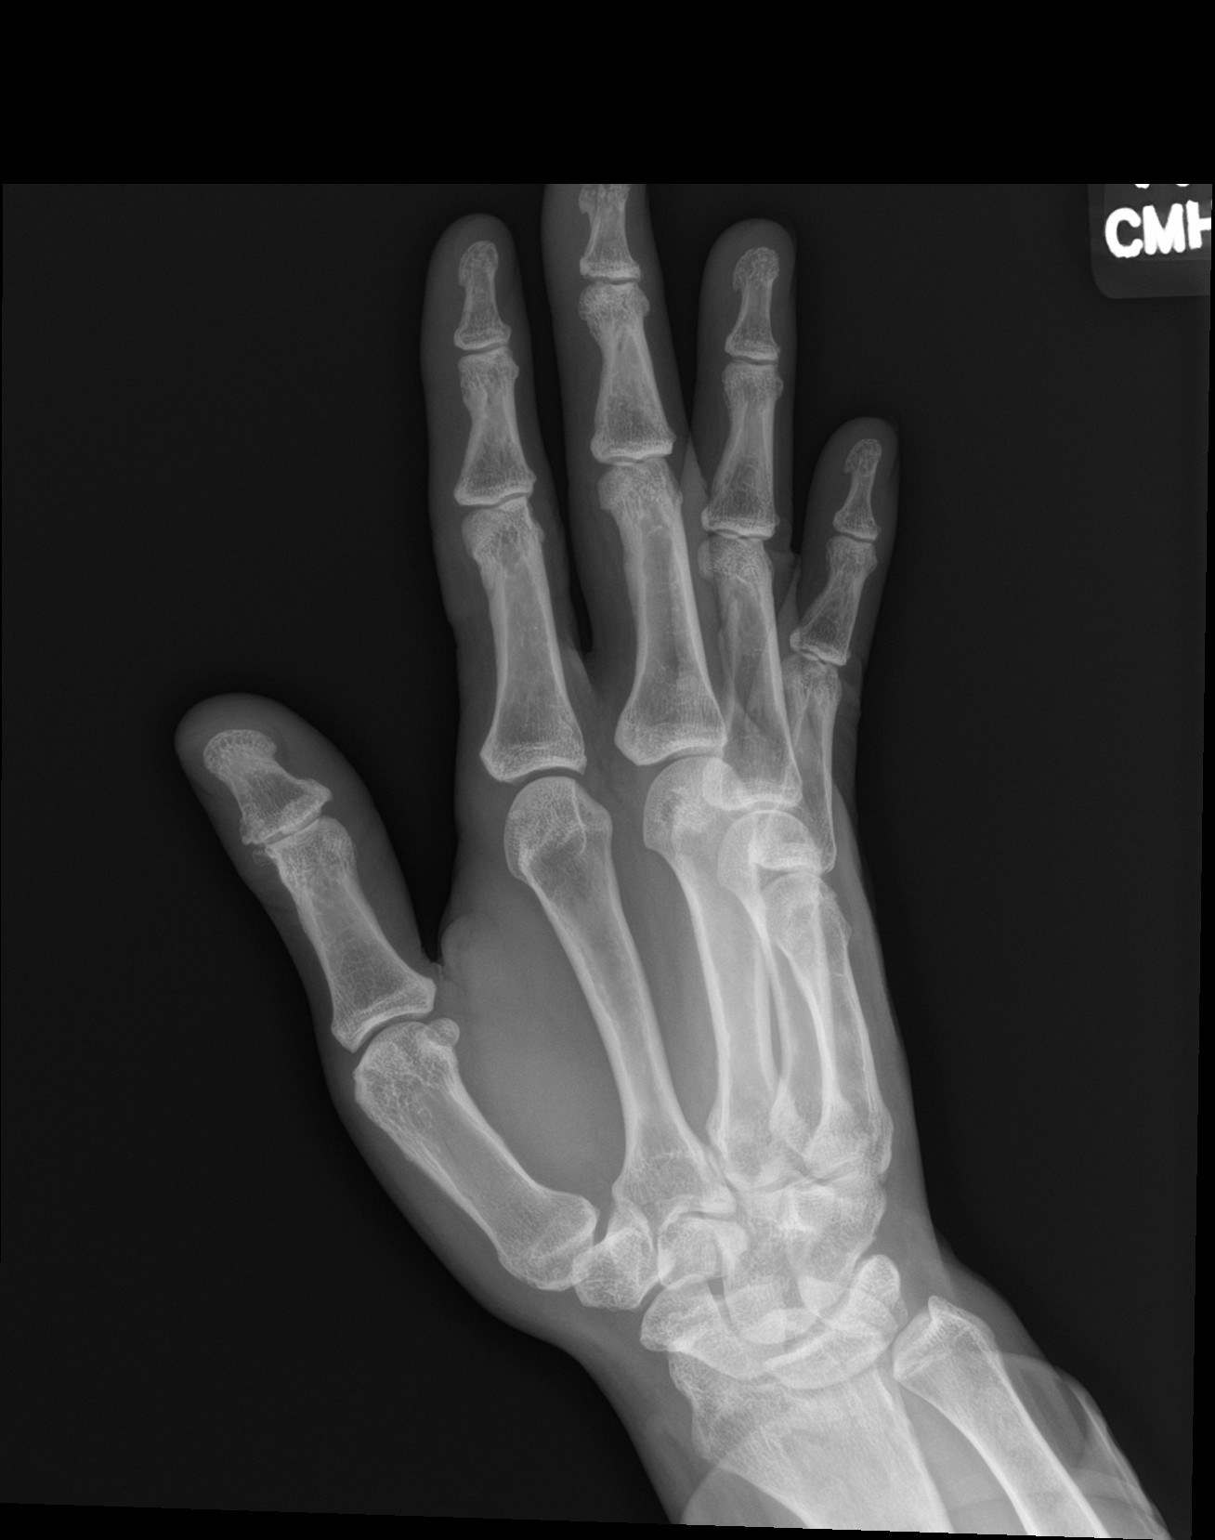

[hand lat]
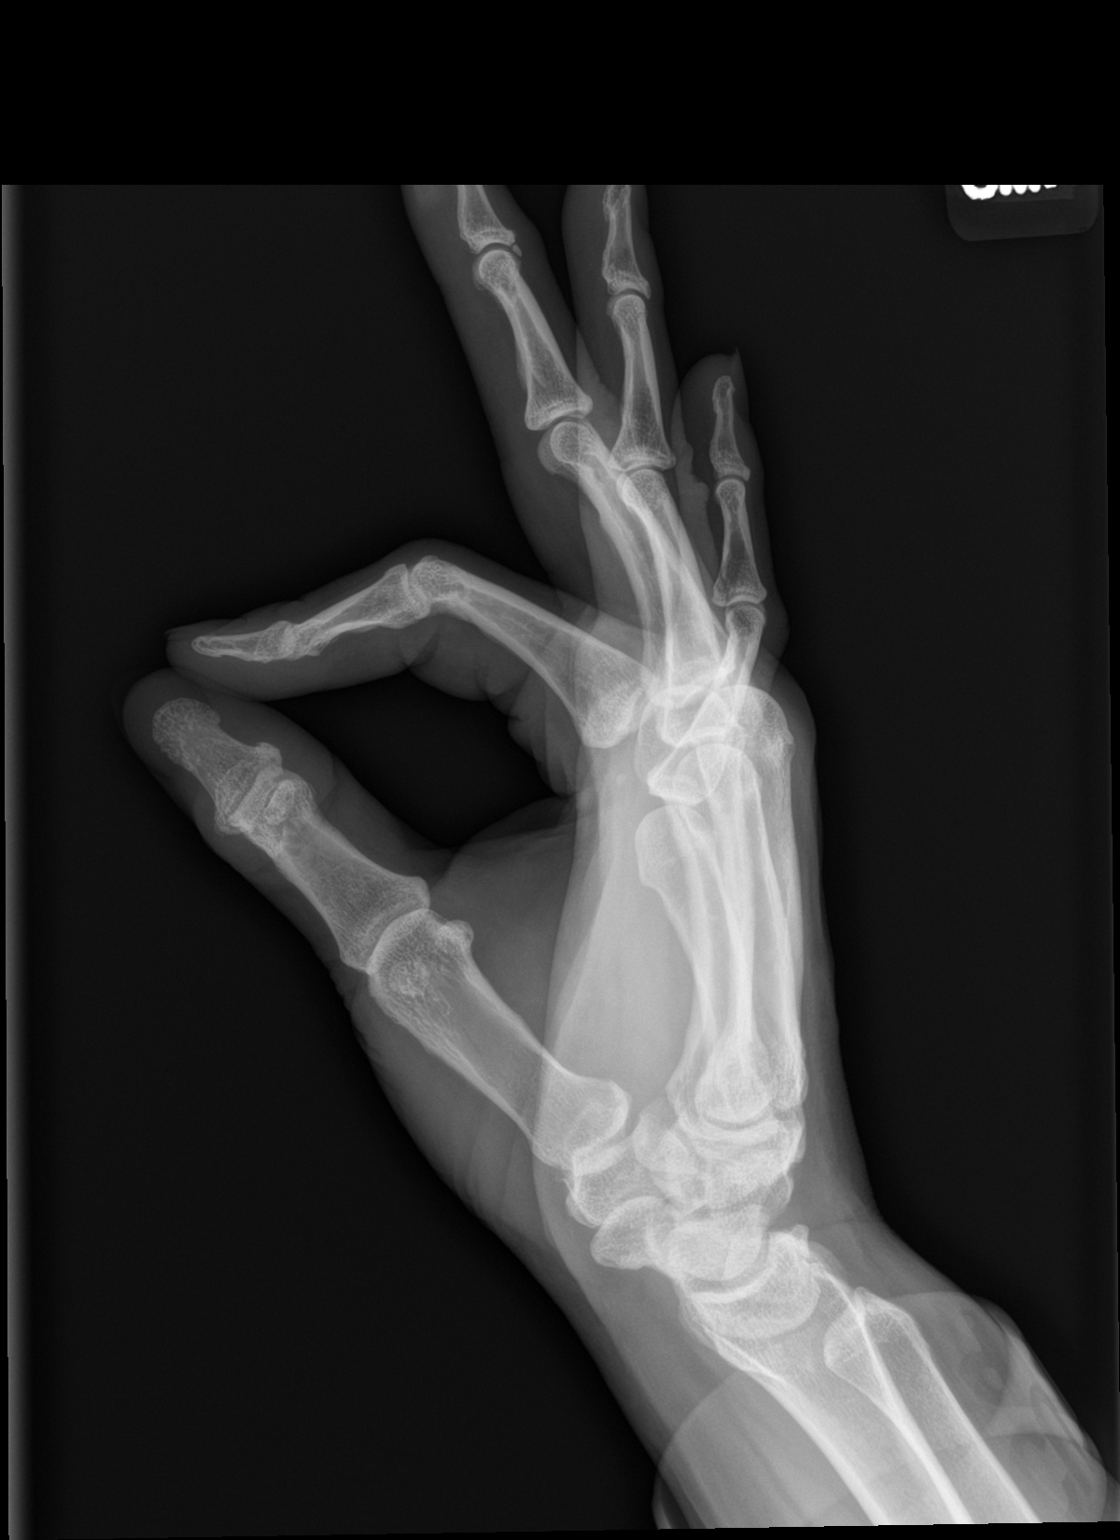

[3 of 3 positions shown; findings below may reference images not displayed]

FINDINGS: Comminuted fracture of the tuft distal phalanx long finger. Less
than 1 mm displacement. There is also fracture of a spur from the
dorsal base of the distal phalanx, distracted less than 1 mm.
No other fracture seen.

Otherwise normal mineralization and alignment. No significant
osseous degenerative change.
IMPRESSION: Right long finger distal phalanx fractures as above

## 2022-01-06 ENCOUNTER — Encounter (HOSPITAL_COMMUNITY): Payer: Self-pay | Admitting: Psychiatry

## 2022-01-06 ENCOUNTER — Telehealth (HOSPITAL_BASED_OUTPATIENT_CLINIC_OR_DEPARTMENT_OTHER): Payer: Medicare Other | Admitting: Psychiatry

## 2022-01-06 VITALS — Wt 261.0 lb

## 2022-01-06 DIAGNOSIS — F319 Bipolar disorder, unspecified: Secondary | ICD-10-CM

## 2022-01-06 DIAGNOSIS — F419 Anxiety disorder, unspecified: Secondary | ICD-10-CM | POA: Diagnosis not present

## 2022-01-06 DIAGNOSIS — F6089 Other specific personality disorders: Secondary | ICD-10-CM | POA: Diagnosis not present

## 2022-01-06 DIAGNOSIS — F4310 Post-traumatic stress disorder, unspecified: Secondary | ICD-10-CM | POA: Diagnosis not present

## 2022-01-06 MED ORDER — VORTIOXETINE HBR 20 MG PO TABS: 20.0000 mg | ORAL_TABLET | Freq: Every day | ORAL | 0 refills | Status: DC

## 2022-01-06 MED ORDER — FLUOXETINE HCL 20 MG PO CAPS: 20.0000 mg | ORAL_CAPSULE | Freq: Every day | ORAL | 0 refills | Status: DC

## 2022-01-06 MED ORDER — CLONIDINE HCL 0.2 MG PO TABS: 0.2000 mg | ORAL_TABLET | Freq: Every day | ORAL | 0 refills | Status: DC

## 2022-01-06 MED ORDER — LITHIUM CARBONATE ER 450 MG PO TBCR: EXTENDED_RELEASE_TABLET | ORAL | 0 refills | Status: DC

## 2022-01-06 NOTE — Progress Notes (Signed)
Virtual Visit via Video Note  I connected with Susan Ball on 01/06/22 at 10:00 AM EST by a video enabled telemedicine application and verified that I am speaking with the correct person using two identifiers.  Location: Patient: Home Provider: Home Office   I discussed the limitations of evaluation and management by telemedicine and the availability of in person appointments. The patient expressed understanding and agreed to proceed.  History of Present Illness: Patient is evaluated by video session.  She reported had an episode of severe depression 2 weeks ago where she described having crying episodes, passive suicidal thoughts but no plan or any intent.  She is not sure what triggered the episode but talking to her husband was helpful.  She is seeing [name] but there has been an issue getting appointments.  She is supposed to her appointment in November but it was rescheduled and she is going to see now in January.  She is feeling better now.  She is sleeping good and denies any nightmares or flashback.  She is not engaged in any self-abusive behavior.  She still have moments of irritability, frustration, depression but no hallucinations or any paranoia.  She had a good support from her husband.  Her blood pressure remains up and down and fluctuating.  She is on clonidine, Trintellix, Prozac and lithium.  She has no tremors, shakes or any EPS.  She denies any major panic attack or mania.  She feels overall symptoms are better but not sure what triggered last week depressive episodes.  She talked to her 69 year old son almost daily who lives in [place].  Patient denies drinking or using any illegal substances.  Last week she accidentally dropped the weight on her middle finger when she was in gym.  She could not go that day to work but now she is back to work.  She is working 16 hours a week.  Her plan is to go back to gym regular basis since pain is not as bad.    Past Psychiatric  History: H/O Bipolar, anxiety and borderline traits. On meds since age 51.  H/O inpatient, suicidal attempt, overdose, jump from the car and banging head.  Tried Lamictal (rash), Seroquel, Abilify, doxepin, Trazodone, Vraylar and hydroxyzine. H/O n/c with meds, refusing to eat and needed PICC line. H/O anger, road rage, paranoia and trust issues. Had DBT with good response. Genesight test shows Pristiq, Fetzima and Viibryd, lithium, tegretol and Valproic in better column.    Psychiatric Specialty Exam: Physical Exam  Review of Systems  Weight 261 lb (118.4 kg), last menstrual period 12/05/2021.Body mass index is 43.43 kg/m.  General Appearance: Casual  Eye Contact:  Fair  Speech:  Normal Rate  Volume:  Normal  Mood:  Dysphoric  Affect:  Congruent  Thought Process:  Goal Directed  Orientation:  Full (Time, Place, and Person)  Thought Content:  Rumination  Suicidal Thoughts:  No  Homicidal Thoughts:  No  Memory:  Immediate;   Good Recent;   Fair Remote;   Fair  Judgement:  Fair  Insight:  Shallow  Psychomotor Activity:  Normal  Concentration:  Concentration: Fair and Attention Span: Fair  Recall:  [employer] of Knowledge:  Fair  Language:  Good  Akathisia:  No  Handed:  Right  AIMS (if indicated):     Assets:  Communication Skills Desire for Improvement Housing Social Support Transportation  ADL's:  Intact  Cognition:  WNL  Sleep:   3-4 hrs  Assessment and Plan: Bipolar disorder type I.  Cluster B traits.  PTSD.  Anxiety.  Discussed episode of severe depression few days ago with suicidal thoughts but no plan.  She is not sure what triggered but now she is feeling better.  She still have some mood lability and chronic insomnia but no active suicidal thoughts and her nightmares are much better.  I encourage to call Peggy's office to consider getting an earlier appointment since she had not seen in a while.  I also recommend to try going up on lithium 450 mg 2 times a  day.  In the past she has taken higher dose which helped but she also had borderline high lithium level and it was unclear if level was not taken at the right time.  We need to order lithium level.  Continue Trintellix 20 mg daily, Prozac 20 mg daily and Klonopin 0.2 mg at bedtime.  Recommend should talk to PCP if they will take over clonidine and consider adjusting the dose to help the blood pressure.  Recommended to call us back if she has any question or any concern.  Follow-up in 4 weeks.   Follow Up Instructions:    I discussed the assessment and treatment plan with the patient. The patient was provided an opportunity to ask questions and all were answered. The patient agreed with the plan and demonstrated an understanding of the instructions.   The patient was advised to call back or seek an in-person evaluation if the symptoms worsen or if the condition fails to improve as anticipated.  Collaboration of Care: Other provider involved in patient's care AEB notes are available in epic to review.  Patient/Guardian was advised Release of Information must be obtained prior to any record release in order to collaborate their care with an outside provider. Patient/Guardian was advised if they have not already done so to contact the registration department to sign all necessary forms in order for [name] to release information regarding their care.   Consent: Patient/Guardian gives verbal consent for treatment and assignment of benefits for services provided during this visit. Patient/Guardian expressed understanding and agreed to proceed.    I provided 23 minutes of non-face-to-face time during this encounter.   Cleotis Nipper, MD

## 2022-01-17 ENCOUNTER — Ambulatory Visit (INDEPENDENT_AMBULATORY_CARE_PROVIDER_SITE_OTHER): Payer: Medicare Other | Admitting: Psychiatry

## 2022-01-17 DIAGNOSIS — F319 Bipolar disorder, unspecified: Secondary | ICD-10-CM

## 2022-01-17 NOTE — Progress Notes (Signed)
Virtual Visit via Video Note  I connected with Susan Ball on 01/17/22 at 9:02 AM EST by a video enabled telemedicine application and verified that I am speaking with the correct person using two identifiers.  Location: Patient: Home Provider: [facility] [place] office    I discussed the limitations of evaluation and management by telemedicine and the availability of in person appointments. The patient expressed understanding and agreed to proceed.  I provided 50 minutes of non-face-to-face time during this encounter.   [name], LCSW     THERAPIST PROGRESS NOTE            Session Time: Tuesday 01/17/2022 9:02 AM - 9:52 AM   Participation Level: Active  Behavioral Response: CasualAlert/depressed/anxious/tearful  Type of Therapy: Individual Therapy  Treatment Goals addressed: [name] will reduce the amount of anger related incident/outburst by 50% for 4 consecutive weeks/Erline will identify situations, thoughts, and feelings that trigger internal anger, angry verbal and or aggressive behavioral actions as evidenced by self recorded report  Progress on Goals: Progressing   Interventions: CBT and Supportive  Summary: Susan Ball is a 51 y.o. female who  is referred for services by [name]. She just completed IOP on March 27, 2019. She has long standing history of bipolar disorder and borderline personality disorder. She has had 3 psychiatric hospitalizations. The last was in 2010 at [facility] due to suicide attempt. She participated in outpatient therapy intermittently at [facility] for 3 years. She last was seen there 6 months ago.  Current symptoms include depressed mood, crying spells, mood swings, aggression, anger, excessive worry, sleep difficulty, poor concentration, and memory difficulty.  Patient was seen via virtual visit about 4 weeks ago. Patient reports experiencing intense symptoms of depression and suicidal ideations about 3 to 4 weeks ago but  talking with her husband who listened and was supportive.  She reports initially having a plan of overdosing on medication but then decided against it.  She also reports she experienced fleeting thoughts of hitting a car but decided against it.  She reports seeing psychiatrist Dr. Lolly Mustache 2 weeks ago and has begun taking increased dosage of lithium per his instructions.  Patient reports this has been helpful and states feeling better.  She denies any current suicidal ideations, plan. or intent.  She still experiences stress regarding conflict with her husband as well and has conflict among her other family members including siblings and her son.  She expresses frustration as she states feeling caught in the middle.  She reports increased use of her spirituality as she has been attending her mother's church in person for the last 3-4 since Sundays.  Patient reports this has been very helpful as she feels welcomed by the congregation and connects with the pastor's teaching.  Suicidal/Homicidal: No, without plan, without intent.  She agrees to call call 911, [phone] or have someone take her to the ED should symptoms worsen  Therapist Response:  reviewed symptoms, assisted patient identify triggers of increased symptoms of depression, praised and reinforced patient's use of her support system as well as contacting Dr. Lolly Mustache, discussed stressors, facilitated expression of thoughts and feelings, validated feelings, reviewed distress tolerance skills, assisted patient identify ways to use assertiveness skills/set/maintain limits regarding family conflict, also did role-play, praised and reinforced patient's increased use of her spirituality, discussed effects, assisted patient identify ways to maintain as well as nurture her efforts, developed plan with patient to take notes during sermons and review during the week, encouraged patient  to maintain efforts to do daily devotional, also discussed possibility of patient  attending intensive outpatient, patient is receptive to this.  plan: Return again in 2 weeks.  Diagnosis: Axis I: Bipolar Disorder    Axis II: Borderline Personality Dis.  Collaboration of Care: Psychiatrist AEB patient seeing psychiatrist Dr. Lolly Mustache, clinician reviewing chart.  Therapist also will follow-up regarding possible referral to intensive outpatient.  Patient/Guardian was advised Release of Information must be obtained prior to any record release in order to collaborate their care with an outside provider. Patient/Guardian was advised if they have not already done so to contact the registration department to sign all necessary forms in order for [name] to release information regarding their care.   Consent: Patient/Guardian gives verbal consent for treatment and assignment of benefits for services provided during this visit. Patient/Guardian expressed understanding and agreed to proceed.   [name], LCSW 01/17/2022

## 2022-01-20 DIAGNOSIS — S62662D Nondisplaced fracture of distal phalanx of right middle finger, subsequent encounter for fracture with routine healing: Secondary | ICD-10-CM | POA: Diagnosis not present

## 2022-01-20 DIAGNOSIS — S62663D Nondisplaced fracture of distal phalanx of left middle finger, subsequent encounter for fracture with routine healing: Secondary | ICD-10-CM | POA: Diagnosis not present

## 2022-01-20 DIAGNOSIS — S67192D Crushing injury of right middle finger, subsequent encounter: Secondary | ICD-10-CM | POA: Diagnosis not present

## 2022-01-20 DIAGNOSIS — R52 Pain, unspecified: Secondary | ICD-10-CM | POA: Diagnosis not present

## 2022-01-20 DIAGNOSIS — L905 Scar conditions and fibrosis of skin: Secondary | ICD-10-CM | POA: Diagnosis not present

## 2022-01-20 DIAGNOSIS — S67193D Crushing injury of left middle finger, subsequent encounter: Secondary | ICD-10-CM | POA: Diagnosis not present

## 2022-01-30 DIAGNOSIS — N3281 Overactive bladder: Secondary | ICD-10-CM | POA: Diagnosis not present

## 2022-01-30 DIAGNOSIS — J4520 Mild intermittent asthma, uncomplicated: Secondary | ICD-10-CM | POA: Diagnosis not present

## 2022-02-02 ENCOUNTER — Telehealth (HOSPITAL_COMMUNITY): Payer: Self-pay | Admitting: Psychiatry

## 2022-02-02 NOTE — Telephone Encounter (Signed)
[facility] mental health section completed. No concerns.

## 2022-02-03 ENCOUNTER — Telehealth (HOSPITAL_BASED_OUTPATIENT_CLINIC_OR_DEPARTMENT_OTHER): Payer: 59 | Admitting: Psychiatry

## 2022-02-03 ENCOUNTER — Encounter (HOSPITAL_COMMUNITY): Payer: Self-pay | Admitting: Psychiatry

## 2022-02-03 DIAGNOSIS — F419 Anxiety disorder, unspecified: Secondary | ICD-10-CM

## 2022-02-03 DIAGNOSIS — F6089 Other specific personality disorders: Secondary | ICD-10-CM | POA: Diagnosis not present

## 2022-02-03 DIAGNOSIS — F319 Bipolar disorder, unspecified: Secondary | ICD-10-CM | POA: Diagnosis not present

## 2022-02-03 DIAGNOSIS — F4310 Post-traumatic stress disorder, unspecified: Secondary | ICD-10-CM | POA: Diagnosis not present

## 2022-02-03 MED ORDER — LITHIUM CARBONATE ER 450 MG PO TBCR: EXTENDED_RELEASE_TABLET | ORAL | 2 refills | Status: DC

## 2022-02-03 MED ORDER — CLONIDINE HCL 0.2 MG PO TABS: 0.2000 mg | ORAL_TABLET | Freq: Every day | ORAL | 0 refills | Status: DC

## 2022-02-03 MED ORDER — FLUOXETINE HCL 20 MG PO CAPS: 20.0000 mg | ORAL_CAPSULE | Freq: Every day | ORAL | 0 refills | Status: DC

## 2022-02-03 MED ORDER — VORTIOXETINE HBR 20 MG PO TABS: 20.0000 mg | ORAL_TABLET | Freq: Every day | ORAL | 0 refills | Status: DC

## 2022-02-03 NOTE — Progress Notes (Signed)
Virtual Visit via Video Note  I connected with [name] on 02/03/22 at 10:40 AM EST by a video enabled telemedicine application and verified that I am speaking with the correct person using two identifiers.  Location: Patient: In Car Provider: Home Office   I discussed the limitations of evaluation and management by telemedicine and the availability of in person appointments. The patient expressed understanding and agreed to proceed.  History of Present Illness: Patient is evaluated by video session.  On the last session we will increase lithium and she is taking 2 times a day.  She admitted it helps her mood, irritability and anger and less frequent suicidal thoughts.  She still struggle with sleep and having nightmares and flashback.  Finally she is reestablish therapy with Maurice Small.  She reported holidays were quiet because she does not like the holidays because she remember the family who is not around.  She has no tremors, shakes or any EPS.  She is concerned about her son who is going to join the [employer].  Patient understand that he need to do that he is supposed to do but he is worried about him.  Her son lives in [place] and she talks to him every day.  She lives with her husband who is supportive.  Patient does go to gym on a regular basis.  Her appetite is okay.  Her weight is stable.  She denies any hallucination, paranoia and she noticed her highs and lows are not as intense.  She is taking clonidine, Prozac, lithium and Trintellix.  Patient is not involved in any self-abusive behavior.  She denies any major panic attack.  Patient dropped the [facility] form which was completed yesterday.  Patient told that from requires every 2-year statement from the physician.  Patient has a history of admission under IVC many years ago.  Past Psychiatric History: H/O Bipolar, anxiety and borderline traits. On meds since age 27.  H/O inpatient, suicidal attempt, overdose, jump from the car and  banging head.  Tried Lamictal (rash), Seroquel, Abilify, doxepin, Trazodone, Vraylar and hydroxyzine. H/O n/c with meds, refusing to eat and needed PICC line. H/O anger, road rage, paranoia and trust issues. Had DBT with good response. Genesight test shows Pristiq, Fetzima and Viibryd, lithium, tegretol and Valproic in better column.    Psychiatric Specialty Exam: Physical Exam  Review of Systems  Weight 261 lb (118.4 kg).There is no height or weight on file to calculate BMI.  General Appearance: Casual  Eye Contact:  Good  Speech:  Clear and Coherent  Volume:  Normal  Mood:  Euthymic  Affect:  Congruent  Thought Process:  Goal Directed  Orientation:  Full (Time, Place, and Person)  Thought Content:  WDL  Suicidal Thoughts:  No  Homicidal Thoughts:  No  Memory:  Immediate;   Good Recent;   Fair Remote;   Fair  Judgement:  Fair  Insight:  Present  Psychomotor Activity:  Normal  Concentration:  Concentration: Fair and Attention Span: Fair  Recall:  [employer] of Knowledge:  Good  Language:  Good  Akathisia:  No  Handed:  Right  AIMS (if indicated):     Assets:  Communication Skills Desire for Improvement Housing Transportation  ADL's:  Intact  Cognition:  WNL  Sleep:   better      Assessment and Plan: Bipolar disorder type I.  Cluster B traits.  PTSD.  Anxiety.  Patient doing better since lithium dose increase and taking 2  times a day.  We will do a lithium level since last level was drawn almost a year ago.  Patient like to keep the current medicine which is working well.  Continue Trintellix 20 mg daily, Prozac 20 mg daily, clonidine 0.2 mg at bedtime and lithium 450 mg 2 times a day.  DMV form completed.  Patient is driving and she has no issue.  Encouraged to keep appointment with Maurice Small.  Recommended to call us back if she has any question or any concern.  Follow-up in 3 months.  Follow Up Instructions:    I discussed the assessment and treatment plan with the  patient. The patient was provided an opportunity to ask questions and all were answered. The patient agreed with the plan and demonstrated an understanding of the instructions.   The patient was advised to call back or seek an in-person evaluation if the symptoms worsen or if the condition fails to improve as anticipated.  Collaboration of Care: Other provider involved in patient's care AEB notes are available in epic to review.  Patient/Guardian was advised Release of Information must be obtained prior to any record release in order to collaborate their care with an outside provider. Patient/Guardian was advised if they have not already done so to contact the registration department to sign all necessary forms in order for [name] to release information regarding their care.   Consent: Patient/Guardian gives verbal consent for treatment and assignment of benefits for services provided during this visit. Patient/Guardian expressed understanding and agreed to proceed.    I provided 25 minutes of non-face-to-face time during this encounter.   Kathlee Nations, MD

## 2022-02-04 ENCOUNTER — Telehealth (HOSPITAL_COMMUNITY): Payer: Self-pay | Admitting: *Deleted

## 2022-02-04 ENCOUNTER — Other Ambulatory Visit (HOSPITAL_COMMUNITY): Payer: Self-pay | Admitting: *Deleted

## 2022-02-04 DIAGNOSIS — Z5181 Encounter for therapeutic drug level monitoring: Secondary | ICD-10-CM

## 2022-02-04 NOTE — Telephone Encounter (Signed)
Writer spoke with pt to advise lab orders sent to Susan Ball, [employer], [place]. Pt reminded to hold a.m. dose of Lthium prior to blood draw. Pt verbalizes understanding.

## 2022-02-06 ENCOUNTER — Telehealth (HOSPITAL_COMMUNITY): Payer: Self-pay | Admitting: *Deleted

## 2022-02-06 DIAGNOSIS — Z5181 Encounter for therapeutic drug level monitoring: Secondary | ICD-10-CM | POA: Diagnosis not present

## 2022-02-06 NOTE — Telephone Encounter (Signed)
Writer spoke with pt to advise that DMV paperwork is complete and she says she will pick up from the office.

## 2022-02-07 ENCOUNTER — Ambulatory Visit (INDEPENDENT_AMBULATORY_CARE_PROVIDER_SITE_OTHER): Payer: 59 | Admitting: Psychiatry

## 2022-02-07 DIAGNOSIS — F319 Bipolar disorder, unspecified: Secondary | ICD-10-CM | POA: Diagnosis not present

## 2022-02-07 LAB — LITHIUM LEVEL: Lithium Lvl: 0.8 mmol/L (ref 0.5–1.2)

## 2022-02-07 NOTE — Progress Notes (Signed)
Virtual Visit via Video Note  I connected with Susan Ball on 02/07/22 at 1:07 PM EST by a video enabled telemedicine application and verified that I am speaking with the correct person using two identifiers.  Location: Patient: Home Provider: [place] office    I discussed the limitations of evaluation and management by telemedicine and the availability of in person appointments. The patient expressed understanding and agreed to proceed.   I provided 48 minutes of non-face-to-face time during this encounter.   Alonza Smoker, LCSW    THERAPIST PROGRESS NOTE            Session Time: Tuesday 02/07/2021 1:07 PM - 1:55 PM   Participation Level: Active  Behavioral Response: CasualAlert/depressed/anxious/tearful  Type of Therapy: Individual Therapy  Treatment Goals addressed: [name] will reduce the amount of anger related incident/outburst by 50% for 4 consecutive weeks/[place]ina will identify situations, thoughts, and feelings that trigger internal anger, angry verbal and or aggressive behavioral actions as evidenced by self recorded report  Progress on Goals: Progressing   Interventions: CBT and Supportive  Summary: SHENICKA SUNDERLIN is a 52 y.o. female who  is referred for services by Dellia Nims. She just completed IOP on March 27, 2019. She has long standing history of bipolar disorder and borderline personality disorder. She has had 3 psychiatric hospitalizations. The last was in 2010 at [facility] due to suicide attempt. She participated in outpatient therapy intermittently at [facility] for 3 years. She last was seen there 6 months ago.  Current symptoms include depressed mood, crying spells, mood swings, aggression, anger, excessive worry, sleep difficulty, poor concentration, and memory difficulty.  Patient was seen via virtual visit about 4 weeks ago. Patient reports much improved mood and absence of anger outbursts since last session.  Crying spells have been  reduced from daily to about 1-2 times per week.  Patient is very pleased that she states she is handling things better.  She attributes this to increased dosage of lithium as prescribed by psychiatrist Dr. Adele Schilder.  Patient states being able to slow down and think through things better resulting in patient responding rather than reacting.  She cites two situations where she normally would have had an anger outbursts.  Instead, patient reports using self talk and relaxation techniques.  She continues to go to the gym and work.  She reports less stress about her family as she had an assertive conversation with her sister and her son.  Patient expresses some concern about son who has decided to join the [employer].  Patient continues to nurture her spirituality but expresses frustration her home church now is having services online rather than in person.  She continues to attend in her mother's church where there are in person services.  However, patient feels conflicted regarding managing this and is pressuring self to make a decision about where she should worship.   Suicidal/Homicidal: No, without plan, without intent.   Therapist Response:  reviewed symptoms, praised and reinforced patient's efforts to reduce impulsive behavior/anger outbursts, praised and reinforced patient's use of healthy coping strategies, discussed effects, praised and reinforced patient's medication compliance, discussed anger as a secondary emotion, began to assist patient identify underlying feelings beneath anger, discussed the role of emotions, developed plan with patient to keep anger log, also developed plan with patient to try to identify underlying feelings beneath anger, will send patient feelings wheel to assist her in her efforts    plan: Return again in 2 weeks.  Diagnosis:  Axis I: Bipolar Disorder    Axis II: Borderline Personality Dis.  Collaboration of Care: Psychiatrist AEB patient seeing psychiatrist Dr. Adele Schilder,  clinician reviewing chart.  Therapist also will follow-up regarding possible referral to intensive outpatient.  Patient/Guardian was advised Release of Information must be obtained prior to any record release in order to collaborate their care with an outside provider. Patient/Guardian was advised if they have not already done so to contact the registration department to sign all necessary forms in order for [name] to release information regarding their care.   Consent: Patient/Guardian gives verbal consent for treatment and assignment of benefits for services provided during this visit. Patient/Guardian expressed understanding and agreed to proceed.   Alonza Smoker, LCSW 02/07/2022

## 2022-03-03 ENCOUNTER — Ambulatory Visit (INDEPENDENT_AMBULATORY_CARE_PROVIDER_SITE_OTHER): Payer: 59 | Admitting: Psychiatry

## 2022-03-03 DIAGNOSIS — F319 Bipolar disorder, unspecified: Secondary | ICD-10-CM | POA: Diagnosis not present

## 2022-03-03 NOTE — Progress Notes (Signed)
Virtual Visit via Video Note  I connected with Susan Ball on 03/03/22 at 10:08 AM EST  by a video enabled telemedicine application and verified that I am speaking with the correct person using two identifiers.  Location: Patient: Home Provider: [place] office    I discussed the limitations of evaluation and management by telemedicine and the availability of in person appointments. The patient expressed understanding and agreed to proceed.   I provided 50 minutes of non-face-to-face time during this encounter.   Susan Smoker, LCSW    THERAPIST PROGRESS NOTE            Session Time: Friday  03/03/2022 10:08 AM - 10:58 AM   Participation Level: Active  Behavioral Response: CasualAlert/  Type of Therapy: Individual Therapy  Treatment Goals addressed: Susan Ball will reduce the amount of anger related incident/outburst by 50% for 4 consecutive weeks/Susan Ball will identify situations, thoughts, and feelings that trigger internal anger, angry verbal and or aggressive behavioral actions as evidenced by self recorded report  Progress on Goals: Progressing   Interventions: CBT and Supportive  Summary: Susan Ball is a 52 y.o. female who  is referred for services by Susan Ball. She just completed IOP on March 27, 2019. She has long standing history of bipolar disorder and borderline personality disorder. She has had 3 psychiatric hospitalizations. The last was in 2010 at [facility] due to suicide attempt. She participated in outpatient therapy intermittently at [facility] for 3 years. She last was seen there 6 months ago.  Current symptoms include depressed mood, crying spells, mood swings, aggression, anger, excessive worry, sleep difficulty, poor concentration, and memory difficulty.  Patient was seen via virtual visit about 3  weeks ago. Patient states doing okay since last session.  She reports decreased motivation and sometimes staying in the bed excessively.  She  cannot identify any particular triggers.  However, she still has been going to work as well as attending the gym regularly.  She reports medication still is helpful regarding managing symptoms of depression and denies having any suicidal thoughts.  However, she reports 3-4 instances of road rage since last session.  She denies having any other anger outbursts.   Suicidal/Homicidal: No, without plan, without intent.   Therapist Response:  reviewed symptoms, praised and reinforced patient's efforts to continue involvement in activity despite decreased motivation, assisted patient identify triggers of road rage, assisted patient complete an ABC behavioral analysis regarding one of the incidents of road rage, assisted patient identify ways to intervene to avoid road rage, developed plan with patient to use calm down statements/repositioning her hands on the steering wheel/deep breathing, also developed plan with patient to rehearse her plan prior to driving  plan: Return again in 2 weeks.  Diagnosis: Axis I: Bipolar Disorder    Axis II: Borderline Personality Dis.  Collaboration of Care: Psychiatrist AEB patient seeing psychiatrist Susan Ball, clinician reviewing chart.  Therapist also will follow-up regarding possible referral to intensive outpatient.  Patient/Guardian was advised Release of Information must be obtained prior to any record release in order to collaborate their care with an outside provider. Patient/Guardian was advised if they have not already done so to contact the registration department to sign all necessary forms in order for [name] to release information regarding their care.   Consent: Patient/Guardian gives verbal consent for treatment and assignment of benefits for services provided during this visit. Patient/Guardian expressed understanding and agreed to proceed.   Susan Smoker, LCSW 03/03/2022

## 2022-03-17 ENCOUNTER — Ambulatory Visit (INDEPENDENT_AMBULATORY_CARE_PROVIDER_SITE_OTHER): Payer: 59 | Admitting: Psychiatry

## 2022-03-17 DIAGNOSIS — F319 Bipolar disorder, unspecified: Secondary | ICD-10-CM | POA: Diagnosis not present

## 2022-03-17 NOTE — Progress Notes (Signed)
Virtual Visit via Video Note  I connected with Susan Ball on 03/17/22 at 10:05 AM EST by a video enabled telemedicine application and verified that I am speaking with the correct person using two identifiers.  Location: Patient: Home Provider: [place] office    I discussed the limitations of evaluation and management by telemedicine and the availability of in person appointments. The patient expressed understanding and agreed to proceed.   I provided 50 minutes of non-face-to-face time during this encounter.   Alonza Smoker, LCSW   THERAPIST PROGRESS NOTE            Session Time: Friday  03/17/2022 10:05 AM - 10:55 AM   Participation Level: Active  Behavioral Response: CasualAlert/  Type of Therapy: Individual Therapy  Treatment Goals addressed: Susan Ball will reduce the amount of anger related incident/outburst by 50% for 4 consecutive weeks/Susan Ball will identify situations, thoughts, and feelings that trigger internal anger, angry verbal and or aggressive behavioral actions as evidenced by self recorded report  Progress on Goals: Progressing   Interventions: CBT and Supportive  Summary: Susan Ball is a 52 y.o. female who  is referred for services by Dellia Nims. She just completed IOP on March 27, 2019. She has long standing history of bipolar disorder and borderline personality disorder. She has had 3 psychiatric hospitalizations. The last was in 2010 at [facility] due to suicide attempt. She participated in outpatient therapy intermittently at [facility] for 3 years. She last was seen there 6 months ago.  Current symptoms include depressed mood, crying spells, mood swings, aggression, anger, excessive worry, sleep difficulty, poor concentration, and memory difficulty.  Patient was seen via virtual visit about 3  weeks ago. Patient states having ups and downs since last session.  She reports she has been journaling and has discovered she continues to let  people take advantage of her.  She also reports more insight about her anger episodes since journaling.  Patient cites 3 examples of recent anger episodes but reports she did not have anger outburst like she normally would.  However, she expresses frustration as she became angry very quickly and reacted negatively instead of assertively.  Patient reports a common theme of thoughts of being disrespected just prior to having an anger episode.  Suicidal/Homicidal: No, without plan, without intent.   Therapist Response:  reviewed symptoms, praised and reinforced patient's efforts to journal, discussed stressors, facilitated expression of thoughts and feelings, validated feelings, processed journal entries, assisted patient identify common themes such as thoughts as well as feelings of disrespect, discussed rationale for and assisted patient practice a mindfulness activity using breath awareness to help increase present moment awareness in an effort to create a space between her triggers and reactions//responses, developed a plan with patient to practice mindfulness meditation between sessions, checked out interactive audio activity to patient and provided with access code   plan: Return again in 2 weeks.  Diagnosis: Axis I: Bipolar Disorder    Axis II: Borderline Personality Dis.  Collaboration of Care: Psychiatrist AEB patient seeing psychiatrist Dr. Adele Schilder, clinician reviewing chart.  Therapist also will follow-up regarding possible referral to intensive outpatient.  Patient/Guardian was advised Release of Information must be obtained prior to any record release in order to collaborate their care with an outside provider. Patient/Guardian was advised if they have not already done so to contact the registration department to sign all necessary forms in order for [name] to release information regarding their care.   Consent: Patient/Guardian gives  verbal consent for treatment and assignment of benefits for  services provided during this visit. Patient/Guardian expressed understanding and agreed to proceed.   Alonza Smoker, LCSW 03/17/2022

## 2022-03-20 ENCOUNTER — Other Ambulatory Visit (HOSPITAL_COMMUNITY): Payer: Self-pay | Admitting: Psychiatry

## 2022-03-20 DIAGNOSIS — F6089 Other specific personality disorders: Secondary | ICD-10-CM

## 2022-03-20 DIAGNOSIS — F319 Bipolar disorder, unspecified: Secondary | ICD-10-CM

## 2022-03-22 ENCOUNTER — Encounter (INDEPENDENT_AMBULATORY_CARE_PROVIDER_SITE_OTHER): Payer: Self-pay

## 2022-03-23 ENCOUNTER — Encounter: Payer: Self-pay | Admitting: Radiology

## 2022-03-28 ENCOUNTER — Telehealth (HOSPITAL_COMMUNITY): Payer: Self-pay | Admitting: *Deleted

## 2022-03-28 NOTE — Telephone Encounter (Signed)
Please clarify the message.  There is no documentation just routed to me.

## 2022-03-31 ENCOUNTER — Ambulatory Visit (INDEPENDENT_AMBULATORY_CARE_PROVIDER_SITE_OTHER): Payer: 59 | Admitting: Psychiatry

## 2022-03-31 DIAGNOSIS — F319 Bipolar disorder, unspecified: Secondary | ICD-10-CM

## 2022-03-31 NOTE — Progress Notes (Signed)
Virtual Visit via Video Note  I connected with [name] on 03/31/22 at 10:00 AM EST by a video enabled telemedicine application and verified that I am speaking with the correct person using two identifiers.  Location: Patient: Car Provider: [place] office    I discussed the limitations of evaluation and management by telemedicine and the availability of in person appointments. The patient expressed understanding and agreed to proceed.  I provided 45 minutes of non-face-to-face time during this encounter.   Alonza Smoker, LCSW   THERAPIST PROGRESS NOTE            Session Time: Friday  03/31/2022 10:00 AM -  10:45 AM   Participation Level: Active  Behavioral Response: CasualAlert/  Type of Therapy: Individual Therapy  Treatment Goals addressed: [name] will reduce the amount of anger related incident/outburst by 50% for 4 consecutive weeks/Anastazja will identify situations, thoughts, and feelings that trigger internal anger, angry verbal and or aggressive behavioral actions as evidenced by self recorded report  Progress on Goals: Progressing   Interventions: CBT and Supportive  Summary: IMPI FICHTNER is a 52 y.o. female who  is referred for services by Dellia Nims. She just completed IOP on March 27, 2019. She has long standing history of bipolar disorder and borderline personality disorder. She has had 3 psychiatric hospitalizations. The last was in 2010 at [facility] due to suicide attempt. She participated in outpatient therapy intermittently at [facility] for 3 years. She last was seen there 6 months ago.  Current symptoms include depressed mood, crying spells, mood swings, aggression, anger, excessive worry, sleep difficulty, poor concentration, and memory difficulty.  Patient was seen via virtual visit about 3  weeks ago. Patient reports increased stress and anxiety as her husband had a stroke this past Sunday.  He initially was in ICU but now has been  transferred to a regular room.  She reports husband's right side was affected by the stroke.  He can move his legs and was able to walk about 25 steps from working with hospital staff earlier this week.  However, patient expresses concern about husband regaining his normal ability to walk.  She also reports he cannot use his right hand.  However, he is left-hand dominant and can still use this hand.  Patient reports he starts intensive inpatient physical therapy today.  She reports she became overwhelmed but used coping statements to manage.  She reports fatigue and irritability as she has been at the hospital with her husband since Sunday except for taking a 2-hour break to go home and shower.  She spends the night at the hospital and reports poor sleep.  Patient reports using other helpful coping strategies such as playing games on her tablet and using her spirituality by praying to help cope with stress.  She also reports contacting her pastor, her mother-in-law, and others who are praying for her and her husband.   Suicidal/Homicidal: No, without plan, without intent.   Therapist Response:  reviewed symptoms, discussed stressors, facilitated expression of thoughts and feelings, validated feelings, praised and reinforced patient's efforts to use helpful coping strategies, discussed the role of trying to improve her self-care in order to continue being there for her husband, assisted patient identify ways to improve self-care around sleeping, discussed giving self permission to try to get some rest, developed plan with patient to try to stay home overnight 1 night to try to get more sleep, discussed ways to adjust her schedule regarding being at  the hospital, assisted patient identify and address thoughts and processes that may inhibit implementation of plan, encouraged patient to use her support system.  plan: Return again in 2 weeks.  Diagnosis: Axis I: Bipolar Disorder    Axis II: Borderline  Personality Dis.  Collaboration of Care: Psychiatrist AEB patient seeing psychiatrist Dr. Adele Schilder, clinician reviewing chart.    Patient/Guardian was advised Release of Information must be obtained prior to any record release in order to collaborate their care with an outside provider. Patient/Guardian was advised if they have not already done so to contact the registration department to sign all necessary forms in order for [name] to release information regarding their care.   Consent: Patient/Guardian gives verbal consent for treatment and assignment of benefits for services provided during this visit. Patient/Guardian expressed understanding and agreed to proceed.   Alonza Smoker, LCSW 03/31/2022

## 2022-04-06 IMAGING — US US ABDOMEN LIMITED
2 series · 14 of 25 positions shown · non-contrast
Comparison: None.

CLINICAL DATA: Abnormal liver function tests.

EXAM:
ULTRASOUND ABDOMEN LIMITED RIGHT UPPER QUADRANT

[Series 1: us abdomen limited · 0.28mm/px · 13 of 51 slices shown (1 of 2)]
[im 1/51]
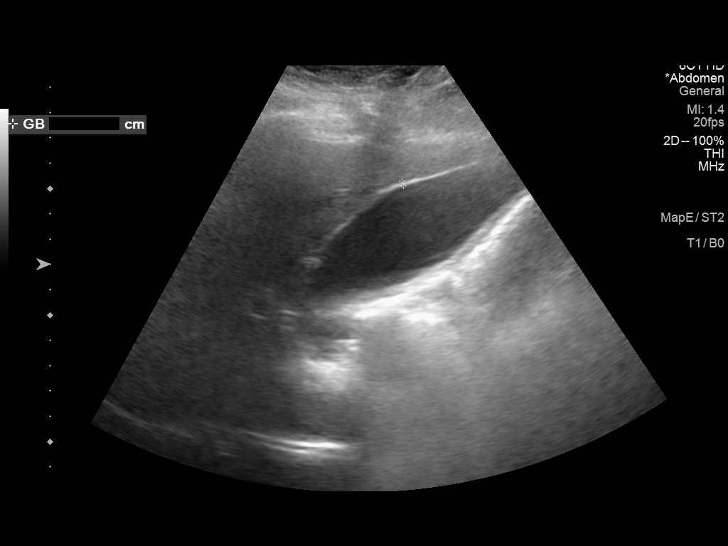
[im 5/51]
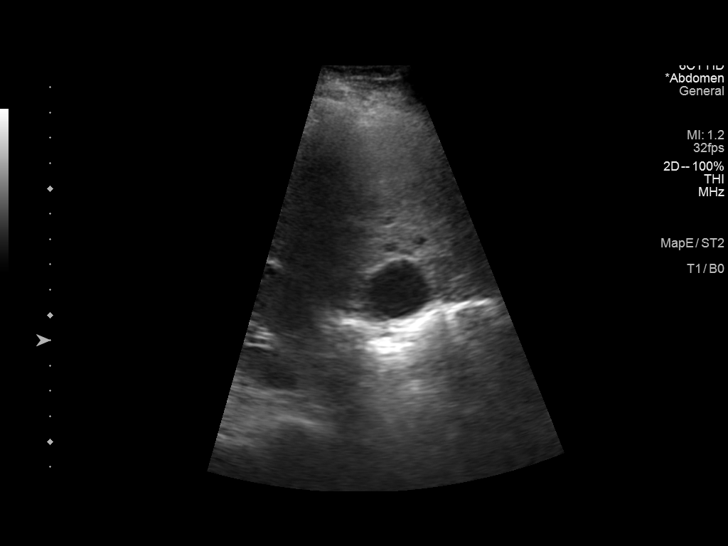
[im 10/51]
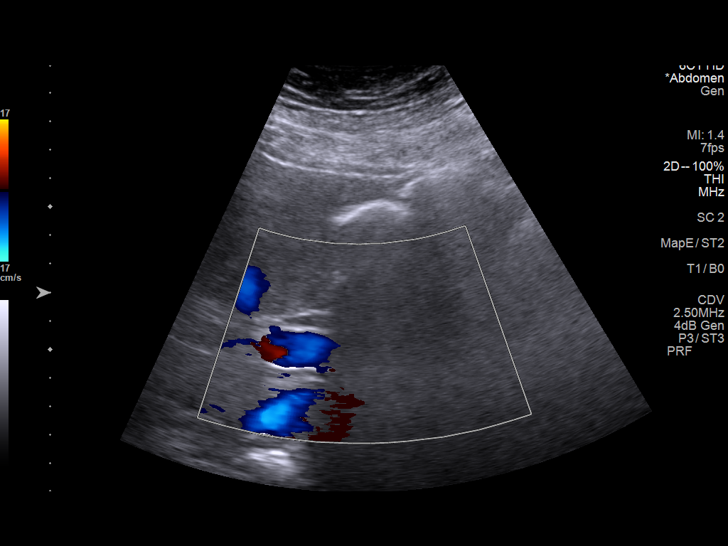
[im 14/51]
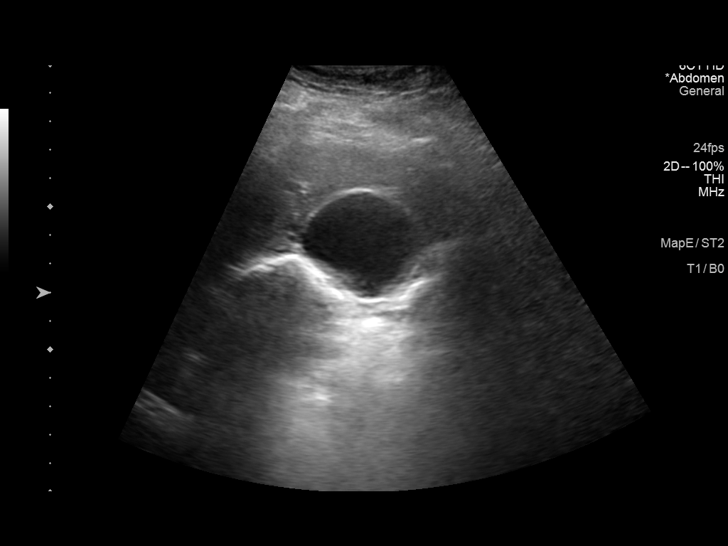
[im 19/51]
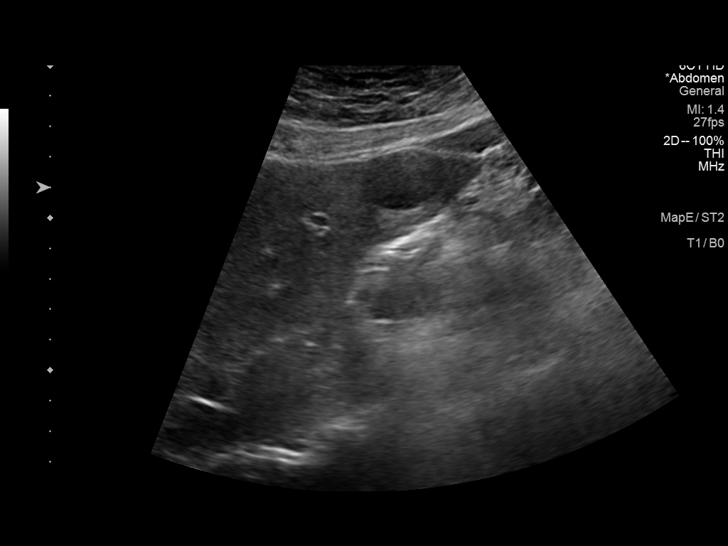
[im 21/51]
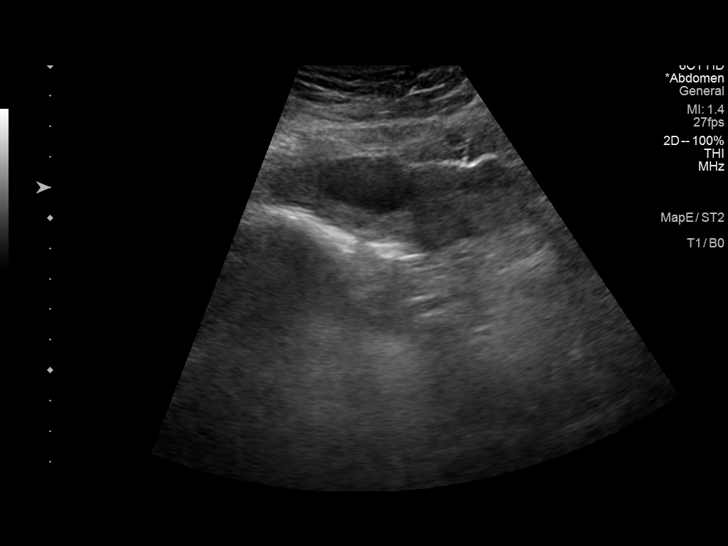
[im 26/51]
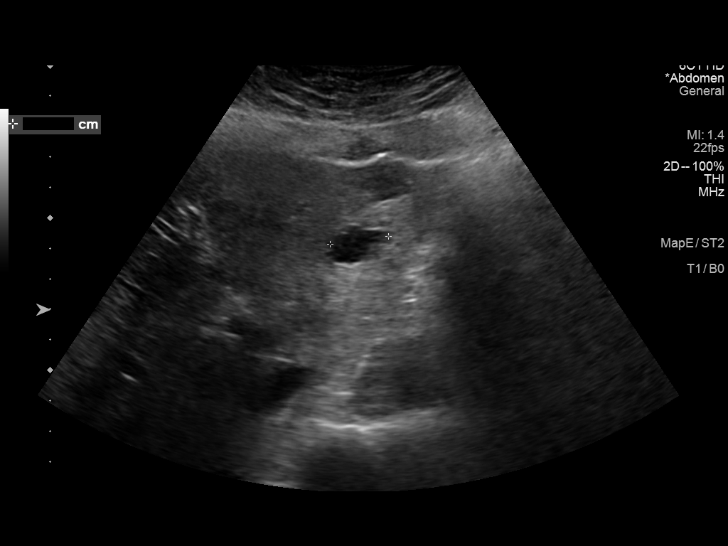
[im 30/51]
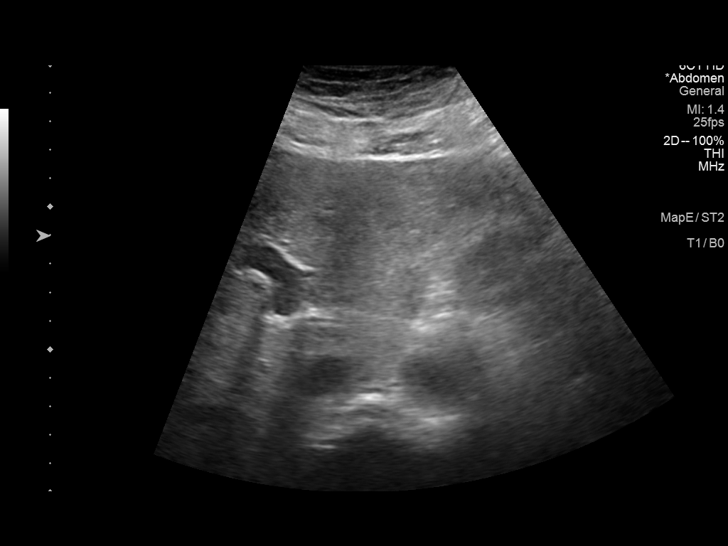
[im 35/51]
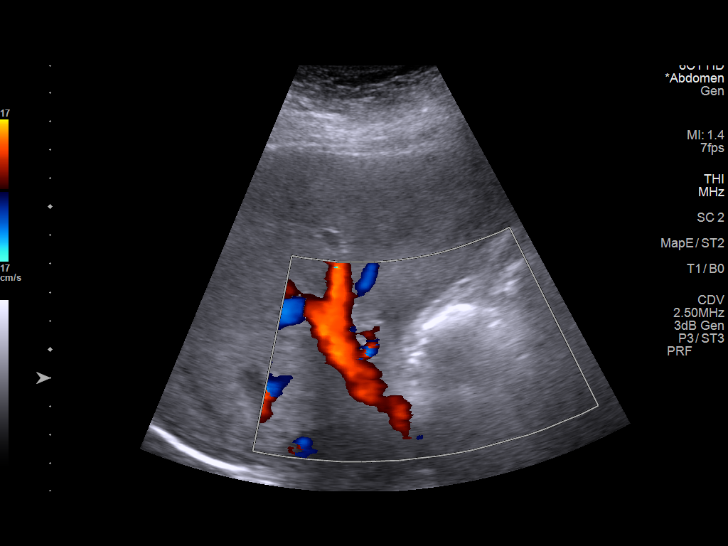
[im 37/51]
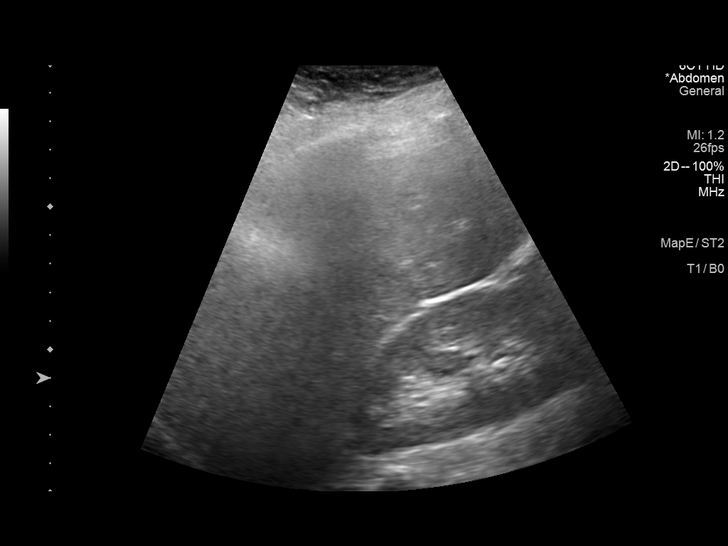
[im 41/51]
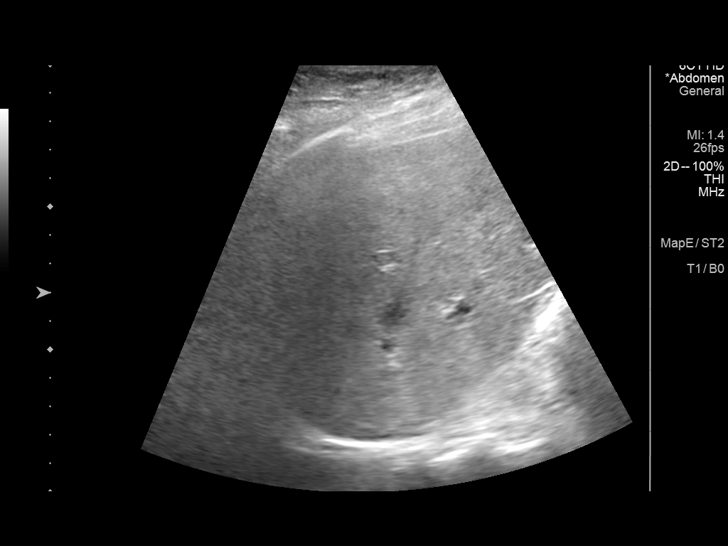
[im 46/51]
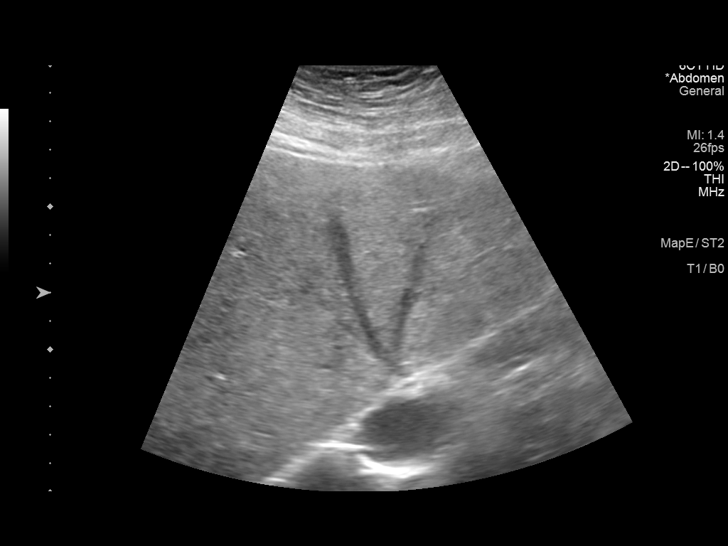
[im 51/51]
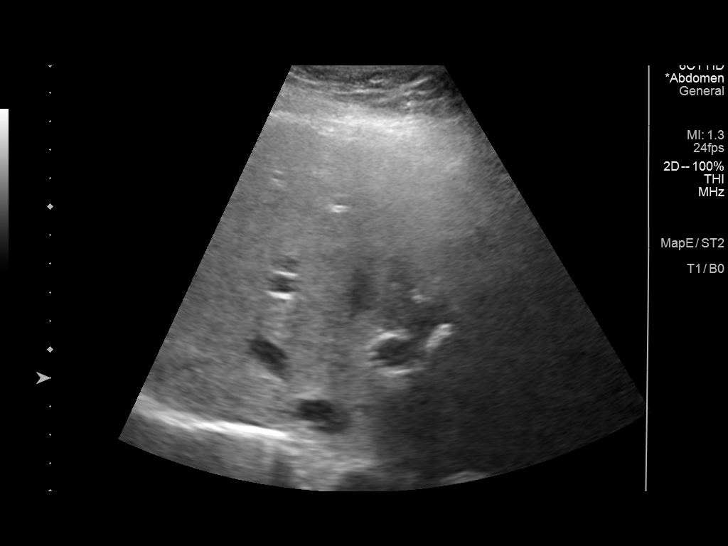

[Series 2001: us abdomen limited · 0.23mm/px · 1 of 4 slices shown (2 of 2)]
[im 4/4]
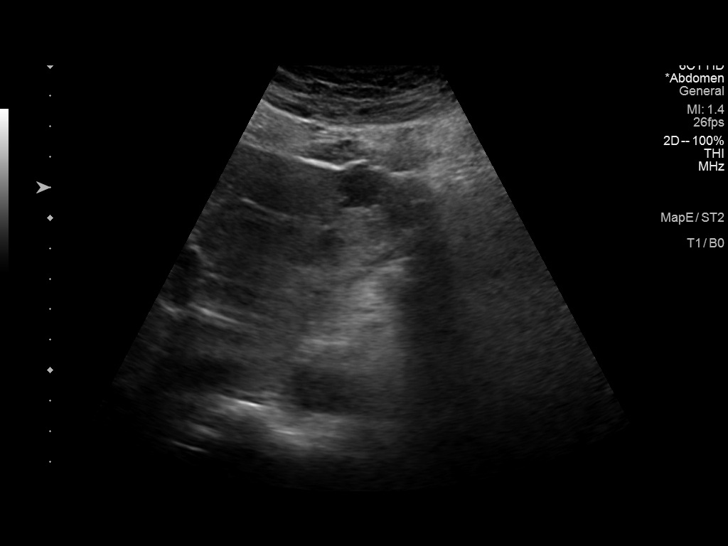

[14 of 25 positions shown; findings below may reference images not displayed]

FINDINGS: Gallbladder:

No gallstones or wall thickening visualized (2.0 mm). No sonographic
Murphy sign noted by sonographer.

Common bile duct:

Diameter: 3.2 mm

Liver:

A 2.6 cm x 1.9 cm x 3.0 cm complex hypoechoic area is seen within
the left lobe of the liver. Additional 1.2 cm x 1.2 cm x 1.9 cm and
1.9 cm x 1.0 cm x 1.3 cm similar appearing areas are seen within the
left lobe. No abnormal flow is noted within these regions on color
Doppler evaluation. Diffusely increased echogenicity of the liver
parenchyma is noted. Portal vein is patent on color Doppler imaging
with normal direction of blood flow towards the liver.

Other: None.
IMPRESSION: 1. Hepatic steatosis.
2. Multiple complex hepatic cysts. While these areas are likely
benign in etiology, correlation with hepatic protocol CT or MRI is
recommended to better characterize these lesions.

## 2022-04-28 ENCOUNTER — Ambulatory Visit (INDEPENDENT_AMBULATORY_CARE_PROVIDER_SITE_OTHER): Payer: 59 | Admitting: Psychiatry

## 2022-04-28 DIAGNOSIS — F319 Bipolar disorder, unspecified: Secondary | ICD-10-CM | POA: Diagnosis not present

## 2022-04-28 NOTE — Progress Notes (Signed)
Virtual Visit via Video Note  I connected with Susan Ball on 04/28/22 at 9:05 AM EDT  by a video enabled telemedicine application and verified that I am speaking with the correct person using two identifiers.  Location: Patient: Home Provider[facility] [place] [facility]    I discussed the limitations of evaluation and management by telemedicine and the availability of in person appointments. The patient expressed understanding and agreed to proceed.  I provided 47 minutes of non-face-to-face time during this encounter.   Adah Salvageeggy E Juluis Fitzsimmons, LCSW   THERAPIST PROGRESS NOTE       Session Time: Friday  04/28/2022 9:05 AM - 9:52 AM   Participation Level: Active  Behavioral Response: CasualAlert/  Type of Therapy: Individual Therapy  Treatment Goals addressed: Susan Ball will reduce the amount of anger related incident/outburst by 50% for 4 consecutive weeks/Florrie will identify situations, thoughts, and feelings that trigger internal anger, angry verbal and or aggressive behavioral actions as evidenced by self recorded report  Progress on Goals: Progressing   Interventions: CBT and Supportive  Summary: Susan Ball is a 52 y.o. female who  is referred for services by Jeri Modenaita Clark. She just completed IOP on March 27, 2019. She has long standing history of bipolar disorder and borderline personality disorder. She has had 3 psychiatric hospitalizations. [facility] due to [facility]. She participated in outpatient [facility] for 3 years. She last was seen there 6 months ago.  Current symptoms include depressed mood, crying spells, mood swings, aggression, anger, excessive worry, sleep difficulty, poor concentration, and memory difficulty.  Patient was seen via virtual visit about 4 weeks ago. Patient reports continued stress and anxiety regarding her husband's recovery from her stroke.  Per her report, he was discharged from the  hospital about a week ago.  Patient expresses frustration husband is not practicing the exercises he did while he was in rehab as instructed.  He also is not performing self-care activities he is capable of performing.  Per patient's report, he is constantly asking her to get something.  She reports trying to talk with husband about performing the exercises as instructed but he becomes frustrated and angry.  Patient reports fatigue and having little time for self.  [facility] and said this was helpful.  She also does errands but reports feeling rushed all the time reverting to aggressive driving.  However, patient reports recently recognizing aggressive driving and used self talk to calm self.  Patient fears husband may try to walk and fall when she is not home.  Suicidal/Homicidal: No, without plan, without intent.   Therapist Response:  reviewed symptoms, discussed stressors, facilitated expression of thoughts and feelings, validated feelings, assisted patient identify realistic expectations of husband as well as realistic expectations of self, assisted patient identify her pattern of interaction with husband, assisted patient identify ways to be assertive and set/maintain limits with husband while also being supportive, assisted patient identify her support system and ways to use, praised and reinforced patient's efforts to [facility], assisted patient identify ways to increase frequency/consistency within her capability given husband's current situation, praised and reinforced patient's recognition of aggressive driving and her use of self talk to calm self, reviewed rationale for and develop plan with patient to practice techniques (deep breathing, progressive muscle relaxation) to trigger relaxation response.   plan: Return again in 2 weeks.  Diagnosis: Axis I: Bipolar Disorder  Axis II: Borderline Personality Dis.  Collaboration of Care: Psychiatrist AEB patient seeing  psychiatrist Dr. Lolly Mustache, clinician reviewing chart.    Patient/Guardian was advised Release of Information must be obtained prior to any record release in order to collaborate their care with an outside provider. Patient/Guardian was advised if they have not already done so to contact the registration department to sign all necessary forms in order for [name] to release information regarding their care.   Consent: Patient/Guardian gives verbal consent for treatment and assignment of benefits for services provided during this visit. Patient/Guardian expressed understanding and agreed to proceed.   [name], LCSW 04/28/2022

## 2022-05-05 ENCOUNTER — Telehealth (HOSPITAL_BASED_OUTPATIENT_CLINIC_OR_DEPARTMENT_OTHER): Payer: 59 | Admitting: Psychiatry

## 2022-05-05 ENCOUNTER — Encounter (HOSPITAL_COMMUNITY): Payer: Self-pay | Admitting: Psychiatry

## 2022-05-05 DIAGNOSIS — F4310 Post-traumatic stress disorder, unspecified: Secondary | ICD-10-CM | POA: Diagnosis not present

## 2022-05-05 DIAGNOSIS — F319 Bipolar disorder, unspecified: Secondary | ICD-10-CM

## 2022-05-05 DIAGNOSIS — F6089 Other specific personality disorders: Secondary | ICD-10-CM

## 2022-05-05 DIAGNOSIS — F419 Anxiety disorder, unspecified: Secondary | ICD-10-CM

## 2022-05-05 MED ORDER — FLUOXETINE HCL 20 MG PO CAPS: 20.0000 mg | ORAL_CAPSULE | Freq: Every day | ORAL | 0 refills | Status: DC

## 2022-05-05 MED ORDER — LITHIUM CARBONATE ER 450 MG PO TBCR: EXTENDED_RELEASE_TABLET | ORAL | 1 refills | Status: DC

## 2022-05-05 MED ORDER — CLONIDINE HCL 0.2 MG PO TABS: 0.2000 mg | ORAL_TABLET | Freq: Every day | ORAL | 0 refills | Status: DC

## 2022-05-05 MED ORDER — VORTIOXETINE HBR 20 MG PO TABS: 20.0000 mg | ORAL_TABLET | Freq: Every day | ORAL | 0 refills | Status: DC

## 2022-05-05 NOTE — Progress Notes (Signed)
[facility] MD Virtual Progress Note   Patient Location: Home Provider Location: Home Office  I connect with patient by telephone and verified that I am speaking with correct person by using two identifiers. I discussed the limitations of evaluation and management by telemedicine and the availability of in person appointments. I also discussed with the patient that there may be a patient responsible charge related to this service. The patient expressed understanding and agreed to proceed.  Susan Ball [id number] 52 y.o.  05/05/2022 10:11 AM  History of Present Illness:  Patient is evaluated by phone session.  She admitted past few weeks he is not going very well.  Her 24 year old husband has a stroke with right-sided weakness and she was very busy taking care of her husband.  She admitted not taking the medication while husband was in the hospital she just started taking the medication as patient has been back in the home.  She reported when not taking the medication she has a lot of irritability, anger, impulsive behavior with excessive eating and mood swings.  She was having paranoia, crying spells and nightmares.  Now she is back on medication slowly and gradually she feels it is helping.  She has suicidal thoughts a few weeks ago which subsided now.  She admitted weight loss when she was taking medication but started to have impulsive eating and gained weight which she had lost.  She is trying to be more healthy eater.  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  She is in therapy with [name] but sometime she like to see her more frequently but therapy schedule is very busy.  She has no tremors, shakes or any EPS.  She is taking clonidine, Prozac, lithium and Trintellix.  Her son is in the process of joining the [place] and lives in [place].  She does talk to him on a daily basis.  Patient not involved in any self-abusive behavior.  She has few  panic attacks but realizes it was due to noncompliance with medication.  She denies any hallucination or any suicidal thoughts.  Past Psychiatric History: H/O Bipolar, anxiety and borderline traits. On meds since age 4.  H/O inpatient, suicidal attempt, overdose, jump from the car and banging head.  Tried Lamictal (rash), Seroquel, Abilify, doxepin, Trazodone, Vraylar, olanzapine and hydroxyzine. H/O n/c with meds, refusing to eat and needed PICC line. H/O anger, road rage, paranoia and trust issues. Had DBT with good response. Genesight test shows Pristiq, Fetzima and Viibryd, lithium, tegretol and Valproic in better column.     Outpatient Encounter Medications as of 05/05/2022  Medication Sig   albuterol (VENTOLIN HFA) 108 (90 Base) MCG/ACT inhaler SMARTSIG:2 Puff(s) By Mouth 4 Times Daily PRN   calcium carbonate (OS-CAL - DOSED IN MG OF ELEMENTAL CALCIUM) 1250 (500 Ca) MG tablet Take 1 tablet by mouth daily with breakfast.    Cholecalciferol 25 MCG (1000 UT) capsule Take 1,000 Units by mouth daily.    cloNIDine (CATAPRES) 0.2 MG tablet Take 1 tablet (0.2 mg total) by mouth at bedtime.   Cyanocobalamin (B-12) 5000 MCG SUBL See admin instructions.   Ferrous Sulfate (SLOW FE) 142 (45 Fe) MG TBCR 1 tablet   FLUoxetine (PROZAC) 20 MG capsule Take 1 capsule (20 mg total) by mouth daily.   fluticasone (FLONASE) 50 MCG/ACT nasal spray 2 sprays   lithium carbonate (ESKALITH) 450 MG ER tablet Take one tab (300 mg total) twice a day.  naproxen (NAPROSYN) 500 MG tablet Take 1 tablet (500 mg total) by mouth 2 (two) times daily as needed.   OLANZapine (ZYPREXA) 5 MG tablet Take 1 tablet (5 mg total) by mouth at bedtime. (Patient not taking: Reported on 05/10/2021)   pantoprazole (PROTONIX) 40 MG tablet SMARTSIG:1 Tablet(s) By Mouth Morning-Evening   solifenacin (VESICARE) 5 MG tablet Take 5 mg by mouth daily.   tiZANidine (ZANAFLEX) 4 MG tablet Take 1 tablet (4 mg total) by mouth every 8 (eight) hours as  needed for muscle spasms. Do not take with alcohol or while driving or operating heavy machinery.  May cause drowsiness.   Trimethoprim HCl (TRIMPEX PO) Take by mouth.   vortioxetine HBr (TRINTELLIX) 20 MG TABS tablet Take 1 tablet (20 mg total) by mouth daily.   No facility-administered encounter medications on file as of 05/05/2022.    Recent Results (from the past 2160 hour(s))  Lithium level     Status: None   Collection Time: 02/06/22  3:17 PM  Result Value Ref Range   Lithium Lvl 0.8 0.5 - 1.2 mmol/L    Comment: A concentration of 0.5-0.8 mmol/L is advised for long-term use; concentrations of up to 1.2 mmol/L may be necessary during acute treatment.                                  Detection Limit = 0.1                           <0.1 indicates None Detected      Psychiatric Specialty Exam: Physical Exam  Review of Systems  Weight 258 lb (117 kg).There is no height or weight on file to calculate BMI.  General Appearance: NA  Eye Contact:  NA  Speech:  Slow  Volume:  Decreased  Mood:  Anxious  Affect:  NA  Thought Process:  Descriptions of Associations: Intact  Orientation:  Full (Time, Place, and Person)  Thought Content:  Rumination  Suicidal Thoughts:  No  Homicidal Thoughts:  No  Memory:  Immediate;   Good Recent;   Fair Remote;   Fair  Judgement:  Fair  Insight:  Shallow  Psychomotor Activity:  Decreased  Concentration:  Concentration: Fair and Attention Span: Fair  Recall:  Good  Fund of Knowledge:  Good  Language:  Good  Akathisia:  No  Handed:  Right  AIMS (if indicated):     Assets:  Communication Skills Desire for Improvement Housing Transportation  ADL's:  Intact  Cognition:  WNL  Sleep:  fair     Assessment/Plan: Cluster B personality disorder - Plan: cloNIDine (CATAPRES) 0.2 MG tablet, lithium carbonate (ESKALITH) 450 MG ER tablet  Anxiety - Plan: cloNIDine (CATAPRES) 0.2 MG tablet, FLUoxetine (PROZAC) 20 MG capsule, vortioxetine HBr  (TRINTELLIX) 20 MG TABS tablet  Bipolar I disorder - Plan: cloNIDine (CATAPRES) 0.2 MG tablet, FLUoxetine (PROZAC) 20 MG capsule, lithium carbonate (ESKALITH) 450 MG ER tablet, vortioxetine HBr (TRINTELLIX) 20 MG TABS tablet  PTSD (post-traumatic stress disorder) - Plan: FLUoxetine (PROZAC) 20 MG capsule  Discussed psychosocial stressors.  Patient admitted increased anxiety as 8 year old husband had a stroke with right-sided weakness and she is very busy taking care of him.  Reassurance given.  Since back on medication she is doing better and her nightmares, panic attack and impulsivity is less intense.  Encouraged to continue medication and discussed importance of compliance.  I also encouraged to try to consider seeing therapist if appointments are available more frequently.  For now we will continue clonidine 0.2 mg at bedtime, Prozac 20 mg daily, Trintellix 20 mg daily lithium 450 mg 2 times a day.  We will follow-up in 6 weeks and if symptoms still not better then we will optimize the dose.  Patient agreed with the plan.  I recommend call us back if she feels worsening of symptoms or any time having suicidal thoughts or homicidal thoughts then go to the local emergency room.   Follow Up Instructions:     I discussed the assessment and treatment plan with the patient. The patient was provided an opportunity to ask questions and all were answered. The patient agreed with the plan and demonstrated an understanding of the instructions.   The patient was advised to call back or seek an in-person evaluation if the symptoms worsen or if the condition fails to improve as anticipated.    Collaboration of Care: Other provider involved in patient's care AEB notes are available in epic to review.  Patient/Guardian was advised Release of Information must be obtained prior to any record release in order to collaborate their care with an outside provider. Patient/Guardian was advised if they have not  already done so to contact the registration department to sign all necessary forms in order for [name] to release information regarding their care.   Consent: Patient/Guardian gives verbal consent for treatment and assignment of benefits for services provided during this visit. Patient/Guardian expressed understanding and agreed to proceed.     I provided 28 minutes of non face to face time during this encounter.  Note: This document was prepared by [employer] voice dictation technology and any errors that results from this process are unintentional.    Cleotis Nipper, MD 05/05/2022

## 2022-05-12 ENCOUNTER — Ambulatory Visit (INDEPENDENT_AMBULATORY_CARE_PROVIDER_SITE_OTHER): Payer: 59 | Admitting: Psychiatry

## 2022-05-12 DIAGNOSIS — F603 Borderline personality disorder: Secondary | ICD-10-CM

## 2022-05-12 DIAGNOSIS — F319 Bipolar disorder, unspecified: Secondary | ICD-10-CM | POA: Diagnosis not present

## 2022-05-12 NOTE — Progress Notes (Signed)
Virtual Visit via Video Note  I connected with Susan Ball on 05/12/22 at  9:00 AM EDT by a video enabled telemedicine application and verified that I am speaking with the correct person using two identifiers.  Location: Patient: Home Provider: [facility]  office    I discussed the limitations of evaluation and management by telemedicine and the availability of in person appointments. The patient expressed understanding and agreed to proceed.  I provided 52 minutes of non-face-to-face time during this encounter.   Susan Salvage, LCSW   THERAPIST PROGRESS NOTE       Session Time: Friday  05/12/2022 9:05 AM - 9:57 AM   Participation Level: Active  Behavioral Response: CasualAlert/  Type of Therapy: Individual Therapy  Treatment Goals addressed: [name] will reduce the amount of anger related incident/outburst by 50% for 4 consecutive weeks/Joeann will identify situations, thoughts, and feelings that trigger internal anger, angry verbal and or aggressive behavioral actions as evidenced by self recorded report  Progress on Goals: Progressing   Interventions: CBT and Supportive  Summary: Susan Ball is a 52 y.o. female who  is referred for services by [name]. She just completed IOP on March 27, 2019. She has long standing history of bipolar disorder and borderline personality disorder. She has had 3 psychiatric hospitalizations. The last was in 2010 at [facility] due to suicide attempt. She participated in outpatient therapy intermittently at [facility] for 3 years. She last was seen there 6 months ago.  Current symptoms include depressed mood, crying spells, mood swings, aggression, anger, excessive worry, sleep difficulty, poor concentration, and memory difficulty.  Patient was seen via virtual visit about 2 weeks ago. Patient reports continued stress and anxiety regarding her husband's recovery from her stroke.  Per her report, he still does not practicing  exercises he did while he was in rehab as instructed.  Patient states feeling rushed all the time and expresses frustration she has little to no time for self.  She also expresses frustration as she states other family members do not understand her situation and still have the same expectations from her they had prior to her husband's stroke.  She reports difficulty saying no and setting/maintaining limits with family members.  Also reports difficulty asking for help as she has thoughts of down as anyone for anything so you will not owe them.  Patient reports constant thoughts of something happening to her husband when she is away from home and fears that she would be blamed if something did happen to her husband and she was not home.  Patient reports increased irritability, fatigue, and emotional outbursts.  Patient reports medication compliance.  Suicidal/Homicidal: Patient reports she has experienced passive SI but denies any plan and any intent, she agrees to call 911, [phone], or have someone take her to the ED should symptoms worsen   Therapist Response:  reviewed symptoms, discussed stressors, facilitated expression of thoughts and feelings, validated feelings, discussed possibility of patient obtaining a medical alert necklace/bracelet for her husband to help decrease anxiety about husband possibly falling when she is away from home, continued to assist patient identify realistic expectations of husband as well as realistic expectations of self, reviewed patient's pattern of interaction with her husband, assisted patient identify ways to be assertive and set/maintain limits with husband while also being supportive, assisted patient identify/challenge/and replace unhelpful thoughts about setting and maintaining limits with other family members, will send patient copy of basic personal rights to review statements to promote  more effective assertion, began to assist patient identify and challenge thoughts about  asking for help, praised and reinforced patient's efforts to go to the [facility], assisted patient identify realistic expectations regarding her workout routine and time available.  plan: Return again in 2 weeks.  Diagnosis: Axis I: Bipolar Disorder    Axis II: Borderline Personality Dis.  Collaboration of Care: Psychiatrist AEB patient seeing psychiatrist Dr. Lolly Mustache, clinician reviewing chart.    Patient/Guardian was advised Release of Information must be obtained prior to any record release in order to collaborate their care with an outside provider. Patient/Guardian was advised if they have not already done so to contact the registration department to sign all necessary forms in order for [name] to release information regarding their care.   Consent: Patient/Guardian gives verbal consent for treatment and assignment of benefits for services provided during this visit. Patient/Guardian expressed understanding and agreed to proceed.   Susan Salvage, LCSW 05/12/2022

## 2022-05-23 ENCOUNTER — Ambulatory Visit
Admission: EM | Admit: 2022-05-23 | Discharge: 2022-05-23 | Disposition: A | Discharge status: Home / Self Care | Payer: 59 | Attending: Family Medicine | Admitting: Family Medicine

## 2022-05-23 DIAGNOSIS — R350 Frequency of micturition: Secondary | ICD-10-CM

## 2022-05-23 LAB — POCT URINALYSIS DIP (MANUAL ENTRY)
Bilirubin, UA: NEGATIVE
Glucose, UA: NEGATIVE mg/dL
Ketones, POC UA: NEGATIVE mg/dL
Leukocytes, UA: NEGATIVE
Nitrite, UA: NEGATIVE
Protein Ur, POC: NEGATIVE mg/dL
Spec Grav, UA: 1.02 (ref 1.010–1.025)
Urobilinogen, UA: 0.2 E.U./dL
pH, UA: 7 (ref 5.0–8.0)

## 2022-05-23 LAB — POCT FASTING CBG KUC MANUAL ENTRY: POCT Glucose (KUC): 83 mg/dL (ref 70–99)

## 2022-05-23 MED ORDER — CEPHALEXIN 500 MG PO CAPS: 500.0000 mg | ORAL_CAPSULE | Freq: Two times a day (BID) | ORAL | 0 refills | Status: DC

## 2022-05-23 MED ORDER — FLUTICASONE PROPIONATE 50 MCG/ACT NA SUSP: NASAL | 2 refills | Status: AC

## 2022-05-23 NOTE — Discharge Instructions (Signed)
You have had la lab (urine culture) sent today. We will call you with any significant abnormalities or if there is need to begin or change treatment or pursue further follow up.  You may also review your test results online through MyChart. If you do not have a MyChart account, instructions to sign up should be on your discharge paperwork.

## 2022-05-23 NOTE — ED Triage Notes (Signed)
Pt presents with urinary frequency and back pain X 2 weeks.

## 2022-05-23 NOTE — ED Provider Notes (Signed)
  [facility]    ASSESSMENT & PLAN:  1. Urinary frequency    Will treat for UTI. Begin: Meds ordered this encounter  Medications   cephALEXin (KEFLEX) 500 MG capsule    Sig: Take 1 capsule (500 mg total) by mouth 2 (two) times daily.    Dispense:  10 capsule    Refill:  0   Urine culture sent. Will notify patient of any significant results. Will follow up with her PCP or here if not showing improvement over the next 48 hours, sooner if needed.  Outlined signs and symptoms indicating need for more acute intervention. Patient verbalized understanding. After Visit Summary given.  SUBJECTIVE:  Susan Ball is a 52 y.o. female who complains of urinary frequency and urgency for the past 2 weeks; questions worse over past few days. Without associated flank pain, fever, chills, vaginal discharge or bleeding. Gross hematuria: not present. No specific aggravating or alleviating factors reported. No LE edema. Normal PO intake without n/v/d. Without specific abdominal pain. Ambulatory without difficulty. No tx PTA.  LMP: No LMP recorded. Patient is premenopausal.  OBJECTIVE:  Vitals:   05/23/22 1541  BP: 122/65  Pulse: 62  Resp: 18  Temp: 98.2 F (36.8 C)  TempSrc: Oral  SpO2: 95%   General appearance: alert; no distress HENT: oropharynx: moist Lungs: unlabored respirations Abdomen: soft Back: no CVA tenderness Extremities: no edema; symmetrical with no gross deformities Skin: warm and dry Neurologic: normal gait Psychological: alert and cooperative; normal mood and affect  Labs Reviewed  POCT URINALYSIS DIP (MANUAL ENTRY) - Abnormal; Notable for the following components:      Result Value   Blood, UA moderate (*)    All other components within normal limits  URINE CULTURE  POCT FASTING CBG K[facility] MANUAL ENTRY    Allergies  Allergen Reactions   Fexofenadine Other (See Comments)   Lamictal [Lamotrigine]     Body rash     Past Medical History:   Diagnosis Date   Anxiety    Bipolar disorder (HCC)    Depression    History of borderline personality disorder    Social History   Socioeconomic History   Marital status: Unknown    Spouse name: Not on file   Number of children: 1   Years of education: Not on file   Highest education level: Not on file  Occupational History   Not on file  Tobacco Use   Smoking status: Never   Smokeless tobacco: Never  Vaping Use   Vaping Use: Never used  Substance and Sexual Activity   Alcohol use: Not Currently   Drug use: Not Currently    Types: Marijuana    Comment: States she stopped smoking THC in 2002   Sexual activity: Yes  Other Topics Concern   Not on file  Social History Narrative   Not on file   Social Determinants of Health   Financial Resource Strain: Not on file  Food Insecurity: Not on file  Transportation Needs: Not on file  Physical Activity: Not on file  Stress: Not on file  Social Connections: Not on file  Intimate Partner Violence: Not on file   Family History  Problem Relation Age of Onset   Depression Sister    Depression Brother         Mardella Layman, MD 05/23/22 [phone]

## 2022-05-24 LAB — URINE CULTURE: Culture: NO GROWTH

## 2022-05-26 ENCOUNTER — Ambulatory Visit (HOSPITAL_COMMUNITY): Payer: 59 | Admitting: Psychiatry

## 2022-05-29 ENCOUNTER — Ambulatory Visit (INDEPENDENT_AMBULATORY_CARE_PROVIDER_SITE_OTHER): Payer: 59 | Admitting: Psychiatry

## 2022-05-29 DIAGNOSIS — F6089 Other specific personality disorders: Secondary | ICD-10-CM | POA: Diagnosis not present

## 2022-05-29 DIAGNOSIS — F319 Bipolar disorder, unspecified: Secondary | ICD-10-CM | POA: Diagnosis not present

## 2022-05-29 NOTE — Progress Notes (Signed)
Virtual Visit via Video Note  I connected with Susan Ball on 05/29/22 at 9:08 AM EDT  by a video enabled telemedicine application and verified that I am speaking with the correct person using two identifiers.  Location: Patient: Car Provider: [facility] [place] office    I discussed the limitations of evaluation and management by telemedicine and the availability of in person appointments. The patient expressed understanding and agreed to proceed.  I provided 40 minutes of non-face-to-face time during this encounter.   [name], LCSW    THERAPIST PROGRESS NOTE       Session Time: Monday 05/29/2022 9:08 AM - 9;48 AM   Participation Level: Active  Behavioral Response: CasualAlert/  Type of Therapy: Individual Therapy  Treatment Goals addressed: Susan Ball will reduce the amount of anger related incident/outburst by 50% for 4 consecutive weeks/Susan Ball will identify situations, thoughts, and feelings that trigger internal anger, angry verbal and or aggressive behavioral actions as evidenced by self recorded report  Progress on Goals: Progressing   Interventions: CBT and Supportive  Summary: Susan Ball is a 52 y.o. female who  is referred for services by [name]. She just completed IOP on March 27, 2019. She has long standing history of bipolar disorder and borderline personality disorder. She has had 3 psychiatric hospitalizations. The last was in 2010 at [facility] due to suicide attempt. She participated in outpatient therapy intermittently at [facility] for 3 years. She last was seen there 6 months ago.  Current symptoms include depressed mood, crying spells, mood swings, aggression, anger, excessive worry, sleep difficulty, poor concentration, and memory difficulty.  Patient was seen via virtual visit about 2 weeks ago. She reports continued stress and anxiety regarding her husband's recovery from a stroke.  Per her report, husband has started physical therapy and  Occupational Therapy.  However, he still does not practice the exercises as instructed between sessions.  She also expresses frustration husband is constantly asking her to do things that he can do for himself per her report.  Patient reports continued irritability, anger, and emotional outbursts.  She is pleased she has made more efforts to attend the gym consistently but expresses frustration she is gaining weight.  She admits eating more than she normally does as she states she stays at home most of the time except for going to work and the gym.  Patient reports trying to express her concerns to her husband.    Suicidal/Homicidal:  No, without intent or plan  Therapist Response:  reviewed symptoms, discussed stressors, facilitated expression of thoughts and feelings, validated feelings, praised and reinforced patient's efforts to try to maintain consistent attendance at the gym, assisted patient examine pattern of recent interaction with husband, assisted patient identify ways to disrupt unhealthy patterns including delaying her response, assisted patient examine thoughts/emotions/and behaviors regarding a recent emotional outburst where she was verbally aggressive, assisted patient identify the effects of aggressive behavior on her functioning, assisted patient identify alternative ways of responding, discussed the role of feelings, developed plan with patient to use journal to express her feelings, discussed patient resuming use of previous coping tools congruent with her values that have helped her in previous adversities (listening to inspirational music, reading scriptures, praying)  plan: Return again in 2 weeks.  Diagnosis: Axis I: Bipolar Disorder    Axis II: Borderline Personality Dis.  Collaboration of Care: Psychiatrist AEB patient seeing psychiatrist Dr. Lolly Mustache, clinician reviewing chart.    Patient/Guardian was advised Release of Information must be  obtained prior to any record release  in order to collaborate their care with an outside provider. Patient/Guardian was advised if they have not already done so to contact the registration department to sign all necessary forms in order for [name] to release information regarding their care.   Consent: Patient/Guardian gives verbal consent for treatment and assignment of benefits for services provided during this visit. Patient/Guardian expressed understanding and agreed to proceed.   [name], LCSW 05/29/2022

## 2022-06-09 DIAGNOSIS — N3020 Other chronic cystitis without hematuria: Secondary | ICD-10-CM | POA: Diagnosis not present

## 2022-06-13 ENCOUNTER — Ambulatory Visit (INDEPENDENT_AMBULATORY_CARE_PROVIDER_SITE_OTHER): Payer: 59 | Admitting: Psychiatry

## 2022-06-13 DIAGNOSIS — F319 Bipolar disorder, unspecified: Secondary | ICD-10-CM

## 2022-06-13 NOTE — Progress Notes (Signed)
Virtual Visit via Video Note  I connected with Susan Ball on 06/13/22 at 9:10 AM EDT  by a video enabled telemedicine application and verified that I am speaking with the correct person using two identifiers.  Location: Patient: Home Provider: [facility] [place] office    I discussed the limitations of evaluation and management by telemedicine and the availability of in person appointments. The patient expressed understanding and agreed to proceed.   I provided 48 minutes of non-face-to-face time during this encounter.   Susan Salvage, LCSW    THERAPIST PROGRESS NOTE       Session Time: Tuesday  06/13/2022 9:10 AM - 9:58 AM   Participation Level: Active  Behavioral Response: CasualAlert/  Type of Therapy: Individual Therapy  Treatment Goals addressed: Susan Ball will reduce the amount of anger related incident/outburst by 50% for 4 consecutive weeks/Susan Ball will identify situations, thoughts, and feelings that trigger internal anger, angry verbal and or aggressive behavioral actions as evidenced by self recorded report  Progress on Goals: not Progressing   Interventions: CBT and Supportive  Summary: Susan Ball is a 52 y.o. female who  is referred for services by Susan Ball. She just completed IOP on March 27, 2019. She has long standing history of bipolar disorder and borderline personality disorder. She has had 3 psychiatric hospitalizations. The last was in 2010 at [facility] due to suicide attempt. She participated in outpatient therapy intermittently at [facility] for 3 years. She last was seen there 6 months ago.  Current symptoms include depressed mood, crying spells, mood swings, aggression, anger, excessive worry, sleep difficulty, poor concentration, and memory difficulty.  Patient was seen via virtual visit about 2 weeks ago. She states being manic for about the past 2-1/2 weeks.  She reports unrestrained buying sprees, reckless behaviors such as driving fast,  increased irritability, impulsivity, and inflated self-esteem.  She also reports continued road rage.  Per patient's report, she has been forgetting to take her medication about 2 to 3 days/week.  She also reports increased marijuana use and says she smokes a blunt every evening.    Suicidal/Homicidal:  No, without intent or plan  Therapist Response:  reviewed symptoms, assisted patient identify possible triggers of increased manic behavior, discussed the role of medication in managing bipolar disorder, developed plan with patient to resume taking medication as prescribed, encouraged patient to share symptoms and discuss medication concerns with psychiatrist Dr. Lolly Mustache at med management appointment on 06/16/2022, developed plan with patient to enlist help from mother to avoid excessive spending by asking mother to keep her debit card/money for a period of time and pt only keep a minimal amount of money for emergencies, developed plan with patient to avoid going to the [facility] for her workouts at this time to avoid confrontations but exercise at a local trail, block numbers on her phone from people whom she wants to avoid during this time, delay answering the phone when receiving calls from people she wants to avoid during this time, discussed concerns regarding possible interaction between marijuana and her medication, urged patient to discuss with psychiatrist Dr. Lolly Mustache, reviewed and encouraged patient to resume use of relaxation techniques such as deep breathing .  plan: Return again in 2 weeks.  Diagnosis: Axis I: Bipolar Disorder    Axis II: Borderline Personality Dis.  Collaboration of Care: Psychiatrist AEB patient seeing psychiatrist Dr. Lolly Mustache, clinician reviewing chart.    Patient/Guardian was advised Release of Information must be obtained prior to any record release  in order to collaborate their care with an outside provider. Patient/Guardian was advised if they have not already done so to  contact the registration department to sign all necessary forms in order for [name] to release information regarding their care.   Consent: Patient/Guardian gives verbal consent for treatment and assignment of benefits for services provided during this visit. Patient/Guardian expressed understanding and agreed to proceed.   Susan Salvage, LCSW 06/13/2022

## 2022-06-16 ENCOUNTER — Telehealth (HOSPITAL_BASED_OUTPATIENT_CLINIC_OR_DEPARTMENT_OTHER): Payer: 59 | Admitting: Psychiatry

## 2022-06-16 ENCOUNTER — Encounter (HOSPITAL_COMMUNITY): Payer: Self-pay | Admitting: Psychiatry

## 2022-06-16 VITALS — Wt 260.0 lb

## 2022-06-16 DIAGNOSIS — F6089 Other specific personality disorders: Secondary | ICD-10-CM | POA: Diagnosis not present

## 2022-06-16 DIAGNOSIS — F4310 Post-traumatic stress disorder, unspecified: Secondary | ICD-10-CM

## 2022-06-16 DIAGNOSIS — F319 Bipolar disorder, unspecified: Secondary | ICD-10-CM | POA: Diagnosis not present

## 2022-06-16 DIAGNOSIS — F419 Anxiety disorder, unspecified: Secondary | ICD-10-CM | POA: Diagnosis not present

## 2022-06-16 MED ORDER — FLUOXETINE HCL 20 MG PO CAPS: 20.0000 mg | ORAL_CAPSULE | Freq: Every day | ORAL | 0 refills | Status: DC

## 2022-06-16 MED ORDER — LITHIUM CARBONATE ER 450 MG PO TBCR: EXTENDED_RELEASE_TABLET | ORAL | 2 refills | Status: DC

## 2022-06-16 MED ORDER — VORTIOXETINE HBR 20 MG PO TABS: 20.0000 mg | ORAL_TABLET | Freq: Every day | ORAL | 0 refills | Status: DC

## 2022-06-16 MED ORDER — CLONIDINE HCL 0.2 MG PO TABS: 0.2000 mg | ORAL_TABLET | Freq: Every day | ORAL | 0 refills | Status: DC

## 2022-06-16 NOTE — Progress Notes (Signed)
[name] MD Virtual Progress Note   Patient Location: In car Provider Location: Home Office  I connect with patient by video and verified that I am speaking with correct person by using two identifiers. I discussed the limitations of evaluation and management by telemedicine and the availability of in person appointments. I also discussed with the patient that there may be a patient responsible charge related to this service. The patient expressed understanding and agreed to proceed.  Susan Ball [id number] 52 y.o.  06/16/2022 10:45 AM  History of Present Illness:  Patient is evaluated by video session.  She started therapy with Susan Ball.  She reported husband is little bit better and has started walking with the help of cane.  However she is the primary caretaker of the husband and taking him to the doctor's appointment and physical therapy.  Patient told her son is available and help her if she needed.  She reported therapy with Susan Ball is helping and she is not as irritable, angry or having crying spells.  She still have mood swing but she understands it is situational and hoping to get better.  She is sleeping okay.  She denies any mania, psychosis, hallucination.  She denies any self-abusive behavior or any suicidal thoughts.  Patient reported son is in the process of joining the [employer] but not sure about the surgery.  She has few panic attacks but also admitted not taking the medication on a regular basis.  Now she has a reminder that reminds to take the medication and that has been helpful.  Her appetite is okay.  She gained 2 pounds from the past as she is not exercising or watching her calorie intake.  Now she had a membership at the [facility] and she is going to start gym on a regular basis.  She denies any nightmares, flashback.  She denies any tremors or shakes.  Recently she had a visit to the emergency room for UTI.  She is feeling better now.  Past Psychiatric  History: H/O Bipolar, anxiety and borderline traits. On meds since age 30.  H/O inpatient, suicidal attempt, overdose, jump from the car and banging head.  Tried Lamictal (rash), Seroquel, Abilify, doxepin, Trazodone, Vraylar, olanzapine and hydroxyzine. H/O n/c with meds, refusing to eat and needed PICC line. H/O anger, road rage, paranoia and trust issues. Had DBT with good response. Genesight test shows Pristiq, Fetzima and Viibryd, lithium, tegretol and Valproic in better column.     Outpatient Encounter Medications as of 06/16/2022  Medication Sig   albuterol (VENTOLIN HFA) 108 (90 Base) MCG/ACT inhaler SMARTSIG:2 Puff(s) By Mouth 4 Times Daily PRN   calcium carbonate (OS-CAL - DOSED IN MG OF ELEMENTAL CALCIUM) 1250 (500 Ca) MG tablet Take 1 tablet by mouth daily with breakfast.    cephALEXin (KEFLEX) 500 MG capsule Take 1 capsule (500 mg total) by mouth 2 (two) times daily.   Cholecalciferol 25 MCG (1000 UT) capsule Take 1,000 Units by mouth daily.    cloNIDine (CATAPRES) 0.2 MG tablet Take 1 tablet (0.2 mg total) by mouth at bedtime.   Ferrous Sulfate (SLOW FE) 142 (45 Fe) MG TBCR 1 tablet   FLUoxetine (PROZAC) 20 MG capsule Take 1 capsule (20 mg total) by mouth daily.   fluticasone (FLONASE) 50 MCG/ACT nasal spray 2 sprays   lithium carbonate (ESKALITH) 450 MG ER tablet Take one tab (300 mg total) twice a day.   solifenacin (VESICARE) 5 MG tablet Take 5 mg by  mouth daily.   Trimethoprim HCl (TRIMPEX PO) Take by mouth.   vortioxetine HBr (TRINTELLIX) 20 MG TABS tablet Take 1 tablet (20 mg total) by mouth daily.   No facility-administered encounter medications on file as of 06/16/2022.    Recent Results (from the past 2160 hour(s))  POCT urinalysis dipstick     Status: Abnormal   Collection Time: 05/23/22  3:52 PM  Result Value Ref Range   Color, UA yellow yellow   Clarity, UA clear clear   Glucose, UA negative negative mg/dL   Bilirubin, UA negative negative   Ketones, POC UA  negative negative mg/dL   Spec Grav, UA 5.621 3.086 - 1.025   Blood, UA moderate (A) negative   pH, UA 7.0 5.0 - 8.0   Protein Ur, POC negative negative mg/dL   Urobilinogen, UA 0.2 0.2 or 1.0 E.U./dL   Nitrite, UA Negative Negative   Leukocytes, UA Negative Negative  Urine Culture     Status: None   Collection Time: 05/23/22  3:57 PM   Specimen: Urine, Clean Catch  Result Value Ref Range   Specimen Description      URINE, CLEAN CATCH Performed at [facility], [address] [place], [place] [address]    Special Requests      NONE Performed at [facility], [address] [place] [address]    Culture      NO GROWTH Performed at [facility], [address], [place], [place] [address]    Report Status 05/24/2022 FINAL   POCT CBG (manual entry)     Status: None   Collection Time: 05/23/22  3:59 PM  Result Value Ref Range   POCT Glucose (KUC) 83 70 - 99 mg/dL     Psychiatric Specialty Exam: Physical Exam  Review of Systems  Constitutional:  Positive for appetite change.  Psychiatric/Behavioral:  Positive for hallucinations and sleep disturbance.     Weight 260 lb (117.9 kg).There is no height or weight on file to calculate BMI.  General Appearance: Casual  Susan Contact:  Fair  Speech:  Slow  Volume:  Decreased  Mood:  Anxious and Dysphoric  Affect:  Congruent  Thought Process:  Goal Directed  Orientation:  Full (Time, Place, and Person)  Thought Content:  Rumination  Suicidal Thoughts:  No  Homicidal Thoughts:  No  Memory:  Immediate;   Good Recent;   Fair Remote;   Fair  Judgement:  Fair  Insight:  Fair  Psychomotor Activity:  Decreased  Concentration:  Concentration: Fair and Attention Span: Fair  Recall:  Fair  Fund of Knowledge:  Good  Language:  Good  Akathisia:  No  Handed:  Right  AIMS (if indicated):     Assets:  Communication Skills Desire for Improvement Housing Transportation  ADL's:  Intact  Cognition:  WNL  Sleep:  ok      Assessment/Plan: Bipolar I disorder (HCC) - Plan: FLUoxetine (PROZAC) 20 MG capsule, cloNIDine (CATAPRES) 0.2 MG tablet, vortioxetine HBr (TRINTELLIX) 20 MG TABS tablet, lithium carbonate (ESKALITH) 450 MG ER tablet  Anxiety - Plan: FLUoxetine (PROZAC) 20 MG capsule, cloNIDine (CATAPRES) 0.2 MG tablet, vortioxetine HBr (TRINTELLIX) 20 MG TABS tablet  PTSD (post-traumatic stress disorder) - Plan: FLUoxetine (PROZAC) 20 MG capsule  Cluster B personality disorder (HCC) - Plan: cloNIDine (CATAPRES) 0.2 MG tablet, lithium carbonate (ESKALITH) 450 MG ER tablet  Review lab results, psychosocial stressors, collateral from other providers and review of chart.  She has been doing better  as husband is also making progress and able to walk with the help of cane.  Discussed to continue therapy, encouraged to take the medicine on time as patient has not been taking consistently.  Patient has a history of noncompliance with medication resulting in decompensation.  Provided psychoeducation.  Patient does not want to change the medication because she feels it is working however just need to make a habit to take the medicine on time.  She is hoping started gym will help and focus on her own health.  We will continue lithium 450 mg twice a day, Prozac 20 mg daily, Trintellix 20 mg daily and clonidine 0.2 mg at bedtime.  Recommended to call us back if he has any question or any concern.  Follow-up in 3 months.   Follow Up Instructions:     I discussed the assessment and treatment plan with the patient. The patient was provided an opportunity to ask questions and all were answered. The patient agreed with the plan and demonstrated an understanding of the instructions.   The patient was advised to call back or seek an in-person evaluation if the symptoms worsen or if the condition fails to improve as anticipated.    Collaboration of Care: Other provider involved in patient's care AEB notes are available in epic  to review.  Patient/Guardian was advised Release of Information must be obtained prior to any record release in order to collaborate their care with an outside provider. Patient/Guardian was advised if they have not already done so to contact the registration department to sign all necessary forms in order for [name] to release information regarding their care.   Consent: Patient/Guardian gives verbal consent for treatment and assignment of benefits for services provided during this visit. Patient/Guardian expressed understanding and agreed to proceed.     I provided 25 minutes of non face to face time during this encounter.  Note: This document was prepared by [employer] voice dictation technology and any errors that results from this process are unintentional.    Cleotis Nipper, MD 06/16/2022

## 2022-07-13 DIAGNOSIS — N3281 Overactive bladder: Secondary | ICD-10-CM | POA: Diagnosis not present

## 2022-07-13 DIAGNOSIS — Z1329 Encounter for screening for other suspected endocrine disorder: Secondary | ICD-10-CM | POA: Diagnosis not present

## 2022-07-13 DIAGNOSIS — R42 Dizziness and giddiness: Secondary | ICD-10-CM | POA: Diagnosis not present

## 2022-07-13 DIAGNOSIS — J0100 Acute maxillary sinusitis, unspecified: Secondary | ICD-10-CM | POA: Diagnosis not present

## 2022-07-19 ENCOUNTER — Ambulatory Visit (INDEPENDENT_AMBULATORY_CARE_PROVIDER_SITE_OTHER): Payer: 59 | Admitting: Psychiatry

## 2022-07-19 DIAGNOSIS — F319 Bipolar disorder, unspecified: Secondary | ICD-10-CM

## 2022-07-19 DIAGNOSIS — F4310 Post-traumatic stress disorder, unspecified: Secondary | ICD-10-CM

## 2022-07-19 NOTE — Progress Notes (Addendum)
Virtual Visit via Video Note  I connected with [name] on 07/19/22 at 9:08 AM EDT  by a video enabled telemedicine application and verified that I am speaking with the correct person using two identifiers.  Location: Patient: Home Provider: [facility] [place] office    I discussed the limitations of evaluation and management by telemedicine and the availability of in person appointments. The patient expressed understanding and agreed to proceed.  I provided 45 minutes of non-face-to-face time during this encounter.   [name], LCSW    THERAPIST PROGRESS NOTE       Session Time: Wednesday   07/19/2022 9:08 AM - 9:53 AM   Participation Level: Active  Behavioral Response: CasualAlert/  Type of Therapy: Individual Therapy  Treatment Goals addressed: [name] will reduce the amount of anger related incident/outburst by 50% for 4 consecutive weeks/Joelee will identify situations, thoughts, and feelings that trigger internal anger, angry verbal and or aggressive behavioral actions as evidenced by self recorded report  Progress on Goals: not Progressing   Interventions: CBT and Supportive  Summary: MAME TWOMBLY is a 52 y.o. female who  is referred for services by [name]. She just completed IOP on March 27, 2019. She has long standing history of bipolar disorder and borderline personality disorder. She has had 3 psychiatric hospitalizations. The last was in 2010 at [facility] due to suicide attempt. She participated in outpatient therapy intermittently at [facility] for 3 years. She last was seen there 6 months ago.  Current symptoms include depressed mood, crying spells, mood swings, aggression, anger, excessive worry, sleep difficulty, poor concentration, and memory difficulty.  Patient was seen via virtual visit about 4 weeks ago. She reports continued symptoms of manic mood since last session. She reports continued unrestrained buying sprees, reckless behaviors  such as driving fast, increased irritability, impulsivity, anger outbursts, and inflated self-esteem.  She also reports decreased need for sleep reporting sleeping only about 3-4 hours per night for the past month.  She expresses long standing anger and frustration regarding her ex-husband getting her home in their divorce settlement. She reports having thoughts of burning down his house when he is not there but states she will not do this. She reports having more frequent suicidal thoughts but denies any intent. She also reports carrying her gun with her since April but denies any plan to use it . She has gun on the front seat of car and shows it to clinician during video visit. Pt reports continued marijuana use.   Per pt's report, she attended medication management appointment with Dr. Lolly Mustache and has been taking medication consistently since appointment. However, she reports she was not forthright with Dr. Lolly Mustache regarding experiencing manic symptoms. Pt reports she was not able to implement any of the strategies discussed in last session to help manage manic episode.   Suicidal/Homicidal:  No, without intent or plan.  Patient agrees to call 911, [phone], I have someone take her to the ED should symptoms worsen.  Therapist Response:  reviewed symptoms, praised and reinforced patient's efforts and following through with medication management appointment and improving medication compliance, reviewed  the role of medication in managing bipolar disorder and the importance of being forthright with Dr. Lolly Mustache regarding her symptoms, again discussed concerns regarding possible interaction between marijuana and her medication, recommended patient schedule an earlier appointment with Dr. Lolly Mustache for medication management and urged patient to be forthright regarding her behaviors/symptoms, discussed concerns about having gun easily accessible especially with  patient being more impulsive, explored ways to have gun less  accessible, patient states she will place gun in her trunk, patient agrees to attend an earlier appointment with psychiatrist Dr. Lolly Mustache but does not trust self to initiate call to schedule the appointment, therapist will make referral to Dr. Lolly Mustache for earlier medication management appointment, patient agrees to call 911, [phone], or have someone take her to the ED should symptoms worsen.  plan: Return again in 2 weeks.  Diagnosis: Axis I: Bipolar Disorder    Axis II: Borderline Personality Dis.  Collaboration of Care: Psychiatrist AEB patient seeing psychiatrist Dr. Lolly Mustache, clinician reviewing chart, therapist will make referral to psychiatrist Dr. Lolly Mustache for an earlier appointment  Patient/Guardian was advised Release of Information must be obtained prior to any record release in order to collaborate their care with an outside provider. Patient/Guardian was advised if they have not already done so to contact the registration department to sign all necessary forms in order for [name] to release information regarding their care.   Consent: Patient/Guardian gives verbal consent for treatment and assignment of benefits for services provided during this visit. Patient/Guardian expressed understanding and agreed to proceed.   [name], LCSW 07/19/2022

## 2022-07-21 DIAGNOSIS — J320 Chronic maxillary sinusitis: Secondary | ICD-10-CM | POA: Diagnosis not present

## 2022-08-02 ENCOUNTER — Ambulatory Visit (INDEPENDENT_AMBULATORY_CARE_PROVIDER_SITE_OTHER): Payer: 59 | Admitting: Psychiatry

## 2022-08-02 DIAGNOSIS — F319 Bipolar disorder, unspecified: Secondary | ICD-10-CM

## 2022-08-02 DIAGNOSIS — F4310 Post-traumatic stress disorder, unspecified: Secondary | ICD-10-CM | POA: Diagnosis not present

## 2022-08-02 NOTE — Progress Notes (Unsigned)
Virtual Visit via Video Note  I connected with Susan Ball on 08/02/22 at 9:05 AM EDT  by a video enabled telemedicine application and verified that I am speaking with the correct person using two identifiers.  Location: Patient: Home Provider: [facility] Outpatient [place] office    I discussed the limitations of evaluation and management by telemedicine and the availability of in person appointments. The patient expressed understanding and agreed to proceed.   I provided 50 minutes of non-face-to-face time during this encounter.   [name], LCSW    THERAPIST PROGRESS NOTE       Session Time: Wednesday   08/02/2022 9:05 AM -  9:55 AM   Participation Level: Active  Behavioral Response: CasualAlert/  Type of Therapy: Individual Therapy  Treatment Goals addressed: Susan Ball will reduce the amount of anger related incident/outburst by 50% for 4 consecutive weeks/Susan Ball will identify situations, thoughts, and feelings that trigger internal anger, angry verbal and or aggressive behavioral actions as evidenced by self recorded report  Progress on Goals: not Progressing   Interventions: CBT and Supportive  Summary: Susan Ball is a 52 y.o. female who  is referred for services by [name]. She just completed IOP on March 27, 2019. She has long standing history of bipolar disorder and borderline personality disorder. She has had 3 psychiatric hospitalizations. The last was in 2010 at [facility] due to suicide attempt. She participated in outpatient therapy intermittently at [facility] for 3 years. She last was seen there 6 months ago.  Current symptoms include depressed mood, crying spells, mood swings, aggression, anger, excessive worry, sleep difficulty, poor concentration, and memory difficulty.  Patient was seen via virtual visit about 2 weeks ago. She reports decreased intensity and frequency of manic behaviors since last session.  However, she still reports unrestrained buying  sprees, impulsivity, and irritability.  She reports increased frustration and stress regarding relationship with her husband as he is not doing things he is capable of doing for self per patient's report.  He is attending physical and Occupational Therapy twice per week, participating in session, but not doing exercises at home.  He also is constantly asking patient to do things for him per her report.  Patient has talked with his doctor who suspects he has depression and has prescribed medication.  Patient reports feeling overwhelmed and tired.  She reports feeling less manic but starting to have down days.  She denies any suicidal ideations.  She continues to attend the gym, go to work, and attend church. Patient reports she no longer has gun on the front seat of her car but is keeping it in her trunk.  Patient reports she is receiving more support from her mother and sister.  Patient reports she is medication compliant.  She also continues to use marijuana.   Suicidal/Homicidal:  No, without intent or plan.  Patient agrees to call 911, [phone], I have someone take her to the ED should symptoms worsen.  Therapist Response:  reviewed symptoms, praised and reinforced patient's efforts and medication compliance, encouraged patient to follow through with scheduled appointment with psychiatrist Dr. Lolly Mustache, discussed stressors, facilitated expression of thoughts and feelings, validated feelings, assisted patient identify realistic expectations of husband, and assisted patient identify her pattern of interaction with husband, assisted patient identify ways to set and maintain limits with husband while also supporting him as part of her values, assisted patient identify realistic expectations of self as ways to schedule time for self, reviewed relaxation techniques and  encouraged patient to practice, encouraged patient to use her support system  plan: Return again in 2 weeks.  Diagnosis: Axis I: Bipolar  Disorder    Axis II: Borderline Personality Dis.  Collaboration of Care: Psychiatrist AEB patient seeing psychiatrist Dr. Lolly Mustache, clinician reviewing chart  Patient/Guardian was advised Release of Information must be obtained prior to any record release in order to collaborate their care with an outside provider. Patient/Guardian was advised if they have not already done so to contact the registration department to sign all necessary forms in order for [name] to release information regarding their care.   Consent: Patient/Guardian gives verbal consent for treatment and assignment of benefits for services provided during this visit. Patient/Guardian expressed understanding and agreed to proceed.   [name], LCSW 08/02/2022

## 2022-08-21 ENCOUNTER — Ambulatory Visit (HOSPITAL_COMMUNITY): Payer: 59 | Admitting: Psychiatry

## 2022-08-25 ENCOUNTER — Encounter (HOSPITAL_COMMUNITY): Payer: Self-pay | Admitting: Psychiatry

## 2022-08-25 ENCOUNTER — Telehealth (HOSPITAL_BASED_OUTPATIENT_CLINIC_OR_DEPARTMENT_OTHER): Payer: 59 | Admitting: Psychiatry

## 2022-08-25 VITALS — Wt 260.0 lb

## 2022-08-25 DIAGNOSIS — F319 Bipolar disorder, unspecified: Secondary | ICD-10-CM | POA: Diagnosis not present

## 2022-08-25 DIAGNOSIS — F419 Anxiety disorder, unspecified: Secondary | ICD-10-CM

## 2022-08-25 DIAGNOSIS — F6089 Other specific personality disorders: Secondary | ICD-10-CM

## 2022-08-25 DIAGNOSIS — F4310 Post-traumatic stress disorder, unspecified: Secondary | ICD-10-CM

## 2022-08-25 MED ORDER — CLONIDINE HCL 0.2 MG PO TABS: 0.2000 mg | ORAL_TABLET | Freq: Every day | ORAL | 0 refills | Status: DC

## 2022-08-25 MED ORDER — FLUOXETINE HCL 20 MG PO CAPS: 20.0000 mg | ORAL_CAPSULE | Freq: Every day | ORAL | 0 refills | Status: DC

## 2022-08-25 MED ORDER — VORTIOXETINE HBR 20 MG PO TABS: 20.0000 mg | ORAL_TABLET | Freq: Every day | ORAL | 0 refills | Status: DC

## 2022-08-25 MED ORDER — LITHIUM CARBONATE ER 450 MG PO TBCR: EXTENDED_RELEASE_TABLET | ORAL | 2 refills | Status: DC

## 2022-08-25 NOTE — Progress Notes (Signed)
[facility] MD Virtual Progress Note   Patient Location: Home Provider Location: Home Office  I connect with patient by telephone and verified that I am speaking with correct person by using two identifiers. I discussed the limitations of evaluation and management by telemedicine and the availability of in person appointments. I also discussed with the patient that there may be a patient responsible charge related to this service. The patient expressed understanding and agreed to proceed.  Susan Ball [id number] 52 y.o.  08/25/2022 11:41 AM  History of Present Illness:  Patient is evaluated by phone session.  Her therapist requested 3 weeks earlier appointment because she had a manic episode which she described excessive driving, impulsive buying but after a few days she is back to normal.  She is not sure what triggered but realized she may have not taken the medicine for a few days.  Since she is back to the medicine she is doing fine.  Her sleep is good.  She is not doing any impulsive behavior.  She is taking care of her husband who has chronic health issues.  She denies any panic attack.  She has no tremor or shakes or any EPS.  She is in therapy with Noel Journey.  She like to keep the current medicine since it is working but emphasis was given to take the medicine every day as prescribed.  She denies any hallucination, paranoia, suicidal thoughts.  Past Psychiatric History: H/O Bipolar, anxiety and borderline traits. On meds since age 82.  H/O inpatient, suicidal attempt, overdose, jump from the car and banging head.  Tried Lamictal (rash), Seroquel, Abilify, doxepin, Trazodone, Vraylar, olanzapine and hydroxyzine. H/O n/c with meds, refusing to eat and needed PICC line. H/O anger, road rage, paranoia and trust issues. Had DBT with good response. Genesight test shows Pristiq, Fetzima and Viibryd, lithium, tegretol and Valproic in better column.     Outpatient  Encounter Medications as of 08/25/2022  Medication Sig   albuterol (VENTOLIN HFA) 108 (90 Base) MCG/ACT inhaler SMARTSIG:2 Puff(s) By Mouth 4 Times Daily PRN   calcium carbonate (OS-CAL - DOSED IN MG OF ELEMENTAL CALCIUM) 1250 (500 Ca) MG tablet Take 1 tablet by mouth daily with breakfast.    cephALEXin (KEFLEX) 500 MG capsule Take 1 capsule (500 mg total) by mouth 2 (two) times daily.   Cholecalciferol 25 MCG (1000 UT) capsule Take 1,000 Units by mouth daily.    cloNIDine (CATAPRES) 0.2 MG tablet Take 1 tablet (0.2 mg total) by mouth at bedtime.   Ferrous Sulfate (SLOW FE) 142 (45 Fe) MG TBCR 1 tablet   FLUoxetine (PROZAC) 20 MG capsule Take 1 capsule (20 mg total) by mouth daily.   fluticasone (FLONASE) 50 MCG/ACT nasal spray 2 sprays   lithium carbonate (ESKALITH) 450 MG ER tablet Take one tab (300 mg total) twice a day.   solifenacin (VESICARE) 5 MG tablet Take 5 mg by mouth daily.   Trimethoprim HCl (TRIMPEX PO) Take by mouth.   vortioxetine HBr (TRINTELLIX) 20 MG TABS tablet Take 1 tablet (20 mg total) by mouth daily.   No facility-administered encounter medications on file as of 08/25/2022.    No results found for this or any previous visit (from the past 2160 hour(s)).   Psychiatric Specialty Exam: Physical Exam  Review of Systems  Weight 260 lb (117.9 kg).There is no height or weight on file to calculate BMI.  General Appearance: NA  Eye Contact:  NA  Speech:  Slow  Volume:  Decreased  Mood:  Dysphoric  Affect:  NA  Thought Process:  Descriptions of Associations: Intact  Orientation:  Full (Time, Place, and Person)  Thought Content:  Rumination  Suicidal Thoughts:  No  Homicidal Thoughts:  No  Memory:  Immediate;   Good Recent;   Fair Remote;   Good  Judgement:  Fair  Insight:  Shallow  Psychomotor Activity:  NA  Concentration:  Concentration: Fair and Attention Span: Fair  Recall:  Good  Fund of Knowledge:  Good  Language:  Good  Akathisia:  No  Handed:  Right   AIMS (if indicated):     Assets:  Communication Skills Desire for Improvement Housing Social Support  ADL's:  Intact  Cognition:  WNL  Sleep:  was not doing well but now better     Assessment/Plan: Bipolar I disorder (HCC) - Plan: vortioxetine HBr (TRINTELLIX) 20 MG TABS tablet, lithium carbonate (ESKALITH) 450 MG ER tablet, FLUoxetine (PROZAC) 20 MG capsule, cloNIDine (CATAPRES) 0.2 MG tablet  Anxiety - Plan: vortioxetine HBr (TRINTELLIX) 20 MG TABS tablet, FLUoxetine (PROZAC) 20 MG capsule, cloNIDine (CATAPRES) 0.2 MG tablet  Cluster B personality disorder (HCC) - Plan: lithium carbonate (ESKALITH) 450 MG ER tablet, cloNIDine (CATAPRES) 0.2 MG tablet  PTSD (post-traumatic stress disorder) - Plan: FLUoxetine (PROZAC) 20 MG capsule  Patient had manic episode a few weeks ago but she recall not taking the medicine.  However things are much better since back on medicine.  She has no major concerns or issues.  I encouraged to keep therapy with Duane Boston.  She has a history of noncompliance with medication but realized that she has to take the medicine otherwise she will get sick.  No change in the medication.  Continue Prozac 20 mg daily, Trintellix 20 mg daily, clonidine 0.2 mg at bedtime and lithium 450 mg twice a day.  We will move her appointment in 3 months.  She can call us back if she has any question or any concern.   Follow Up Instructions:     I discussed the assessment and treatment plan with the patient. The patient was provided an opportunity to ask questions and all were answered. The patient agreed with the plan and demonstrated an understanding of the instructions.   The patient was advised to call back or seek an in-person evaluation if the symptoms worsen or if the condition fails to improve as anticipated.    Collaboration of Care: Other provider involved in patient's care AEB notes are available in epic to review.  Patient/Guardian was advised Release of  Information must be obtained prior to any record release in order to collaborate their care with an outside provider. Patient/Guardian was advised if they have not already done so to contact the registration department to sign all necessary forms in order for [name] to release information regarding their care.   Consent: Patient/Guardian gives verbal consent for treatment and assignment of benefits for services provided during this visit. Patient/Guardian expressed understanding and agreed to proceed.     I provided 10 minutes of non face to face time during this encounter.  Note: This document was prepared by [employer] voice dictation technology and any errors that results from this process are unintentional.    Cleotis Nipper, MD 08/25/2022

## 2022-08-29 DIAGNOSIS — J320 Chronic maxillary sinusitis: Secondary | ICD-10-CM | POA: Diagnosis not present

## 2022-08-30 ENCOUNTER — Ambulatory Visit (HOSPITAL_COMMUNITY): Payer: 59 | Admitting: Psychiatry

## 2022-09-12 ENCOUNTER — Ambulatory Visit (HOSPITAL_COMMUNITY): Payer: 59 | Admitting: Psychiatry

## 2022-09-12 DIAGNOSIS — F603 Borderline personality disorder: Secondary | ICD-10-CM

## 2022-09-12 DIAGNOSIS — F319 Bipolar disorder, unspecified: Secondary | ICD-10-CM

## 2022-09-12 NOTE — Progress Notes (Signed)
Virtual Visit via Video Note  I connected with Susan Ball on 09/12/22 at 9:06 AM Edt  by a video enabled telemedicine application and verified that I am speaking with the correct person using two identifiers.  Location: Patient: Home Provider: [facility] Outpatient [place] office    I discussed the limitations of evaluation and management by telemedicine and the availability of in person appointments. The patient expressed understanding and agreed to proceed.   I provided 49 minutes of non-face-to-face time during this encounter.   Susan Salvage, LCSW   THERAPIST PROGRESS NOTE       Session Time: Tuesday  09/12/2022 9:06 AM -  9:55 AM   Participation Level: Active  Behavioral Response: CasualAlert/  Type of Therapy: Individual Therapy  Treatment Goals addressed: Susan Ball will reduce the amount of anger related incident/outburst by 50% for 4 consecutive weeks/Susan Ball will identify situations, thoughts, and feelings that trigger internal anger, angry verbal and or aggressive behavioral actions as evidenced by self recorded report  Progress on Goals: not Progressing   Interventions: CBT and Supportive  Summary: Susan Ball is a 52 y.o. female who  is referred for services by [name]. She just completed IOP on March 27, 2019. She has long standing history of bipolar disorder and borderline personality disorder. She has had 3 psychiatric hospitalizations. The last was in 2010 at [facility] due to suicide attempt. She participated in outpatient therapy intermittently at [facility] for 3 years. She last was seen there 6 months ago.  Current symptoms include depressed mood, crying spells, mood swings, aggression, anger, excessive worry, sleep difficulty, poor concentration, and memory difficulty.  Patient was seen via virtual visit about 4 weeks ago. She states having rapid mood changes within the course of the day she denies any SI and HI.  She reports excessive shopping and  participating in other impulsive behaviors she declines to disclose. She has followed up with psychiatrist and reports taking medication regularly.  She also continues marijuana use smoking a blunt 3 out of 7 days per her report.  She still is going to the gym and going to work.  She reports continued stress regarding her marriage as husband still is not performing activities for self he is capable of doing per patient's report.  Per her report, he was terminated from physical therapy due to treatment noncompliance as he would not practice his exercises at home.  She continues to feel overwhelmed and expresses resentment regarding husband's behavior.  She continues to struggle with using assertiveness skills and setting/maintaining limits as she feels guilty and expresses concern about others' opinions.   Suicidal/Homicidal:  No, without intent or plan.  Patient agrees to call 911, [phone], I have someone take her to the ED should symptoms worsen.  Therapist Response:  reviewed symptoms, praised and reinforced patient's efforts and medication compliance, discussed stressors, facilitated expression of thoughts and feelings, validated feelings, assisted patient identify basic personal rights to promote effective assertion, reviewed ways to set and maintain limits with husband while also supporting him as part of her values, discussed rationale for and develop plan with patient to complete symptom monitoring forms and bring completed forms to next session.   plan: Return again in 2 weeks.  Diagnosis: Axis I: Bipolar Disorder    Axis II: Borderline Personality Dis.  Collaboration of Care: Psychiatrist AEB patient seeing psychiatrist Dr. Lolly Ball, clinician reviewing chart  Patient/Guardian was advised Release of Information must be obtained prior to any record release in order to  collaborate their care with an outside provider. Patient/Guardian was advised if they have not already done so to contact the  registration department to sign all necessary forms in order for [name] to release information regarding their care.   Consent: Patient/Guardian gives verbal consent for treatment and assignment of benefits for services provided during this visit. Patient/Guardian expressed understanding and agreed to proceed.   Susan Salvage, LCSW 09/12/2022

## 2022-09-18 ENCOUNTER — Telehealth (HOSPITAL_COMMUNITY): Payer: 59 | Admitting: Psychiatry

## 2022-09-18 DIAGNOSIS — Z1231 Encounter for screening mammogram for malignant neoplasm of breast: Secondary | ICD-10-CM | POA: Diagnosis not present

## 2022-09-26 ENCOUNTER — Ambulatory Visit (INDEPENDENT_AMBULATORY_CARE_PROVIDER_SITE_OTHER): Payer: 59 | Admitting: Psychiatry

## 2022-09-26 DIAGNOSIS — F603 Borderline personality disorder: Secondary | ICD-10-CM | POA: Diagnosis not present

## 2022-09-26 DIAGNOSIS — F319 Bipolar disorder, unspecified: Secondary | ICD-10-CM | POA: Diagnosis not present

## 2022-09-26 NOTE — Progress Notes (Signed)
Virtual Visit via Video Note  I connected with Susan Ball on 09/26/22 at 9:07 AM EDT  by a video enabled telemedicine application and verified that I am speaking with the correct person using two identifiers.  Location: Patient: Home Provider: [facility] Outpatient [place] office    I discussed the limitations of evaluation and management by telemedicine and the availability of in person appointments. The patient expressed understanding and agreed to proceed.   I provided 51 minutes of non-face-to-face time during this encounter.   [name], LCSW    THERAPIST PROGRESS NOTE       Session Time: Tuesday  09/26/2022 9:07 AM - 9:58 AM   Participation Level: Active  Behavioral Response: CasualAlert/  Type of Therapy: Individual Therapy  Treatment Goals addressed: Susan Ball will reduce the amount of anger related incident/outburst by 50% for 4 consecutive weeks/Susan Ball will identify situations, thoughts, and feelings that trigger internal anger, angry verbal and or aggressive behavioral actions as evidenced by self recorded report  Progress on Goals: not Progressing   Interventions: CBT and Supportive  Summary: Susan Ball is a 52 y.o. female who  is referred for services by Susan Ball. She just completed IOP on March 27, 2019. She has long standing history of bipolar disorder and borderline personality disorder. She has had 3 psychiatric hospitalizations. The last was in 2010 at [facility] due to suicide attempt. She participated in outpatient therapy intermittently at [facility] for 3 years. She last was seen there 6 months ago.  Current symptoms include depressed mood, crying spells, mood swings, aggression, anger, excessive worry, sleep difficulty, poor concentration, and memory difficulty.  Patient was seen via virtual visit about 2 weeks ago. She reports continued mood swings since last session.  She states being okay and able to self regulate while at work as she stays focused  on her responsibilities.  She reports using self talk and staying in the moment to manage distress while at work.  However, patient reports having mixed episodes when not at work.  Per her report, trigger appears to be continued discord in her marriage as husband does not participate in activities or exercise.  She also expresses frustration as husband is not responsive to her needs although she has expressed her concerns several times.  Patient reports feelings of rejection and hurt.  This results in patient participating in behaviors been consistent with her values.  She is pleased she has reduced marijuana use to about a half a blunt 2 to 3 days/week.  Patient reports experiencing 2 incidents of road rage since last session.  She reports she did not use any of the coping strategies discussed in previous sessions.  Patient reports she did not receive previously mailed handouts and due to incorrect address.  Patient reports passive SI at times but denies any plan or intent.  Suicidal/Homicidal:  No, without intent or plan.  Patient agrees to call 911, [phone], I have someone take her to the ED should symptoms worsen.  Therapist Response:  reviewed symptoms, assisted patient identify triggers of mood changes, discussed stressors, facilitated expression of thoughts and feelings, validated feelings, assisted patient examine the behavioral chain of involvement in behavior inconsistent with her values, assisted patient identify ways to intervene to disrupt engaging in exercise away from, avoiding certain place, also developed plan with patient to use DBT handouts to cope with distressful feelings, assisted patient identify realistic expectations regarding husband, praised and reinforced patient's efforts to decrease marijuana use, will send patient symptom  monitoring forms in preparation for next session plan: Return again in 2 weeks.  Diagnosis: Axis I: Bipolar Disorder    Axis II: Borderline Personality  Dis.  Collaboration of Care: Psychiatrist AEB patient seeing psychiatrist Dr. Lolly Mustache, clinician reviewing chart  Patient/Guardian was advised Release of Information must be obtained prior to any record release in order to collaborate their care with an outside provider. Patient/Guardian was advised if they have not already done so to contact the registration department to sign all necessary forms in order for [name] to release information regarding their care.   Consent: Patient/Guardian gives verbal consent for treatment and assignment of benefits for services provided during this visit. Patient/Guardian expressed understanding and agreed to proceed.   [name], LCSW 09/26/2022

## 2022-10-10 ENCOUNTER — Ambulatory Visit (INDEPENDENT_AMBULATORY_CARE_PROVIDER_SITE_OTHER): Payer: 59 | Admitting: Psychiatry

## 2022-10-10 DIAGNOSIS — F603 Borderline personality disorder: Secondary | ICD-10-CM

## 2022-10-10 DIAGNOSIS — F319 Bipolar disorder, unspecified: Secondary | ICD-10-CM | POA: Diagnosis not present

## 2022-10-10 NOTE — Progress Notes (Signed)
Virtual Visit via Video Note  I connected with Susan Ball on 10/10/22 at 9:10 AM EDT  by a video enabled telemedicine application and verified that I am speaking with the correct person using two identifiers.  Location: Patient: Home Provider:  [facility] [place] [facility]    I discussed the limitations of evaluation and management by telemedicine and the availability of in person appointments. The patient expressed understanding and agreed to proceed.  I provided 45 minutes of non-face-to-face time during this encounter.   Susan Salvage, LCSW   THERAPIST PROGRESS NOTE       Session Time: Tuesday  10/10/2022 9:10 AM  - 9:55 AM   Participation Level: Active  Behavioral Response: CasualAlert/  Type of Therapy: Individual Therapy  Treatment Goals addressed: Susan Ball will reduce the amount of anger related incident/outburst by 50% for 4 consecutive weeks/Susan Ball will identify situations, thoughts, and feelings that trigger internal anger, angry verbal and or aggressive behavioral actions as evidenced by self recorded report  Progress on Goals: not Progressing   Interventions: CBT and Supportive  Summary: Susan Ball is a 52 y.o. female who  is referred for services by Susan Ball. She just completed IOP on March 27, 2019. She has long standing history of bipolar disorder and borderline personality disorder. She has had 3 psychiatric hospitalizations. The last was in 2010 at [facility] due to suicide attempt. She participated in outpatient therapy intermittently at [facility] for 3 years. She last was seen there 6 months ago.  Current symptoms include depressed mood, crying spells, mood swings, aggression, anger, excessive worry, sleep difficulty, poor concentration, and memory difficulty.  Patient was seen via virtual visit about 2 weeks ago. She reports continued mood swings and increased irritability since last session.  She reports lashing out as well as experiencing road  rage.  She has been using symptom monitoring form and reports noticing increased sleep deprivation.  She attributes this to her sleeping schedule as well as ruminating thoughts.  Patient reports husband is going with her to the [facility] Sundays and is participating in exercises.  However, she expresses disappointment that he does not appear to want to be there.  She reports additional stress related to husband accusing her of infidelity which patient denies.patient reports continued reduced marijuana use to about a half a blunt 2 days/week.   Suicidal/Homicidal:  No, without intent or plan.  Patient agrees to call [phone], [phone], I have someone take her to the ED should symptoms worsen.  Therapist Response:  reviewed symptoms, assisted patient identify triggers of mood changes, discussed stressors, facilitated expression of thoughts and feelings, validated feelings, assisted patient identify ways to improve sleep hygiene, discussed rationale for and provided instructions on how to use leaves on a stream exercise to cope with ruminating thoughts, developed plan with patient to practice just prior to bedtime, praised and reinforced patient's efforts to maintain behavioral activation and going to the [facility], assisted patient identify realistic expectations of husband regarding his enjoyment of the [employer] , raised and reinforced patient's use of symptoms since monitoring form and developed plan with patient to continue using/bring to next session  plan: Return again in 2 weeks.  Diagnosis: Axis I: Bipolar Disorder    Axis II: Borderline Personality Dis.  Collaboration of Care: Psychiatrist AEB patient seeing psychiatrist Susan Ball, clinician reviewing chart  Patient/Guardian was advised Release of Information must be obtained prior to any record release in order to collaborate their care with an outside provider. Patient/Guardian was  advised if they have not already done so to contact the registration department to  sign all necessary forms in order for [name] to release information regarding their care.   Consent: Patient/Guardian gives verbal consent for treatment and assignment of benefits for services provided during this visit. Patient/Guardian expressed understanding and agreed to proceed.   Susan Salvage, LCSW 10/10/2022

## 2022-10-24 ENCOUNTER — Ambulatory Visit (INDEPENDENT_AMBULATORY_CARE_PROVIDER_SITE_OTHER): Payer: 59 | Admitting: Psychiatry

## 2022-10-24 DIAGNOSIS — F603 Borderline personality disorder: Secondary | ICD-10-CM

## 2022-10-24 DIAGNOSIS — F4310 Post-traumatic stress disorder, unspecified: Secondary | ICD-10-CM | POA: Diagnosis not present

## 2022-10-24 DIAGNOSIS — F6089 Other specific personality disorders: Secondary | ICD-10-CM

## 2022-10-24 DIAGNOSIS — F319 Bipolar disorder, unspecified: Secondary | ICD-10-CM | POA: Diagnosis not present
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
## 2022-11-21 ENCOUNTER — Ambulatory Visit (HOSPITAL_COMMUNITY): Payer: 59 | Admitting: Psychiatry

## 2022-11-27 ENCOUNTER — Encounter (HOSPITAL_COMMUNITY): Payer: Self-pay | Admitting: Psychiatry

## 2022-11-27 ENCOUNTER — Telehealth (HOSPITAL_BASED_OUTPATIENT_CLINIC_OR_DEPARTMENT_OTHER): Payer: 59 | Admitting: Psychiatry

## 2022-11-27 VITALS — Wt 260.0 lb

## 2022-11-27 DIAGNOSIS — F4310 Post-traumatic stress disorder, unspecified: Secondary | ICD-10-CM

## 2022-11-27 DIAGNOSIS — F419 Anxiety disorder, unspecified: Secondary | ICD-10-CM | POA: Diagnosis not present

## 2022-11-27 DIAGNOSIS — F609 Personality disorder, unspecified: Secondary | ICD-10-CM

## 2022-11-27 DIAGNOSIS — F319 Bipolar disorder, unspecified: Secondary | ICD-10-CM | POA: Diagnosis not present

## 2022-11-27 DIAGNOSIS — F6089 Other specific personality disorders: Secondary | ICD-10-CM

## 2022-11-27 MED ORDER — LITHIUM CARBONATE ER 450 MG PO TBCR: EXTENDED_RELEASE_TABLET | ORAL | 2 refills | Status: DC

## 2022-11-27 MED ORDER — FLUOXETINE HCL 20 MG PO CAPS: 20.0000 mg | ORAL_CAPSULE | Freq: Every day | ORAL | 0 refills | Status: DC

## 2022-11-27 MED ORDER — VORTIOXETINE HBR 20 MG PO TABS: 20.0000 mg | ORAL_TABLET | Freq: Every day | ORAL | 0 refills | Status: DC

## 2022-11-27 MED ORDER — CLONIDINE HCL 0.2 MG PO TABS: 0.2000 mg | ORAL_TABLET | Freq: Every day | ORAL | 0 refills | Status: DC
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
## 2022-11-28 ENCOUNTER — Other Ambulatory Visit (HOSPITAL_COMMUNITY): Payer: Self-pay | Admitting: *Deleted

## 2022-11-28 DIAGNOSIS — Z79899 Other long term (current) drug therapy: Secondary | ICD-10-CM

## 2022-11-28 DIAGNOSIS — Z5181 Encounter for therapeutic drug level monitoring: Secondary | ICD-10-CM

## 2022-12-05 ENCOUNTER — Telehealth (HOSPITAL_COMMUNITY): Payer: Self-pay | Admitting: Psychiatry

## 2022-12-05 ENCOUNTER — Ambulatory Visit (HOSPITAL_COMMUNITY): Payer: 59 | Admitting: Psychiatry

## 2022-12-05 NOTE — Telephone Encounter (Signed)
Therapist attempted to contact patient via text through caregility platform for scheduled appointment, no response.  Therapist called patient, left message indicating attempt and requesting patient call office.

## 2022-12-14 ENCOUNTER — Ambulatory Visit: Payer: Self-pay

## 2022-12-15 DIAGNOSIS — N390 Urinary tract infection, site not specified: Secondary | ICD-10-CM | POA: Diagnosis not present

## 2022-12-29 ENCOUNTER — Ambulatory Visit (INDEPENDENT_AMBULATORY_CARE_PROVIDER_SITE_OTHER): Payer: 59 | Admitting: Psychiatry

## 2022-12-29 DIAGNOSIS — F4310 Post-traumatic stress disorder, unspecified: Secondary | ICD-10-CM

## 2022-12-29 DIAGNOSIS — F319 Bipolar disorder, unspecified: Secondary | ICD-10-CM

## 2022-12-29 DIAGNOSIS — F6089 Other specific personality disorders: Secondary | ICD-10-CM | POA: Diagnosis not present

## 2022-12-29 NOTE — Progress Notes (Unsigned)
Virtual Visit via Video Note  I connected with Susan Ball on 12/29/22 at 10:06 AM EST  by a video enabled telemedicine application and verified that I am speaking with the correct person using two identifiers.  Location: Patient: Home Provider: [facility] [place] office    I discussed the limitations of evaluation and management by telemedicine and the availability of in person appointments. The patient expressed understanding and agreed to proceed.   I provided 52 minutes of non-face-to-face time during this encounter.   [name], LCSW    THERAPIST PROGRESS NOTE       Session Time: Friday  12/29/2022  10:06 AM -  10:58 AM   Participation Level: Active  Behavioral Response: CasualAlert/  Type of Therapy: Individual Therapy  Treatment Goals addressed: Susan Ball will reduce the amount of anger related incident/outburst by 50% for 4 consecutive weeks/Susan Ball will identify situations, thoughts, and feelings that trigger internal anger, angry verbal and or aggressive behavioral actions as evidenced by self recorded report  Progress on Goals: not progressing   Interventions: CBT and Supportive  Summary: Susan Ball is a 52 y.o. female who  is referred for services by [name]. She just completed IOP on March 27, 2019. She has long standing history of bipolar disorder and borderline personality disorder. She has had 3 psychiatric hospitalizations. The last was in 2010 at [facility] due to suicide attempt. She participated in outpatient therapy intermittently at [facility] for 3 years. She last was seen there 6 months ago.  Current symptoms include depressed mood, crying spells, mood swings, aggression, anger, excessive worry, sleep difficulty, poor concentration, and memory difficulty.  Patient was seen via virtual visit about 2 months ago. She reports continued mood swings and increased irritability since last session.  She reports lashing out as well as experiencing  road rage.  She has been using symptom monitoring form and reports noticing increased sleep deprivation.  She attributes this to her sleeping schedule as well as ruminating thoughts.  Patient reports husband is going with her to the [facility] Sundays and is participating in exercises.  However, she expresses disappointment that he does not appear to want to be there.  She reports additional stress related to husband accusing her of infidelity which patient denies.patient reports continued reduced marijuana use to about a half a blunt 2 days/week.   Suicidal/Homicidal:  No, without intent or plan.  Patient agrees to call 911, [phone], I have someone take her to the ED should symptoms worsen.  Therapist Response:  reviewed symptoms, assisted patient identify triggers of mood changes, discussed stressors, facilitated expression of thoughts and feelings, validated feelings, assisted patient identify ways to improve sleep hygiene, discussed rationale for and provided instructions on how to use leaves on a stream exercise to cope with ruminating thoughts, developed plan with patient to practice just prior to bedtime, praised and reinforced patient's efforts to maintain behavioral activation and going to the [facility], assisted patient identify realistic expectations of husband regarding his enjoyment of the [employer] , raised and reinforced patient's use of symptoms since monitoring form and developed plan with patient to continue using/bring to next session  plan: Return again in 2 weeks.  Diagnosis: Axis I: Bipolar Disorder    Axis II: Borderline Personality Dis.  Collaboration of Care: Psychiatrist AEB patient seeing psychiatrist Dr. Lolly Mustache, clinician reviewing chart  Patient/Guardian was advised Release of Information must be obtained prior to any record release in order to collaborate their care with an outside provider.  Patient/Guardian was advised if they have not already done so to contact the registration department  to sign all necessary forms in order for [name] to release information regarding their care.   Consent: Patient/Guardian gives verbal consent for treatment and assignment of benefits for services provided during this visit. Patient/Guardian expressed understanding and agreed to proceed.   [name], LCSW 12/29/2022

## 2023-01-01 ENCOUNTER — Telehealth (HOSPITAL_COMMUNITY): Payer: Self-pay | Admitting: *Deleted

## 2023-01-01 NOTE — Telephone Encounter (Signed)
Lab orders faxed to [facility] at [facility], [place], per pt request. LVM to advise pt.

## 2023-01-05 DIAGNOSIS — Z79899 Other long term (current) drug therapy: Secondary | ICD-10-CM | POA: Diagnosis not present

## 2023-01-05 DIAGNOSIS — Z5181 Encounter for therapeutic drug level monitoring: Secondary | ICD-10-CM | POA: Diagnosis not present

## 2023-01-06 LAB — CBC WITH DIFFERENTIAL/PLATELET
Basophils Absolute: 0.1 10*3/uL (ref 0.0–0.2)
Basos: 2 %
EOS (ABSOLUTE): 0.1 10*3/uL (ref 0.0–0.4)
Eos: 1 %
Hematocrit: 40.6 % (ref 34.0–46.6)
Hemoglobin: 13.3 g/dL (ref 11.1–15.9)
Immature Grans (Abs): 0 10*3/uL (ref 0.0–0.1)
Immature Granulocytes: 0 %
Lymphocytes Absolute: 1.7 10*3/uL (ref 0.7–3.1)
Lymphs: 27 %
MCH: 29.3 pg (ref 26.6–33.0)
MCHC: 32.8 g/dL (ref 31.5–35.7)
MCV: 89 fL (ref 79–97)
Monocytes Absolute: 0.4 10*3/uL (ref 0.1–0.9)
Monocytes: 7 %
Neutrophils Absolute: 3.9 10*3/uL (ref 1.4–7.0)
Neutrophils: 63 %
Platelets: 261 10*3/uL (ref 150–450)
RBC: 4.54 x10E6/uL (ref 3.77–5.28)
RDW: 13.2 % (ref 11.7–15.4)
WBC: 6.3 10*3/uL (ref 3.4–10.8)

## 2023-01-06 LAB — CMP14+EGFR
ALT: 15 [IU]/L (ref 0–32)
AST: 18 [IU]/L (ref 0–40)
Albumin: 4.1 g/dL (ref 3.8–4.9)
Alkaline Phosphatase: 64 [IU]/L (ref 44–121)
BUN/Creatinine Ratio: 8 — ABNORMAL LOW (ref 9–23)
BUN: 8 mg/dL (ref 6–24)
Bilirubin Total: 0.5 mg/dL (ref 0.0–1.2)
CO2: 22 mmol/L (ref 20–29)
Calcium: 10 mg/dL (ref 8.7–10.2)
Chloride: 103 mmol/L (ref 96–106)
Creatinine, Ser: 0.97 mg/dL (ref 0.57–1.00)
Globulin, Total: 2 g/dL (ref 1.5–4.5)
Glucose: 68 mg/dL — ABNORMAL LOW (ref 70–99)
Potassium: 4.5 mmol/L (ref 3.5–5.2)
Sodium: 139 mmol/L (ref 134–144)
Total Protein: 6.1 g/dL (ref 6.0–8.5)
eGFR: 69 mL/min/{1.73_m2} (ref >59)

## 2023-01-06 LAB — LITHIUM LEVEL: Lithium Lvl: 1 mmol/L (ref 0.5–1.2)

## 2023-01-06 LAB — HEMOGLOBIN A1C
Est. average glucose Bld gHb Est-mCnc: 97 mg/dL
Hgb A1c MFr Bld: 5 % (ref 4.8–5.6)

## 2023-01-12 ENCOUNTER — Ambulatory Visit (INDEPENDENT_AMBULATORY_CARE_PROVIDER_SITE_OTHER): Payer: 59 | Admitting: Psychiatry

## 2023-01-12 DIAGNOSIS — F319 Bipolar disorder, unspecified: Secondary | ICD-10-CM | POA: Diagnosis not present

## 2023-01-12 DIAGNOSIS — F6089 Other specific personality disorders: Secondary | ICD-10-CM | POA: Diagnosis not present

## 2023-01-12 NOTE — Progress Notes (Signed)
Virtual Visit via Video Note  I connected with Susan Ball on 01/12/23 at 10:06 AM EST  by a video enabled telemedicine application and verified that I am speaking with the correct person using two identifiers.  Location: Patient: Home Provider: [facility] [place] office    I discussed the limitations of evaluation and management by telemedicine and the availability of in person appointments. The patient expressed understanding and agreed to proceed.   I provided 47 minutes of non-face-to-face time during this encounter.   [name], LCSW    THERAPIST PROGRESS NOTE       Session Time: Friday  01/12/2023  10:06 AM -  10:53 AM   Participation Level: Active  Behavioral Response: CasualAlert/  Type of Therapy: Individual Therapy  Treatment Goals addressed: [name] will reduce the amount of anger related incident/outburst by 50% for 4 consecutive weeks/Jhordan will identify situations, thoughts, and feelings that trigger internal anger, angry verbal and or aggressive behavioral actions as evidenced by self recorded report  Progress on Goals: not progressing   Interventions: CBT and Supportive  Summary: [name] is a 52 y.o. female who  is referred for services by [name]. She just completed IOP on March 27, 2019. She has long standing history of bipolar disorder and borderline personality disorder. She has had 3 psychiatric hospitalizations. The last was in 2010 at [facility] due to suicide attempt. She participated in outpatient therapy intermittently at [facility] for 3 years. She last was seen there 6 months ago.  Current symptoms include depressed mood, crying spells, mood swings, aggression, anger, excessive worry, sleep difficulty, poor concentration, and memory difficulty.  Patient was seen via virtual visit about 2 weeks ago. She reports states mood has been up and down. She reports experiencing decreased passive suicidal ideations, denies any plan or  intent to harm self. She still has not contacted Social Services as she dreads making the call. She reports increased acceptance of husband's choices regarding exercising. She reports no longer pushing him to go to the gym. She now goes alone and reports enjoying her time at the gym.  She and husband are planning to go to [place], [place] to spend time with his family for the holidays. Pt reports dreading this as she worries about accommodations at his aunt's home where they will be residing.  They will be staying on second floor and husband has difficulty going down stairs. She also worries she may not have privacy. She reports still experiencing rage/anger and distressful feelings at times, cites recent examples.   Suicidal/Homicidal:  No, without intent or plan.  Patient agrees to call 911, [phone], I have someone take her to the ED should symptoms worsen.  Therapist Response:  reviewed symptoms, praised and reinforced patient's increased acceptance of husband's choices, discussed effects on her thoughts/mood/behavior, discussed stressors, facilitated expression of thoughts and feelings, validated feelings, assisted patient patient identify and address thoughts and processes that inhibited patient calling DSS, developed plan with patient to call DSS, assisted patient identify possible ways to address accommodation issues when staying with husband's family for the holidays, assisted patient identify ways to use her support system while in [place], discussed rationale for and developed plan with patient to use daily planning, discussed rationale for and assisted patient identify how to use DBT technique (TIPP) to cope with distressful feelings.   plan: Return again in 2 weeks.  Diagnosis: Axis I: Bipolar Disorder    Axis II: Borderline Personality Dis.  Collaboration of Care:  Psychiatrist AEB patient seeing psychiatrist Dr. Lolly Mustache, clinician reviewing chart  Patient/Guardian was advised Release of Information  must be obtained prior to any record release in order to collaborate their care with an outside provider. Patient/Guardian was advised if they have not already done so to contact the registration department to sign all necessary forms in order for [name] to release information regarding their care.   Consent: Patient/Guardian gives verbal consent for treatment and assignment of benefits for services provided during this visit. Patient/Guardian expressed understanding and agreed to proceed.   [name], LCSW 01/12/2023

## 2023-01-21 ENCOUNTER — Encounter: Payer: Self-pay | Admitting: Emergency Medicine

## 2023-01-21 ENCOUNTER — Ambulatory Visit
Admission: EM | Admit: 2023-01-21 | Discharge: 2023-01-21 | Disposition: A | Discharge status: Home / Self Care | Payer: 59 | Attending: Family Medicine | Admitting: Family Medicine

## 2023-01-21 ENCOUNTER — Other Ambulatory Visit: Payer: Self-pay

## 2023-01-21 DIAGNOSIS — J0100 Acute maxillary sinusitis, unspecified: Secondary | ICD-10-CM | POA: Diagnosis not present

## 2023-01-21 MED ORDER — DEXAMETHASONE SODIUM PHOSPHATE 10 MG/ML IJ SOLN
10.0000 mg | Freq: Once | INTRAMUSCULAR | Status: AC
  Administered 2023-01-21: 10 mg via INTRAMUSCULAR

## 2023-01-21 MED ORDER — AMOXICILLIN-POT CLAVULANATE 875-125 MG PO TABS: 1.0000 | ORAL_TABLET | Freq: Two times a day (BID) | ORAL | 0 refills | Status: DC

## 2023-01-21 NOTE — ED Triage Notes (Addendum)
Pt reports nasal congestion, bilateral ear pain, nausea, facial swelling/pain x1.5 weeks. Denies any known nausea.

## 2023-01-21 NOTE — ED Provider Notes (Signed)
[facility]    CSN: [id number] Arrival date & time: 01/21/23  1235      History   Chief Complaint Chief Complaint  Patient presents with   Nasal Congestion    HPI Susan Ball is a 52 y.o. female.   Presenting today with almost 2 weeks of progressively worsening nasal congestion, bilateral ear pain and pressure, sinus pain and pressure, nausea, vomiting.  Denies fever, chills, chest pain, shortness of breath, abdominal pain.  So far trying Mucinex, Nettie pot, nasal sprays with minimal relief.    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      Home Medications    Prior to Admission medications   Medication Sig Start Date End Date Taking? Authorizing Provider  amoxicillin-clavulanate (AUGMENTIN) 875-125 MG tablet Take 1 tablet by mouth every 12 (twelve) hours. 01/21/23  Yes Particia Nearing, PA-C  albuterol (VENTOLIN HFA) 108 (90 Base) MCG/ACT inhaler SMARTSIG:2 Puff(s) By Mouth 4 Times Daily PRN 11/03/20   [provider]  calcium carbonate (OS-CAL - DOSED  IN MG OF ELEMENTAL CALCIUM) 1250 (500 Ca) MG tablet Take 1 tablet by mouth daily with breakfast.     [provider]  cephALEXin (KEFLEX) 500 MG capsule Take 1 capsule (500 mg total) by mouth 2 (two) times daily. 05/23/22   Mardella Layman, MD  Cholecalciferol 25 MCG (1000 UT) capsule Take 1,000 Units by mouth daily.     [provider]  cloNIDine (CATAPRES) 0.2 MG tablet Take 1 tablet (0.2 mg total) by mouth at bedtime. 11/27/22 02/25/23  Cleotis Nipper, MD  Ferrous Sulfate (SLOW FE) 142 (45 Fe) MG TBCR 1 tablet 11/03/20   [provider]  FLUoxetine (PROZAC) 20 MG capsule Take 1 capsule (20 mg total) by mouth daily. 11/27/22 02/25/23  Cleotis Nipper, MD  fluticasone Aleda Grana) 50 MCG/ACT nasal spray 2 sprays 05/23/22   Mardella Layman, MD  lithium carbonate (ESKALITH) 450 MG ER tablet Take one tab twice a day. 11/27/22   [name], Phillips Grout, MD  solifenacin (VESICARE) 5 MG tablet Take 5 mg by mouth daily. 09/21/20   [provider]  Trimethoprim HCl (TRIMPEX PO) Take by mouth.    [provider]  vortioxetine HBr (TRINTELLIX) 20 MG TABS tablet Take 1 tablet (20 mg total) by mouth daily. 11/27/22   [name], Phillips Grout, MD    Family History Family History  Problem Relation Age of Onset   Depression Sister    Depression Brother     Social History Social History   Tobacco Use   Smoking status: Never  Smokeless tobacco: Never  Vaping Use   Vaping status: Never Used  Substance Use Topics   Alcohol use: Not Currently   Drug use: Not Currently    Types: Marijuana    Comment: States she stopped smoking THC in 2002     Allergies   Fexofenadine and Lamictal [lamotrigine]   Review of Systems Review of Systems PER HPI  Physical Exam Triage Vital Signs ED Triage Vitals  Encounter Vitals Group     BP 01/21/23 1311 129/72     Systolic BP Percentile --      Diastolic BP Percentile --      Pulse Rate 01/21/23 1311 75     Resp 01/21/23 1311 20     Temp 01/21/23  1311 98.3 F (36.8 C)     Temp Source 01/21/23 1311 Oral     SpO2 01/21/23 1311 98 %     Weight --      Height --      Head Circumference --      Peak Flow --      Pain Score 01/21/23 1310 6     Pain Loc --      Pain Education --      Exclude from Growth Chart --    No data found.  Updated Vital Signs BP 129/72 (BP Location: Right Arm)   Pulse 75   Temp 98.3 F (36.8 C) (Oral)   Resp 20   LMP 01/07/2023 (Approximate)   SpO2 98%   Visual Acuity Right Eye Distance:   Left Eye Distance:   Bilateral Distance:    Right Eye Near:   Left Eye Near:    Bilateral Near:     Physical Exam Vitals and nursing note reviewed.  Constitutional:      Appearance: Normal appearance.  HENT:     Head: Atraumatic.     Right Ear: Tympanic membrane and external ear normal.     Left Ear: Tympanic membrane and external ear normal.     Nose: Congestion present.     Mouth/Throat:     Mouth: Mucous membranes are moist.     Pharynx: Posterior oropharyngeal erythema present.  Eyes:     Extraocular Movements: Extraocular movements intact.     Conjunctiva/sclera: Conjunctivae normal.  Cardiovascular:     Rate and Rhythm: Normal rate and regular rhythm.     Heart sounds: Normal heart sounds.  Pulmonary:     Effort: Pulmonary effort is normal.     Breath sounds: Normal breath sounds. No wheezing or rales.  Musculoskeletal:        General: Normal range of motion.     Cervical back: Normal range of motion and neck supple.  Skin:    General: Skin is warm and dry.  Neurological:     Mental Status: She is alert and oriented to person, place, and time.  Psychiatric:        Mood and Affect: Mood normal.        Thought Content: Thought content normal.      UC Treatments / Results  Labs (all labs ordered are listed, but only abnormal results are displayed) Labs Reviewed - No data to display  EKG   Radiology No results found.  Procedures Procedures (including critical care  time)  Medications Ordered in UC Medications  dexamethasone (DECADRON) injection 10 mg (10 mg Intramuscular Given 01/21/23 1356)    Initial Impression / Assessment and Plan / UC Course  I have reviewed the triage vital  signs and the nursing notes.  Pertinent labs & imaging results that were available during my care of the patient were reviewed by me and considered in my medical decision making (see chart for details).     Vitals and exam very reassuring today, given duration worsening course treat with IM Decadron, Augmentin, supportive over-the-counter medications and home care.  Return for worsening symptoms.  Final Clinical Impressions(s) / UC Diagnoses   Final diagnoses:  Acute non-recurrent maxillary sinusitis   Discharge Instructions   None    ED Prescriptions     Medication Sig Dispense Auth. Provider   amoxicillin-clavulanate (AUGMENTIN) 875-125 MG tablet Take 1 tablet by mouth every 12 (twelve) hours. 14 tablet Particia Nearing, [place]      PDMP not reviewed this encounter.   Particia Nearing, [place] 01/21/23 1357

## 2023-01-26 ENCOUNTER — Other Ambulatory Visit (HOSPITAL_COMMUNITY): Payer: Self-pay | Admitting: Psychiatry

## 2023-01-26 DIAGNOSIS — F319 Bipolar disorder, unspecified: Secondary | ICD-10-CM

## 2023-01-26 DIAGNOSIS — F419 Anxiety disorder, unspecified: Secondary | ICD-10-CM

## 2023-01-29 ENCOUNTER — Ambulatory Visit (HOSPITAL_COMMUNITY): Payer: 59 | Admitting: Psychiatry

## 2023-02-13 ENCOUNTER — Ambulatory Visit (HOSPITAL_COMMUNITY): Payer: 59 | Admitting: Psychiatry

## 2023-02-13 DIAGNOSIS — F319 Bipolar disorder, unspecified: Secondary | ICD-10-CM

## 2023-02-13 DIAGNOSIS — F6089 Other specific personality disorders: Secondary | ICD-10-CM

## 2023-02-13 NOTE — Progress Notes (Signed)
Virtual Visit via Video Note  I connected with Susan Ball on 02/13/23 at 8:08 AM EST by a video enabled telemedicine application and verified that I am speaking with the correct person using two identifiers.  Location: Patient: Home Provider: [facility] [place] office    I discussed the limitations of evaluation and management by telemedicine and the availability of in person appointments. The patient expressed understanding and agreed to proceed.   I provided 45 minutes of non-face-to-face time during this encounter.   [name], LCSW V   THERAPIST PROGRESS NOTE       Session Time: Tuesday 02/13/2023 8:08 AM -  8:53 AM   Participation Level: Active  Behavioral Response: CasualAlert/  Type of Therapy: Individual Therapy  Treatment Goals addressed: [name] will reduce the amount of anger related incident/outburst by 50% for 4 consecutive weeks/Zakyah will identify situations, thoughts, and feelings that trigger internal anger, angry verbal and or aggressive behavioral actions as evidenced by self recorded report  Progress on Goals: not progressing   Interventions: CBT and Supportive  Summary: Susan Ball is a 53 y.o. female who  is referred for services by Susan Ball. She just completed IOP on March 27, 2019. She has long standing history of bipolar disorder and borderline personality disorder. She has had 3 psychiatric hospitalizations. The last was in 2010 at [facility] due to suicide attempt. She participated in outpatient therapy intermittently at [facility] for 3 years. She last was seen there 6 months ago.  Current symptoms include depressed mood, crying spells, mood swings, aggression, anger, excessive worry, sleep difficulty, poor concentration, and memory difficulty.  Patient was seen via virtual visit about 4 weeks ago. Pt reports being very sick during the holidays. She and husband were in [place] during that time but pt reports being in bed most of the  time. She reports avoiding anger outbursts although her mother-in-law made negative comments to pt when pt wanted to leave to come back to [facility]. Pt reports using self-talk and later making a FaceBook post, She reports she was mainly assertive in the post but did make some aggressive comments. Mother-in-law later made amends. Pt reports decreased intensity and frequency of anger outbursts overall. She reports improved self-care since she has been recovering from being sick. She has resumed attendance at the gym and has let go of expectation of husband going to the gym. She is pleased with her current routine. Pt also reports taking small breaks for self during the day and giving self time to reset.   Suicidal/Homicidal:  No, without intent or plan.  Patient agrees to call 911, [phone], I have someone take her to the ED should symptoms worsen.  Therapist Response:  reviewed symptoms, discussed stressors, facilitated pt expressing thoughts and feelings, validated feelings, praised and reinforced pt's use of self-talk and assertiveness skills, assisted pt examine her pattern of interaction with husband when she was sick, assisted pt identify effects on husband's behavior in doing things for self, assisted pt identify ways to set and maintain limits regarding helping vs enabling husband, assisted pt identify benefits of setting and maintaining limits, assisted pt identify ways to maintain consistent self-care, discussed pausing before responding, plan: Return again in 2 weeks.  Diagnosis: Axis I: Bipolar Disorder    Axis II: Borderline Personality Dis.  Collaboration of Care: Psychiatrist AEB patient seeing psychiatrist Dr. Lolly Mustache, clinician reviewing chart  Patient/Guardian was advised Release of Information must be obtained prior to any record release in order to  collaborate their care with an outside provider. Patient/Guardian was advised if they have not already done so to contact the registration department  to sign all necessary forms in order for Susan Ball to release information regarding their care.   Consent: Patient/Guardian gives verbal consent for treatment and assignment of benefits for services provided during this visit. Patient/Guardian expressed understanding and agreed to proceed.   [name], LCSW 02/13/2023

## 2023-02-27 ENCOUNTER — Ambulatory Visit (INDEPENDENT_AMBULATORY_CARE_PROVIDER_SITE_OTHER): Payer: 59 | Admitting: Psychiatry

## 2023-02-27 ENCOUNTER — Ambulatory Visit (HOSPITAL_COMMUNITY): Payer: 59

## 2023-02-27 DIAGNOSIS — F319 Bipolar disorder, unspecified: Secondary | ICD-10-CM | POA: Diagnosis not present

## 2023-02-27 NOTE — Progress Notes (Signed)
 Virtual Visit via Video Note  I connected with [facility] on 02/27/23 at 8:11 AM EST  by a video enabled telemedicine application and verified that I am speaking with the correct person using two identifiers.  Location: Patient: Home Provider: [facility] [place] office    I discussed the limitations of evaluation and management by telemedicine and the availability of in person appointments. The patient expressed understanding and agreed to proceed.   I provided 44 minutes of non-face-to-face time during this encounter.   Winton FORBES Rubinstein, LCSW   THERAPIST PROGRESS NOTE       Session Time: Tuesday 02/27/2023 8:11 AM -  8:55 AM   Participation Level: Active  Behavioral Response: CasualAlert/  Type of Therapy: Individual Therapy  Treatment Goals addressed: Jaana will reduce the amount of anger related incident/outburst by 50% for 4 consecutive weeks/Rozalynn will identify situations, thoughts, and feelings that trigger internal anger, angry verbal and or aggressive behavioral actions as evidenced by self recorded report  Progress on Goals: not progressing   Interventions: CBT and Supportive  Summary: Susan [facility] is a 53 y.o. female who  is referred for services by Ricka Gaskins. She just completed IOP on March 27, 2019. She has long standing history of bipolar disorder and borderline personality disorder. She has had 3 psychiatric hospitalizations. The last was in 2010 at [facility] due to suicide attempt. She participated in outpatient therapy intermittently at [facility] for 3 years. She last was seen there 6 months ago.  Current symptoms include depressed mood, crying spells, mood swings, aggression, anger, excessive worry, sleep difficulty, poor concentration, and memory difficulty.  Patient was seen via virtual visit about 2-3 weeks ago. Pt reports increased irritability and lashing out since last session. She reports increased road rage and impulsivity regarding verbal  aggression. She reports not taking medication for about 4-5  days as she ran out but now has resumed taking medication about 5 days ago. She reports increased stress  regarding husband as he still is not doing things within his capability per her report. She expresses anger and resentment. She has been scheduling more time for self and continues to go the gym. Pt reports forgetting to use TIPP but has been using deep breathing as an intervention.   Suicidal/Homicidal:  No, without intent or plan.  Patient agrees to call 911, [phone], I have someone take her to the ED should symptoms worsen.  Therapist Response:  reviewed symptoms, discussed stressors, facilitated pt expressing thoughts and feelings, validated feelings, praised and reinforced pt's efforts to schedule time for self,  praised and reinforced pt's efforts to use deep breathing as an intervention, discussed rationale for practicing deep breathing regularly and developed plan with pt to practice daily, encouraged medication compliance, reviewed TIPP and developed plan with pt to practice,   plan: Return again in 2 weeks.  Diagnosis: Axis I: Bipolar Disorder    Axis II: Borderline Personality Dis.  Collaboration of Care: Psychiatrist AEB patient seeing psychiatrist Dr. Curry, clinician reviewing chart  Patient/Guardian was advised Release of Information must be obtained prior to any record release in order to collaborate their care with an outside provider. Patient/Guardian was advised if they have not already done so to contact the registration department to sign all necessary forms in order for us  to release information regarding their care.   Consent: Patient/Guardian gives verbal consent for treatment and assignment of benefits for services provided during this visit. Patient/Guardian expressed understanding and agreed to proceed.  Winton FORBES Rubinstein, LCSW 02/27/2023

## 2023-03-02 ENCOUNTER — Telehealth (HOSPITAL_BASED_OUTPATIENT_CLINIC_OR_DEPARTMENT_OTHER): Payer: 59 | Admitting: Psychiatry

## 2023-03-02 ENCOUNTER — Encounter (HOSPITAL_COMMUNITY): Payer: Self-pay | Admitting: Psychiatry

## 2023-03-02 DIAGNOSIS — F4310 Post-traumatic stress disorder, unspecified: Secondary | ICD-10-CM | POA: Diagnosis not present

## 2023-03-02 DIAGNOSIS — F6089 Other specific personality disorders: Secondary | ICD-10-CM | POA: Diagnosis not present

## 2023-03-02 DIAGNOSIS — F319 Bipolar disorder, unspecified: Secondary | ICD-10-CM | POA: Diagnosis not present

## 2023-03-02 DIAGNOSIS — F419 Anxiety disorder, unspecified: Secondary | ICD-10-CM

## 2023-03-02 MED ORDER — LITHIUM CARBONATE ER 450 MG PO TBCR: EXTENDED_RELEASE_TABLET | ORAL | 2 refills | Status: DC

## 2023-03-02 MED ORDER — CLONIDINE HCL 0.2 MG PO TABS: 0.2000 mg | ORAL_TABLET | Freq: Every day | ORAL | 0 refills | Status: DC

## 2023-03-02 MED ORDER — VORTIOXETINE HBR 20 MG PO TABS: 20.0000 mg | ORAL_TABLET | Freq: Every day | ORAL | 0 refills | Status: DC

## 2023-03-02 MED ORDER — FLUOXETINE HCL 20 MG PO CAPS: 20.0000 mg | ORAL_CAPSULE | Freq: Every day | ORAL | 0 refills | Status: DC

## 2023-03-02 NOTE — Progress Notes (Addendum)
 [facility] MD Virtual Progress Note   Patient Location: [facility] Provider Location: Home Office  I connect with patient by video and verified that I am speaking with correct person by using two identifiers. I discussed the limitations of evaluation and management by telemedicine and the availability of in person appointments. I also discussed with the patient that there may be a patient responsible charge related to this service. The patient expressed understanding and agreed to proceed.  Susan [facility] [id number] 53 y.o.  03/02/2023 10:41 AM  History of Present Illness:  Patient is evaluated by video session.  She is at [facility].  She reported things are going very well and stable on medication.  She really liked the Trintellix  but sometimes she had difficulty getting the prescription as pharmacies backorder.  Patient admitted those days when she does not have meds noticed irritability, depression, mood swings.  He patient told her living situation is the same.  She continues to provide care to her husband who is not doing very well. She reported therapy with Allana Arabia had helped her a lot.  She is sleeping better.  She had lost almost 30 pounds in the past few months since she started [facility] and also watching her calorie intake.  She also reported that she was very sick to her stomach and that also contributed to some weight loss.  She denies any nightmares or flashback patient has any mania, psychosis.  Her nightmares and flashbacks are not as bad.  She denies any major panic attack.  We ordered labs.  Labs are normal.  Lithium  level therapeutic.  Past Psychiatric History: H/O Bipolar, anxiety and borderline traits. On meds since age 89.  H/O inpatient, suicidal attempt, overdose, jump from the car and banging head.  Tried Lamictal (rash), Seroquel, Abilify, doxepin, Trazodone, Vraylar , olanzapine  and hydroxyzine. H/O n/c with meds, refusing to eat and needed PICC line. H/O anger, road  rage, paranoia and trust issues. Had DBT with good response. Genesight test shows Pristiq, Fetzima and Viibryd, lithium , tegretol and Valproic in better column.     Outpatient Encounter Medications as of 03/02/2023  Medication Sig   albuterol (VENTOLIN HFA) 108 (90 Base) MCG/ACT inhaler SMARTSIG:2 Puff(s) By Mouth 4 Times Daily PRN   amoxicillin -clavulanate (AUGMENTIN ) 875-125 MG tablet Take 1 tablet by mouth every 12 (twelve) hours.   calcium carbonate (OS-CAL - DOSED IN MG OF ELEMENTAL CALCIUM) 1250 (500 Ca) MG tablet Take 1 tablet by mouth daily with breakfast.    cephALEXin  (KEFLEX ) 500 MG capsule Take 1 capsule (500 mg total) by mouth 2 (two) times daily.   Cholecalciferol 25 MCG (1000 UT) capsule Take 1,000 Units by mouth daily.    cloNIDine  (CATAPRES ) 0.2 MG tablet Take 1 tablet (0.2 mg total) by mouth at bedtime.   Ferrous Sulfate (SLOW FE) 142 (45 Fe) MG TBCR 1 tablet   FLUoxetine  (PROZAC ) 20 MG capsule Take 1 capsule (20 mg total) by mouth daily.   fluticasone  (FLONASE ) 50 MCG/ACT nasal spray 2 sprays   lithium  carbonate (ESKALITH ) 450 MG ER tablet Take one tab twice a day.   solifenacin (VESICARE) 5 MG tablet Take 5 mg by mouth daily.   Trimethoprim  HCl (TRIMPEX  PO) Take by mouth.   vortioxetine  HBr (TRINTELLIX ) 20 MG TABS tablet Take 1 tablet (20 mg total) by mouth daily.   No facility-administered encounter medications on file as of 03/02/2023.    Recent Results (from the past 2160 hours)  CBC with Differential  Status: None   Collection Time: 01/05/23  1:58 PM  Result Value Ref Range   WBC 6.3 3.4 - 10.8 x10E3/uL   RBC 4.54 3.77 - 5.28 x10E6/uL   Hemoglobin 13.3 11.1 - 15.9 g/dL   Hematocrit 40.9 81.1 - 46.6 %   MCV 89 79 - 97 fL   MCH 29.3 26.6 - 33.0 pg   MCHC 32.8 31.5 - 35.7 g/dL   RDW 91.4 78.2 - 95.6 %   Platelets 261 150 - 450 x10E3/uL   Neutrophils 63 Not Estab. %   Lymphs 27 Not Estab. %   Monocytes 7 Not Estab. %   Eos 1 Not Estab. %   Basos 2 Not Estab.  %   Neutrophils Absolute 3.9 1.4 - 7.0 x10E3/uL   Lymphocytes Absolute 1.7 0.7 - 3.1 x10E3/uL   Monocytes Absolute 0.4 0.1 - 0.9 x10E3/uL   EOS (ABSOLUTE) 0.1 0.0 - 0.4 x10E3/uL   Basophils Absolute 0.1 0.0 - 0.2 x10E3/uL   Immature Granulocytes 0 Not Estab. %   Immature Grans (Abs) 0.0 0.0 - 0.1 x10E3/uL  CMP14+EGFR     Status: Abnormal   Collection Time: 01/05/23  1:58 PM  Result Value Ref Range   Glucose 68 (L) 70 - 99 mg/dL   BUN 8 6 - 24 mg/dL   Creatinine, Ser 2.13 0.57 - 1.00 mg/dL   eGFR 69 >08 MV/HQI/6.96   BUN/Creatinine Ratio 8 (L) 9 - 23   Sodium 139 134 - 144 mmol/L   Potassium 4.5 3.5 - 5.2 mmol/L   Chloride 103 96 - 106 mmol/L   CO2 22 20 - 29 mmol/L   Calcium 10.0 8.7 - 10.2 mg/dL   Total Protein 6.1 6.0 - 8.5 g/dL   Albumin 4.1 3.8 - 4.9 g/dL   Globulin, Total 2.0 1.5 - 4.5 g/dL   Bilirubin Total 0.5 0.0 - 1.2 mg/dL   Alkaline Phosphatase 64 44 - 121 IU/L   AST 18 0 - 40 IU/L   ALT 15 0 - 32 IU/L  Lithium  level     Status: None   Collection Time: 01/05/23  1:58 PM  Result Value Ref Range   Lithium  Lvl 1.0 0.5 - 1.2 mmol/L    Comment: A concentration of 0.5-0.8 mmol/L is advised for long-term use; concentrations of up to 1.2 mmol/L may be necessary during acute treatment.                                  Detection Limit = 0.1                           <0.1 indicates None Detected   HgB A1c     Status: None   Collection Time: 01/05/23  1:58 PM  Result Value Ref Range   Hgb A1c MFr Bld 5.0 4.8 - 5.6 %    Comment:          Prediabetes: 5.7 - 6.4          Diabetes: >6.4          Glycemic control for adults with diabetes: <7.0    Est. average glucose Bld gHb Est-mCnc 97 mg/dL     Psychiatric Specialty Exam: Physical Exam  Review of Systems  Weight 260 lb (117.9 kg).There is no height or weight on file to calculate BMI.  General Appearance: Casual  Eye Contact:  Fair  Speech:  Normal Rate  Volume:  Normal  Mood:  Euthymic  Affect:  Congruent   Thought Process:  Goal Directed  Orientation:  Full (Time, Place, and Person)  Thought Content:  Logical  Suicidal Thoughts:  No  Homicidal Thoughts:  No  Memory:  Immediate;   Good Recent;   Fair Remote;   Fair  Judgement:  Intact  Insight:  Present  Psychomotor Activity:  Normal  Concentration:  Concentration: Fair and Attention Span: Good  Recall:  Good  Fund of Knowledge:  Good  Language:  Good  Akathisia:  No  Handed:  Right  AIMS (if indicated):     Assets:  Communication Skills Desire for Improvement Financial Resources/Insurance Housing Transportation  ADL's:  Intact  Cognition:  WNL  Sleep:  ok     Assessment/Plan: Anxiety - Plan: vortioxetine  HBr (TRINTELLIX ) 20 MG TABS tablet, FLUoxetine  (PROZAC ) 20 MG capsule, cloNIDine  (CATAPRES ) 0.2 MG tablet  Bipolar I disorder (HCC) - Plan: vortioxetine  HBr (TRINTELLIX ) 20 MG TABS tablet, lithium  carbonate (ESKALITH ) 450 MG ER tablet, FLUoxetine  (PROZAC ) 20 MG capsule, cloNIDine  (CATAPRES ) 0.2 MG tablet  Cluster B personality disorder (HCC) - Plan: lithium  carbonate (ESKALITH ) 450 MG ER tablet, cloNIDine  (CATAPRES ) 0.2 MG tablet  PTSD (post-traumatic stress disorder) - Plan: FLUoxetine  (PROZAC ) 20 MG capsule  I discussed blood work results.  Labs are normal with normal therapeutic level of lithium .  She has no immediate concern.  Patient does not want to change the medication but like to see if she can resolve the issue of Trintellix  that sometimes backorder.  I recommend she can come to our office to get some samples and while waiting for the new refills and avoid missing the dose.  She agreed with the plan.  Encouraged to continue therapy with Fayne Hoover for her coping skills.  Continue lithium  450 mg twice a day, Trintellix  20 mg daily, clonidine  0.2 mg at bedtime, Prozac  20 mg daily.  Recommended to call us  back if she is any question or any concern.  Follow-up if 3 months   Follow Up Instructions:     I discussed the  assessment and treatment plan with the patient. The patient was provided an opportunity to ask questions and all were answered. The patient agreed with the plan and demonstrated an understanding of the instructions.   The patient was advised to call back or seek an in-person evaluation if the symptoms worsen or if the condition fails to improve as anticipated.    Collaboration of Care: Other provider involved in patient's care AEB notes are available in epic to review  Patient/Guardian was advised Release of Information must be obtained prior to any record release in order to collaborate their care with an outside provider. Patient/Guardian was advised if they have not already done so to contact the registration department to sign all necessary forms in order for us  to release information regarding their care.   Consent: Patient/Guardian gives verbal consent for treatment and assignment of benefits for services provided during this visit. Patient/Guardian expressed understanding and agreed to proceed.     I provided 26 minutes of non face to face time during this encounter.  Note: This document was prepared by [employer] voice dictation technology and any errors that results from this process are unintentional.    Arturo Late, MD 03/02/2023

## 2023-03-08 ENCOUNTER — Other Ambulatory Visit (HOSPITAL_COMMUNITY): Payer: Self-pay | Admitting: Psychiatry

## 2023-03-08 DIAGNOSIS — F419 Anxiety disorder, unspecified: Secondary | ICD-10-CM

## 2023-03-08 DIAGNOSIS — F319 Bipolar disorder, unspecified: Secondary | ICD-10-CM

## 2023-03-08 DIAGNOSIS — F4310 Post-traumatic stress disorder, unspecified: Secondary | ICD-10-CM

## 2023-03-13 ENCOUNTER — Ambulatory Visit (INDEPENDENT_AMBULATORY_CARE_PROVIDER_SITE_OTHER): Payer: 59 | Admitting: Psychiatry

## 2023-03-13 DIAGNOSIS — F319 Bipolar disorder, unspecified: Secondary | ICD-10-CM

## 2023-03-13 NOTE — Progress Notes (Signed)
 Virtual Visit via Video Note  I connected with Susan Ball on 03/13/23 at 8:09 AM EST by a video enabled telemedicine application and verified that I am speaking with the correct person using two identifiers.  Location: Patient: Home Provider: [facility]   I discussed the limitations of evaluation and management by telemedicine and the availability of in person appointments. The patient expressed understanding and agreed to proceed.   I provided 51 minutes of non-face-to-face time during this encounter.   Susan Salvage, LCSW  THERAPIST PROGRESS NOTE       Session Time: Tuesday 03/13/2023 8:09 AM -  9:00 AM   Participation Level: Active  Behavioral Response: CasualAlert/  Type of Therapy: Individual Therapy  Treatment Goals addressed: Susan Ball will reduce the amount of anger related incident/outburst by 50% for 4 consecutive weeks/Susan Ball will identify situations, thoughts, and feelings that trigger internal anger, angry verbal and or aggressive behavioral actions as evidenced by self recorded report  Progress on Goals: not progressing   Interventions: CBT and Supportive  Summary: Susan Ball is a 53 y.o. female who  is referred for services by Susan Ball. She just completed IOP on March 27, 2019. She has long standing history of bipolar disorder and borderline personality disorder. She has had 3 psychiatric hospitalizations. The last was in 2010 at [facility] due to suicide attempt. She participated in outpatient therapy intermittently at [facility] for 3 years. She last was seen there 6 months ago.  Current symptoms include depressed mood, crying spells, mood swings, aggression, anger, excessive worry, sleep difficulty, poor concentration, and memory difficulty.  Patient was seen via virtual visit about 2-3 weeks ago. Pt reports continued irritability and lashing out since last session. She reports having anger outbursts with her husband and reports a  recent episode of verbally aggressive behavior with another customer at a store. She continues to express anger and frustration regarding husband not doing activities within his capability. She reports feeling guilty about going to the gym or doing things away from home. She expresses fear she will be stuck and her life will remain the way it is now. Pt reports continued medication compliance. She reports continue enjoying attending church.   Suicidal/Homicidal:  No, without intent or plan.  Patient agrees to call 911, [phone], I have someone take her to the ED should symptoms worsen.  Therapist Response:  reviewed symptoms, discussed stressors, facilitated pt expressing thoughts and feelings, validated feelings, assisted pt examine should/must/ought thoughts and replace with wish/prefer, assisted pt identify realistic expectations of husband as well as self, assisted pt identify ways to set/maintain limits while also being supportive, worked with pt to identify ways to take a pause before responding, assisted pt identify/ challenge/and replace thoughts evoking inappropriate guilt with more rational statements, encouraged medication compliance, reviewed TIPP and developed plan with pt to practice,   plan: Return again in 2 weeks.  Diagnosis: Axis I: Bipolar Disorder    Axis II: Borderline Personality Dis.  Collaboration of Care: Psychiatrist AEB patient seeing psychiatrist Susan [facility], clinician reviewing chart  Patient/Guardian was advised Release of Information must be obtained prior to any record release in order to collaborate their care with an outside provider. Patient/Guardian was advised if they have not already done so to contact the registration department to sign all necessary forms in order for [name] to release information regarding their care.   Consent: Patient/Guardian gives verbal consent for treatment and assignment of benefits for services provided during this visit.  Patient/Guardian  expressed understanding and agreed to proceed.   Susan Salvage, LCSW 03/13/2023

## 2023-03-17 DIAGNOSIS — H5213 Myopia, bilateral: Secondary | ICD-10-CM | POA: Diagnosis not present

## 2023-03-27 ENCOUNTER — Ambulatory Visit (INDEPENDENT_AMBULATORY_CARE_PROVIDER_SITE_OTHER): Payer: 59 | Admitting: Psychiatry

## 2023-03-27 DIAGNOSIS — F319 Bipolar disorder, unspecified: Secondary | ICD-10-CM

## 2023-03-27 DIAGNOSIS — F6089 Other specific personality disorders: Secondary | ICD-10-CM | POA: Diagnosis not present

## 2023-03-27 NOTE — Progress Notes (Signed)
 Virtual Visit via Video Note  I connected with Susan Ball on 03/27/23 at 8:09 AM EST by a video enabled telemedicine application and verified that I am speaking with the correct person using two identifiers.  Location: Patient: Home Provider: [facility] [place] office   I discussed the limitations of evaluation and management by telemedicine and the availability of in person appointments. The patient expressed understanding and agreed to proceed.   I provided 50 minutes of non-face-to-face time during this encounter.   [name], LCSW  THERAPIST PROGRESS NOTE       Session Time: Tuesday 03/27/2023 8:05 AM -  8:55 AM   Participation Level: Active  Behavioral Response: CasualAlert/  Type of Therapy: Individual Therapy  Treatment Goals addressed: Susan Ball will reduce the amount of anger related incident/outburst by 50% for 4 consecutive weeks/Susan Ball will identify situations, thoughts, and feelings that trigger internal anger, angry verbal and or aggressive behavioral actions as evidenced by self recorded report  Progress on Goals: progressing   Interventions: CBT and Supportive  Summary: Susan Ball is a 53 y.o. female who  is referred for services by Susan [facility]. She just completed IOP on March 27, 2019. She has long standing history of bipolar disorder and borderline personality disorder. She has had 3 psychiatric hospitalizations. The last was in 2010 at [facility] due to suicide attempt. She participated in outpatient therapy intermittently at [facility] for 3 years. She last was seen there 6 months ago.  Current symptoms include depressed mood, crying spells, mood swings, aggression, anger, excessive worry, sleep difficulty, poor concentration, and memory difficulty.  Patient was seen via virtual visit about 2-3 weeks ago. Pt reports increased efforts to schedule time for self and has been enjoying time at the gym without feeling as much guilt. She also has stopped  rushing home. She reports positive effects as she no longer goes home and isolates.  She still expresses frustration with husband as his window of recovery from stroke is closing  per her report. She expresses disappointment he has not taken advantages of services provided. She reports having assertive communication with husband and trying to encourage him when he does make efforts. Pt reports starting to feel as though she is entering into a manic phase and is experiencing increased energy, excessive talking, sleep difficulty, and flirtatious behavior. She admits she recently missed taking her medication for about 2-3 days.     Suicidal/Homicidal:  No, without intent or plan.  Patient agrees to call 911, [phone], I have someone take her to the ED should symptoms worsen.  Therapist Response:  reviewed symptoms, discussed stressors, facilitated pt expressing thoughts and feelings, validated feelings, praised and reinforced pt's efforts to improve self-care and schedule time for self, discussed effects, praised and reinforced pt's efforts to improve assertiveness skills and discussed effects, assisted pt review should/must/ought thoughts and replace with wish/prefer, assisted pt identify realistic expectations of husband as well as self, assisted pt identify strategies to use in coping with manic behavior including taking a pause, being aware of triggering situations, discussed consequences of behavior, also reiterated role of medication compliance.   plan: Return again in 2 weeks.  Diagnosis: Axis I: Bipolar Disorder    Axis II: Borderline Personality Dis.  Collaboration of Care: Psychiatrist AEB patient seeing psychiatrist Dr. Lolly Mustache, clinician reviewing chart  Patient/Guardian was advised Release of Information must be obtained prior to any record release in order to collaborate their care with an outside provider. Patient/Guardian was advised if  they have not already done so to contact the registration  department to sign all necessary forms in order for [name] to release information regarding their care.   Consent: Patient/Guardian gives verbal consent for treatment and assignment of benefits for services provided during this visit. Patient/Guardian expressed understanding and agreed to proceed.   [name], LCSW 03/27/2023

## 2023-04-10 ENCOUNTER — Ambulatory Visit (HOSPITAL_COMMUNITY): Payer: 59 | Admitting: Psychiatry

## 2023-04-10 DIAGNOSIS — F6089 Other specific personality disorders: Secondary | ICD-10-CM | POA: Diagnosis not present

## 2023-04-10 DIAGNOSIS — F319 Bipolar disorder, unspecified: Secondary | ICD-10-CM | POA: Diagnosis not present

## 2023-04-10 NOTE — Progress Notes (Unsigned)
 Virtual Visit via Video Note  I connected with Susan Ball on 04/10/23 at 8:07 AM EDT  by a video enabled telemedicine application and verified that I am speaking with the correct person using two identifiers.  Location: Patient: Home Provider: [facility] [place] office    I discussed the limitations of evaluation and management by telemedicine and the availability of in person appointments. The patient expressed understanding and agreed to proceed.  I provided 52 minutes of non-face-to-face time during this encounter.   Susan Salvage, LCSW THERAPIST PROGRESS NOTE       Session Time: Tuesday 04/10/2023 8:06 AM - 8:58 AM  Participation Level: Active  Behavioral Response: CasualAlert/  Type of Therapy: Individual Therapy  Treatment Goals addressed: [name] will reduce the amount of anger related incident/outburst by 50% for 4 consecutive weeks/Susan Ball will identify situations, thoughts, and feelings that trigger internal anger, angry verbal and or aggressive behavioral actions as evidenced by self recorded report  Progress on Goals: progressing   Interventions: CBT and Supportive  Summary: Susan Ball is a 53 y.o. female who  is referred for services by [name]. She just completed IOP on March 27, 2019. She has long standing history of bipolar disorder and borderline personality disorder. She has had 3 psychiatric hospitalizations. The last was in 2010 at [facility] due to suicide attempt. She participated in outpatient therapy intermittently at [facility] for 3 years. She last was seen there 6 months ago.  Current symptoms include depressed mood, crying spells, mood swings, aggression, anger, excessive worry, sleep difficulty, poor concentration, and memory difficulty.  Patient was seen via virtual visit about 2-3 weeks ago. Pt states things have been about the same since last session.  She continues to report feeling as though she is experiencing a manic/hypomanic  episode as she is experienced decreased need for sleep and is exhibiting flirtatious behavior.  She also reports irritability but reports more restraint in managing emotional outbursts.  She implemented strategies discussed last session and reports she has been using self talk trying to take a pause before responding to triggering situations.  However, she reports having more verbally aggressive behavior with husband triggered by disappointment regarding her efforts to try to engage him in activities.  Patient reports she has resumed medication.   Suicidal/Homicidal:  No, without intent or plan.  Patient agrees to call 911, [phone], I have someone take her to the ED should symptoms worsen.  Therapist Response:  reviewed symptoms, discussed stressors, facilitated pt expressing thoughts and feelings, validated feelings, praised and reinforced pt's efforts to reduce emotional/anger outbursts, praised and reinforced patient's efforts to use strategies to cope with manic behavior including taking a pause, being aware of triggering situations, discussed consequences of behavior, reviewed treatment plan, sent signature page and treatment plan review to patient via MyChart, began to discuss next steps for treatment and ways to be more specific regarding goals, developed plan with patient to complete therapy goals handout in preparation for next session, will send patient handout via mail plan: Return again in 2 weeks.  Diagnosis: Axis I: Bipolar Disorder    Axis II: Borderline Personality Dis.  Collaboration of Care: Psychiatrist AEB patient seeing psychiatrist Dr. Lolly Mustache, clinician reviewing chart  Patient/Guardian was advised Release of Information must be obtained prior to any record release in order to collaborate their care with an outside provider. Patient/Guardian was advised if they have not already done so to contact the registration department to sign all necessary forms in order  for [name] to release  information regarding their care.   Consent: Patient/Guardian gives verbal consent for treatment and assignment of benefits for services provided during this visit. Patient/Guardian expressed understanding and agreed to proceed.   Susan Salvage, LCSW 04/10/2023

## 2023-04-24 ENCOUNTER — Ambulatory Visit (INDEPENDENT_AMBULATORY_CARE_PROVIDER_SITE_OTHER): Payer: 59 | Admitting: Psychiatry

## 2023-04-24 DIAGNOSIS — F319 Bipolar disorder, unspecified: Secondary | ICD-10-CM

## 2023-04-24 NOTE — Progress Notes (Signed)
 Virtual Visit via Video Note  I connected with Susan Ball on 04/24/23 at 8:09 AM EDT  by a video enabled telemedicine application and verified that I am speaking with the correct person using two identifiers.  Location: Patient: Home Provider: [facility] [place] [facility]    I discussed the limitations of evaluation and management by telemedicine and the availability of in person appointments. The patient expressed understanding and agreed to proceed.   I provided 48 minutes of non-face-to-face time during this encounter.   Susan Salvage, LCSW THERAPIST PROGRESS NOTE       Session Time: Tuesday 04/24/2023 8:09 AM - 8:57 AM   Participation Level: Active  Behavioral Response: CasualAlert/  Type of Therapy: Individual Therapy  Treatment Goals addressed: Susan Ball will reduce the amount of anger related incident/outburst by 50% for 4 consecutive weeks/Susan Ball will identify situations, thoughts, and feelings that trigger internal anger, angry verbal and or aggressive behavioral actions as evidenced by self recorded report  Progress on Goals: progressing   Interventions: CBT and Supportive  Summary: Susan Ball is a 53 y.o. female who  is referred for services by Susan Ball. She just completed IOP on March 27, 2019. She has long standing history of bipolar disorder and borderline personality disorder. She has had 3 psychiatric hospitalizations. The last was in 2010 at [facility] due to [facility] attempt. She participated in outpatient therapy intermittently at [facility] for 3 years. She last was seen there 6 months ago.  Current symptoms include depressed mood, crying spells, mood swings, aggression, anger, excessive worry, sleep difficulty, poor concentration, and memory difficulty.  Patient was seen via virtual visit about 2-3 weeks ago. Pt reports continuing to experience symptoms of a manic episode including excessive spending, speeding, until Sunday.  Patient reports she started  coming down yesterday and states starting to feel depressed.  She denies any suicidal ideations.  She reached out to a friend yesterday and tried to stay involved in activity including attending the gym.  Patient reports some issues with medication compliance as she reports mixing up her clonidine with her daytime medication rather than taking it at night but says that has been resolved.  She also reports sometimes forgetting to take medication in the mornings as she becomes distracted.  Patient reports having increased anger outbursts mainly with her husband in the past week and a half.  Per patient's report, this was triggered by husband giving false information to his medical provider that may have given an inaccurate perception of patient's care for her husband and his needs.  This triggered anger and fear for  Patient reports later confronting her husband who told her he did what he did as a joke.  Patient reports now reverting to all patterns of behavior with husband and having difficulty setting and maintaining limits.  Patient reports she has not picked up previously mailed handouts from her old address but plans to do so this weekend.  Suicidal/Homicidal:  No, without intent or plan.  Patient agrees to call 911, [phone], I have someone take her to the ED should symptoms worsen.  Therapist Response:  reviewed symptoms, discussed stressors, facilitated pt expressing thoughts and feelings, validated feelings, praised and reinforced pt's efforts to reach out to her friend for support, assisted patient examine her thought patterns as well as her pattern of interaction with her husband regarding recent incidents and anger outbursts, reviewed ways to improve distress tolerance including TIPP, also discussed practicing deep breathing 5 to 10 minutes twice  per day as relaxation technique to manage stress and anxiety, began to discuss mindfulness to help patient take a pause rather than react, will send patient handout  on mindfulness and the window of tolerance via mail in preparation for next session, also developed plan with patient to complete therapy goals handout in preparation for next session , discussed the importance of medication compliance  plan: Return again in 2 weeks.  Diagnosis: Axis I: Bipolar Disorder    Axis II: Borderline Personality Dis.  Collaboration of Care: Psychiatrist AEB patient seeing psychiatrist Dr. Lolly Ball, clinician reviewing chart  Patient/Guardian was advised Release of Information must be obtained prior to any record release in order to collaborate their care with an outside provider. Patient/Guardian was advised if they have not already done so to contact the registration department to sign all necessary forms in order for [name] to release information regarding their care.   Consent: Patient/Guardian gives verbal consent for treatment and assignment of benefits for services provided during this visit. Patient/Guardian expressed understanding and agreed to proceed.   Susan Salvage, LCSW 04/24/2023

## 2023-05-08 ENCOUNTER — Ambulatory Visit (INDEPENDENT_AMBULATORY_CARE_PROVIDER_SITE_OTHER): Payer: 59 | Admitting: Psychiatry

## 2023-05-08 DIAGNOSIS — F319 Bipolar disorder, unspecified: Secondary | ICD-10-CM

## 2023-05-08 DIAGNOSIS — F603 Borderline personality disorder: Secondary | ICD-10-CM | POA: Diagnosis not present

## 2023-05-08 NOTE — Progress Notes (Signed)
 Virtual Visit via Video Note  I connected with Susan Ball on 05/08/23 at 8:09 AM EDT  by a video enabled telemedicine application and verified that I am speaking with the correct person using two identifiers.  Location: Patient: Home Provider: [facility] [place] office    I discussed the limitations of evaluation and management by telemedicine and the availability of in person appointments. The patient expressed understanding and agreed to proceed.   I provided 50 minutes of non-face-to-face time during this encounter.   [name], LCSW THERAPIST PROGRESS NOTE       Session Time: Tuesday 05/08/2023 8:09 AM - 8:59  AM   Participation Level: Active  Behavioral Response: CasualAlert/  Type of Therapy: Individual Therapy  Treatment Goals addressed: [facility] will reduce the amount of anger related incident/outburst by 50% for 4 consecutive weeks/Rashena will identify situations, thoughts, and feelings that trigger internal anger, angry verbal and or aggressive behavioral actions as evidenced by self recorded report  Progress on Goals: progressing   Interventions: CBT and Supportive  Summary: Susan Ball is a 53 y.o. female who  is referred for services by [name]. She just completed IOP on March 27, 2019. She has long standing history of bipolar disorder and borderline personality disorder. She has had 3 psychiatric hospitalizations. The last was in 2010 at [facility] due to suicide attempt. She participated in outpatient therapy intermittently at [facility] for 3 years. She last was seen there 6 months ago.  Current symptoms include depressed mood, crying spells, mood swings, aggression, anger, excessive worry, sleep difficulty, poor concentration, and memory difficulty.  Patient was seen via virtual visit about 2-3 weeks ago. Pt reports experiencing symptoms of a depressive episode including passive suicidal ideations but denies any plan or intent to harm/kill self.   Patient initially has difficulty identifying triggers.  She eventually identifies looking at lab results of husband's recent blood work and learning information there may be some problems with his liver and kidney functioning.  Patient and husband are scheduled to meet with his doctor to discuss results.  Patient reports catastrophizing and having thoughts of increased caretaker responsibilities.  Patient states just wanting to escape and feeling overwhelmed.  She reports little to no activity beyond going to the gym 2 hours/day 4 days a week and working 2 days a week.  Beyond that time, she states just mainly sitting on the couch and ruminating.  She reports poor motivation.  Patient reports she has been medication compliant and relocated her medication to ensure compliance.    Suicidal/Homicidal:  No, without intent or plan.  Patient agrees to call 911, [phone], or have someone take her to the ED should symptoms worsen.  Therapist Response:  reviewed symptoms, discussed stressors, facilitated pt expressing thoughts and feelings, validated feelings, assisted patient identify triggers of increased depressed mood, assisted patient examine her thought patterns and response to husband's recent blood work, assisted patient identify/challenge/and replace catastrophizing thoughts with more rational thoughts, assisted patient examine the connection between her thoughts/feelings/behavior, discussed distress tolerance, reviewed ways to improve distress tolerance skills with the use DBT skill ACCEPTS, developed plan with patient to review previously sent handout, will send patient another handout if she cannot locate, praised and  reinforced patient's efforts to ensure medication compliance, developed plan with patient to resume use of daily planning to increase behavioral activation   plan: Return again in 2 weeks.  Diagnosis: Axis I: Bipolar Disorder    Axis II: Borderline Personality Dis.  Collaboration of Care:  Psychiatrist AEB patient seeing psychiatrist Dr. Carlos Chesterfield, clinician reviewing chart  Patient/Guardian was advised Release of Information must be obtained prior to any record release in order to collaborate their care with an outside provider. Patient/Guardian was advised if they have not already done so to contact the registration department to sign all necessary forms in order for us  to release information regarding their care.   Consent: Patient/Guardian gives verbal consent for treatment and assignment of benefits for services provided during this visit. Patient/Guardian expressed understanding and agreed to proceed.   Dicie Foster, LCSW 05/08/2023

## 2023-05-14 ENCOUNTER — Other Ambulatory Visit (HOSPITAL_COMMUNITY): Payer: Self-pay | Admitting: Psychiatry

## 2023-05-14 DIAGNOSIS — F319 Bipolar disorder, unspecified: Secondary | ICD-10-CM

## 2023-05-14 DIAGNOSIS — F419 Anxiety disorder, unspecified: Secondary | ICD-10-CM

## 2023-05-22 ENCOUNTER — Ambulatory Visit (INDEPENDENT_AMBULATORY_CARE_PROVIDER_SITE_OTHER): Payer: 59 | Admitting: Psychiatry

## 2023-05-22 DIAGNOSIS — F319 Bipolar disorder, unspecified: Secondary | ICD-10-CM | POA: Diagnosis not present

## 2023-05-22 DIAGNOSIS — F6089 Other specific personality disorders: Secondary | ICD-10-CM | POA: Diagnosis not present

## 2023-05-22 NOTE — Progress Notes (Signed)
 Virtual Visit via Video Note  I connected with Susan Ball on 05/22/23 at 8:11 AM EDT  by a video enabled telemedicine application and verified that I am speaking with the correct person using two identifiers.  Location: Patient: Home Provider: [facility] [place]    I discussed the limitations of evaluation and management by telemedicine and the availability of in person appointments. The patient expressed understanding and agreed to proceed.   I provided 50  minutes of non-face-to-face time during this encounter.   Dicie Foster, LCSW THERAPIST PROGRESS NOTE       Session Time: Tuesday 05/22/2023 8:10 AM - 9:00 AM   Participation Level: Active  Behavioral Response: CasualAlert/  Type of Therapy: Individual Therapy  Treatment Goals addressed: Vanice will reduce the amount of anger related incident/outburst by 50% for 4 consecutive weeks/Fonnie will identify situations, thoughts, and feelings that trigger internal anger, angry verbal and or aggressive behavioral actions as evidenced by self recorded report  Progress on Goals: progressing   Interventions: CBT and Supportive  Summary: Susan [facility] is a 53 y.o. female who  is referred for services by Molinda Angelica. She just completed IOP on March 27, 2019. She has long standing history of bipolar disorder and borderline personality disorder. She has had 3 psychiatric hospitalizations. The last was in 2010 at [facility] due to suicide attempt. She participated in outpatient therapy intermittently at [facility] for 3 years. She last was seen there 6 months ago.  Current symptoms include depressed mood, crying spells, mood swings, aggression, anger, excessive worry, sleep difficulty, poor concentration, and memory difficulty.  Patient was seen via virtual visit about 2-3 weeks ago.  Patient reports decreased anger outbursts and decreased arguing with husband.  pt reports continued stress and marital issues since last  session but managing better.  She continues to express frustration regarding her husband's behavior but also expresses increased acceptance she has no ability to to change it.  She expresses sadness regarding the change in the relationship but reports no longer spiraling downward experiencing passive suicidal ideations.  She reports this is difficult but is working on this day by day.  She reports now feeling less responsible for his behavior.  She is focusing more on improving self-care and participating in pleasurable activities with positive support.  She cites a recent example of enjoying a recreational event with her friends from the gym.  Patient continues to attend church and work.  Patient also reports improved use of mindfulness skills and responding wisely regarding a recent situation at a restaurant where she normally would have gotten angry and had an anger outburst.  Patient reports being able to take a pause and use self talk to manage.  Suicidal/Homicidal:  No, without intent or plan.  Patient agrees to call 911, [phone], or have someone take her to the ED should symptoms worsen.  Therapist Response:  reviewed symptoms, praised and reinforced patient's increased awareness/her efforts to use helpful distress tolerance skills/efforts to increase involvement and positive pleasurable activities, discussed effects, discussed benefits of increased awareness and responding versus reacting, reviewed activities to improve distress tolerance skills using ACCEPTS, developed plan with patient to complete the ACCEPTS handout and use strategies discussed in session. plan: Return again in 2 weeks.  Diagnosis: Axis I: Bipolar Disorder    Axis II: Borderline Personality Dis.  Collaboration of Care: Psychiatrist AEB patient seeing psychiatrist Dr. Carlos Chesterfield, clinician reviewing chart  Patient/Guardian was advised Release of Information must be obtained prior to  any record release in order to collaborate their care  with an outside provider. Patient/Guardian was advised if they have not already done so to contact the registration department to sign all necessary forms in order for us  to release information regarding their care.   Consent: Patient/Guardian gives verbal consent for treatment and assignment of benefits for services provided during this visit. Patient/Guardian expressed understanding and agreed to proceed.   Dicie Foster, LCSW 05/22/2023

## 2023-05-30 ENCOUNTER — Encounter (HOSPITAL_COMMUNITY): Payer: Self-pay

## 2023-05-30 ENCOUNTER — Telehealth (HOSPITAL_BASED_OUTPATIENT_CLINIC_OR_DEPARTMENT_OTHER): Payer: 59 | Admitting: Psychiatry

## 2023-05-30 DIAGNOSIS — Z91199 Patient's noncompliance with other medical treatment and regimen due to unspecified reason: Secondary | ICD-10-CM

## 2023-05-30 NOTE — Progress Notes (Signed)
 Patient is no-show on video platform.  Tried to call on her cell phone and left a message to reschedule appointment.

## 2023-06-05 ENCOUNTER — Ambulatory Visit (INDEPENDENT_AMBULATORY_CARE_PROVIDER_SITE_OTHER): Admitting: Psychiatry

## 2023-06-05 ENCOUNTER — Telehealth (HOSPITAL_COMMUNITY): Payer: Self-pay | Admitting: Psychiatry

## 2023-06-05 DIAGNOSIS — F319 Bipolar disorder, unspecified: Secondary | ICD-10-CM | POA: Diagnosis not present

## 2023-06-05 NOTE — Progress Notes (Signed)
 Virtual Visit via Video Note  I connected with Susan Ball on 06/05/23 at 8:17 AM EDT  by a video enabled telemedicine application and verified that I am speaking with the correct person using two identifiers.  Location: Patient: Home Provider: [facility] [place] office    I discussed the limitations of evaluation and management by telemedicine and the availability of in person appointments. The patient expressed understanding and agreed to proceed.   I provided  43 minutes of non-face-to-face time during this encounter.   Dicie Foster, LCSW THERAPIST PROGRESS NOTE       Session Time: Tuesday 06/05/2023 8:17 AM - 9:00 AM  Participation Level: Active  Behavioral Response: CasualAlert/  Type of Therapy: Individual Therapy  Treatment Goals addressed: Susan Ball will reduce the amount of anger related incident/outburst by 50% for 4 consecutive weeks/Susan Ball will identify situations, thoughts, and feelings that trigger internal anger, angry verbal and or aggressive behavioral actions as evidenced by self recorded report  Progress on Goals: progressing   Interventions: CBT and Supportive  Summary: Susan Ball is a 53 y.o. female who  is referred for services by Molinda Angelica. She just completed IOP on March 27, 2019. She has long standing history of bipolar disorder and borderline personality disorder. She has had 3 psychiatric hospitalizations. The last was in 2010 at [facility] due to suicide attempt. She participated in outpatient therapy intermittently at [facility] for 3 years. She last was seen there 6 months ago.  Current symptoms include depressed mood, crying spells, mood swings, aggression, anger, excessive worry, sleep difficulty, poor concentration, and memory difficulty.  Patient was seen via virtual visit about 2-3 weeks ago.  Patient reports continued decreased anger outbursts and decreased arguing with husband.  She reports continuing to improve managing marital  stress.  She expresses continued frustration regarding her husband's behaviors but increased acceptance.  She cites a recent example.  Patient reports having more realistic expectations of husband.  She is scheduling more time for self.  She has increased behavioral activation and socialization.  She reports enjoying celebrating Mother's Day with her sisters and other relatives as well as her son.  She also is pleased with her recent 25 pound weight loss and states feeling better about self.  She also continues to rely on her spirituality.   Suicidal/Homicidal:  No, without intent or plan.  Patient agrees to call 911, [phone], or have someone take her to the ED should symptoms worsen.  Therapist Response:  reviewed symptoms, praised and reinforced increased awareness/scheduling time for self, and developing more realistic expectations, discussed effects on mood/thoughts/behavior, assisted patient discussed ways to maintain consistent efforts, developed plan with patient to continue monitoring mood plan: Return again in 2 weeks.  Diagnosis: Axis I: Bipolar Disorder    Axis II: Borderline Personality Dis.  Collaboration of Care: Psychiatrist AEB patient seeing psychiatrist Dr. Carlos Chesterfield, clinician reviewing chart  Patient/Guardian was advised Release of Information must be obtained prior to any record release in order to collaborate their care with an outside provider. Patient/Guardian was advised if they have not already done so to contact the registration department to sign all necessary forms in order for us  to release information regarding their care.   Consent: Patient/Guardian gives verbal consent for treatment and assignment of benefits for services provided during this visit. Patient/Guardian expressed understanding and agreed to proceed.   Dicie Foster, LCSW 06/05/2023

## 2023-06-11 ENCOUNTER — Telehealth (HOSPITAL_COMMUNITY): Admitting: Psychiatry

## 2023-06-11 ENCOUNTER — Encounter (HOSPITAL_COMMUNITY): Payer: Self-pay | Admitting: Psychiatry

## 2023-06-11 DIAGNOSIS — F4310 Post-traumatic stress disorder, unspecified: Secondary | ICD-10-CM

## 2023-06-11 DIAGNOSIS — F319 Bipolar disorder, unspecified: Secondary | ICD-10-CM | POA: Diagnosis not present

## 2023-06-11 DIAGNOSIS — F6089 Other specific personality disorders: Secondary | ICD-10-CM | POA: Diagnosis not present

## 2023-06-11 DIAGNOSIS — F419 Anxiety disorder, unspecified: Secondary | ICD-10-CM | POA: Diagnosis not present

## 2023-06-11 MED ORDER — FLUOXETINE HCL 20 MG PO CAPS: 20.0000 mg | ORAL_CAPSULE | Freq: Every day | ORAL | 0 refills | Status: DC

## 2023-06-11 MED ORDER — LITHIUM CARBONATE ER 450 MG PO TBCR: EXTENDED_RELEASE_TABLET | ORAL | 2 refills | Status: DC

## 2023-06-11 MED ORDER — VORTIOXETINE HBR 20 MG PO TABS: 20.0000 mg | ORAL_TABLET | Freq: Every day | ORAL | 0 refills | Status: DC

## 2023-06-11 MED ORDER — CLONIDINE HCL 0.2 MG PO TABS: 0.2000 mg | ORAL_TABLET | Freq: Every day | ORAL | 0 refills | Status: DC

## 2023-06-11 NOTE — Progress Notes (Signed)
 [facility] MD Virtual Progress Note   Patient Location: In Car Provider Location: Home Office  I connect with patient by video and verified that I am speaking with correct person by using two identifiers. I discussed the limitations of evaluation and management by telemedicine and the availability of in person appointments. I also discussed with the patient that there may be a patient responsible charge related to this service. The patient expressed understanding and agreed to proceed.  Susan Ball [id number] 53 y.o.  06/11/2023 1:16 PM  History of Present Illness:  Patient is evaluated by video session.  She reported chronic anxiety dysphoria because husband not able to do much and his family does not come and help the patient.  She also reported some time missed taking the Trintellix  and that causes worsening of anxiety and depression.  She feels the current medicine is working however she need to take it regularly.  She is sleeping at least 6 hours.  She started DBT with Fayne Hoover.  Patient told that is helping her a lot.  She does not get as agitated irritable angry and denies any crying spells or any feeling of hopelessness or suicidal thoughts.  She continues to watch her calorie intake and trying to keep her weight stable.  She is very active at [facility].  She had lost more than 35 pounds in past 6 months.  Her nightmares and flashbacks are not as bad.  Patient told her husband requires a lot of help after the stroke and hoping if anyone from his family can come to help.  Sometime patient ask her mother to come when she has to do things because she does not want her husband to be alone for longer period of time. Patient denies drinking or using any illegal substances.  She has no tremors, shakes or any EPS.  She likely combination of Trintellix , lithium , Prozac  and clonidine .  She denies any mania or any highs and lows.  She denies any panic attack.  She is not involved in  any self abusive behavior.  Past Psychiatric History: H/O Bipolar, anxiety and borderline traits. On meds since age 36.  H/O inpatient, suicidal attempt, overdose, jump from the car and banging head.  Tried Lamictal (rash), Seroquel, Abilify, doxepin, Trazodone, Vraylar , olanzapine  and hydroxyzine. H/O n/c with meds, refusing to eat and needed PICC line. H/O anger, road rage, paranoia and trust issues. Had DBT with good response. Genesight test shows Pristiq, Fetzima and Viibryd, lithium , tegretol and Valproic in better column.     Outpatient Encounter Medications as of 06/11/2023  Medication Sig   albuterol (VENTOLIN HFA) 108 (90 Base) MCG/ACT inhaler SMARTSIG:2 Puff(s) By Mouth 4 Times Daily PRN   amoxicillin -clavulanate (AUGMENTIN ) 875-125 MG tablet Take 1 tablet by mouth every 12 (twelve) hours.   calcium carbonate (OS-CAL - DOSED IN MG OF ELEMENTAL CALCIUM) 1250 (500 Ca) MG tablet Take 1 tablet by mouth daily with breakfast.    cephALEXin  (KEFLEX ) 500 MG capsule Take 1 capsule (500 mg total) by mouth 2 (two) times daily.   Cholecalciferol 25 MCG (1000 UT) capsule Take 1,000 Units by mouth daily.    cloNIDine  (CATAPRES ) 0.2 MG tablet Take 1 tablet (0.2 mg total) by mouth at bedtime.   Ferrous Sulfate (SLOW FE) 142 (45 Fe) MG TBCR 1 tablet   FLUoxetine  (PROZAC ) 20 MG capsule Take 1 capsule (20 mg total) by mouth daily.   fluticasone  (FLONASE ) 50 MCG/ACT nasal spray 2 sprays   lithium  carbonate (  ESKALITH ) 450 MG ER tablet Take one tab twice a day.   solifenacin (VESICARE) 5 MG tablet Take 5 mg by mouth daily.   Trimethoprim  HCl (TRIMPEX  PO) Take by mouth.   vortioxetine  HBr (TRINTELLIX ) 20 MG TABS tablet Take 1 tablet (20 mg total) by mouth daily.   No facility-administered encounter medications on file as of 06/11/2023.    No results found for this or any previous visit (from the past 2160 hours).   Psychiatric Specialty Exam: Physical Exam  Review of Systems  Weight 220 lb (99.8  kg).There is no height or weight on file to calculate BMI.  General Appearance: Casual  Eye Contact:  Fair  Speech:  Slow  Volume:  Normal  Mood:  Dysphoric  Affect:  Congruent  Thought Process:  Goal Directed  Orientation:  Full (Time, Place, and Person)  Thought Content:  Rumination  Suicidal Thoughts:  No  Homicidal Thoughts:  No  Memory:  Immediate;   Good Recent;   Fair Remote;   Fair  Judgement:  Intact  Insight:  Present  Psychomotor Activity:  Decreased  Concentration:  Concentration: Fair and Attention Span: Fair  Recall:  Good  Fund of Knowledge:  Good  Language:  Good  Akathisia:  No  Handed:  Right  AIMS (if indicated):     Assets:  Communication Skills Desire for Improvement Housing Transportation  ADL's:  Intact  Cognition:  WNL  Sleep:  4-5 hrs       06/11/2023    1:32 PM 12/29/2022   10:13 AM 10/24/2022    9:16 AM 10/21/2021    9:18 AM 09/24/2020   11:21 AM  Depression screen PHQ 2/9  Decreased Interest 1 3 3 3 1   Down, Depressed, Hopeless 1 3 3 3 1   PHQ - 2 Score 2 6 6 6 2   Altered sleeping 2 3 2 2  0  Tired, decreased energy 1 2 2 1 3   Change in appetite 0 3 3 3 3   Feeling bad or failure about yourself  0 1 0 1 3  Trouble concentrating 0 2 3 3 3   Moving slowly or fidgety/restless 0 0 0 1 2  Suicidal thoughts 0 2 2 1 1   PHQ-9 Score 5 19 18 18 17   Difficult doing work/chores Not difficult at all    Extremely dIfficult    Assessment/Plan: Anxiety - Plan: cloNIDine  (CATAPRES ) 0.2 MG tablet, FLUoxetine  (PROZAC ) 20 MG capsule, vortioxetine  HBr (TRINTELLIX ) 20 MG TABS tablet  Bipolar I disorder (HCC) - Plan: cloNIDine  (CATAPRES ) 0.2 MG tablet, FLUoxetine  (PROZAC ) 20 MG capsule, lithium  carbonate (ESKALITH ) 450 MG ER tablet, vortioxetine  HBr (TRINTELLIX ) 20 MG TABS tablet  Cluster B personality disorder (HCC) - Plan: cloNIDine  (CATAPRES ) 0.2 MG tablet, lithium  carbonate (ESKALITH ) 450 MG ER tablet  PTSD (post-traumatic stress disorder) - Plan:  FLUoxetine  (PROZAC ) 20 MG capsule  Patient stable on current medication however she tends to forget sometimes taking Trintellix .  Encourage medication compliance since it is important to keep her symptoms stable.  Discussed to take melatonin as needed for insomnia.  Encouraged to keep appointment with Fayne Hoover.  She has no major concerns on the medication.  Continue Trintellix  20 mg daily, clonidine  0.2 mg at bedtime, Prozac  20 mg daily and lithium  450 mg twice a day.  She had a lithium  level 1.0 done in December 2024.  Since doing DBT techniques she reported very helpful to controlling her symptoms.  Recommend to call us  back if she is any question  or any concern.  Follow-up in 3 months.   Follow Up Instructions:     I discussed the assessment and treatment plan with the patient. The patient was provided an opportunity to ask questions and all were answered. The patient agreed with the plan and demonstrated an understanding of the instructions.   The patient was advised to call back or seek an in-person evaluation if the symptoms worsen or if the condition fails to improve as anticipated.    Collaboration of Care: Other provider involved in patient's care AEB notes are available in epic to review  Patient/Guardian was advised Release of Information must be obtained prior to any record release in order to collaborate their care with an outside provider. Patient/Guardian was advised if they have not already done so to contact the registration department to sign all necessary forms in order for us  to release information regarding their care.   Consent: Patient/Guardian gives verbal consent for treatment and assignment of benefits for services provided during this visit. Patient/Guardian expressed understanding and agreed to proceed.     Total encounter time 27 minutes which includes face-to-face time, chart reviewed, care coordination, order entry and documentation during this encounter.    Note: This document was prepared by [employer] voice dictation technology and any errors that results from this process are unintentional.    Arturo Late, MD 06/11/2023

## 2023-06-12 ENCOUNTER — Encounter: Payer: Self-pay | Admitting: Emergency Medicine

## 2023-06-12 ENCOUNTER — Other Ambulatory Visit: Payer: Self-pay

## 2023-06-12 ENCOUNTER — Ambulatory Visit: Admission: EM | Admit: 2023-06-12 | Discharge: 2023-06-12 | Disposition: A | Discharge status: Home / Self Care

## 2023-06-12 DIAGNOSIS — B9689 Other specified bacterial agents as the cause of diseases classified elsewhere: Secondary | ICD-10-CM | POA: Diagnosis not present

## 2023-06-12 DIAGNOSIS — R42 Dizziness and giddiness: Secondary | ICD-10-CM | POA: Diagnosis not present

## 2023-06-12 DIAGNOSIS — J019 Acute sinusitis, unspecified: Secondary | ICD-10-CM | POA: Diagnosis not present

## 2023-06-12 LAB — POCT FASTING CBG KUC MANUAL ENTRY: POCT Glucose (KUC): 77 mg/dL (ref 70–99)

## 2023-06-12 MED ORDER — MECLIZINE HCL 12.5 MG PO TABS: 12.5000 mg | ORAL_TABLET | Freq: Three times a day (TID) | ORAL | 0 refills | Status: DC | PRN

## 2023-06-12 MED ORDER — AMOXICILLIN-POT CLAVULANATE 875-125 MG PO TABS: 1.0000 | ORAL_TABLET | Freq: Two times a day (BID) | ORAL | 0 refills | Status: AC

## 2023-06-12 NOTE — ED Provider Notes (Addendum)
 [facility]    CSN: [id number] Arrival date & time: 06/12/23  1333      History   Chief Complaint No chief complaint on file.   HPI Susan Ball is a 53 y.o. female.   Patient presents today with dizziness that began this morning when she woke up.  Reports symptoms worse with rolling over in bed, bending over, or moving her head.  She describes a room spinning sensation that makes her feel nauseous and gives her a headache.  She has also been dealing with some sinus tenderness, sinus headache, and congestion for the past week.  Denies fever, body aches or chills, significant cough, abdominal pain, vomiting, or change in appetite over the past week.  No recent head injury or trauma, photophobia, unsteady gait, postural stability, double vision, trouble swallowing, trouble with speech, weakness, pallor, diaphoresis, chest pain, or shortness of breath since symptoms began.     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      Home Medications    Prior to Admission medications    Medication Sig Start Date End Date Taking? Authorizing Provider  amoxicillin -clavulanate (AUGMENTIN ) 875-125 MG tablet Take 1 tablet by mouth 2 (two) times daily for 7 days. 06/12/23 06/19/23 Yes Wilhemena Harbour, NP  meclizine (ANTIVERT) 12.5 MG tablet Take 1 tablet (12.5 mg total) by mouth 3 (three) times daily as needed for dizziness. Do not take with alcohol or while driving or operating heavy machinery.  May cause drowsiness. 06/12/23  Yes Wilhemena Harbour, NP  nitrofurantoin, macrocrystal-monohydrate, (MACROBID) 100 MG capsule Take 100 mg by mouth daily.   Yes [provider]  albuterol (VENTOLIN HFA) 108 (90 Base) MCG/ACT inhaler SMARTSIG:2 Puff(s) By Mouth 4 Times Daily PRN 11/03/20   [provider]  calcium carbonate (OS-CAL - DOSED IN MG OF ELEMENTAL CALCIUM) 1250 (500 Ca) MG tablet Take 1 tablet by mouth daily with breakfast.     [provider]  Cholecalciferol 25 MCG (1000 UT) capsule Take 1,000 Units by mouth daily.     [provider]  cloNIDine  (CATAPRES ) 0.2 MG tablet Take 1 tablet (0.2 mg total) by mouth at bedtime. 06/11/23 09/09/23  [name], Bronson Canny, MD  Ferrous Sulfate (SLOW FE) 142 (45 Fe) MG TBCR 1 tablet 11/03/20   [provider]  FLUoxetine  (PROZAC ) 20 MG capsule Take 1 capsule (20 mg total) by mouth daily. 06/11/23 09/09/23  Arturo Late, MD  fluticasone  (FLONASE ) 50 MCG/ACT nasal spray 2 sprays 05/23/22   Afton Albright, MD  lithium  carbonate (ESKALITH ) 450 MG ER tablet Take  one tab twice a day. 06/11/23   [name], Bronson Canny, MD  solifenacin (VESICARE) 5 MG tablet Take 5 mg by mouth daily. 09/21/20   [provider]  Trimethoprim  HCl (TRIMPEX  PO) Take by mouth.    [provider]  vortioxetine  HBr (TRINTELLIX ) 20 MG TABS tablet Take 1 tablet (20 mg total) by mouth daily. 06/11/23   [name], Bronson Canny, MD    Family History Family History  Problem Relation Age of Onset   Depression Sister    Depression Brother      Social History Social History   Tobacco Use   Smoking status: Never   Smokeless tobacco: Never  Vaping Use   Vaping status: Never Used  Substance Use Topics   Alcohol use: Not Currently   Drug use: Not Currently    Types: Marijuana    Comment: States she stopped smoking THC in 2002     Allergies   Fexofenadine and Lamictal [lamotrigine]   Review of Systems Review of Systems Per HPI  Physical Exam Triage Vital Signs ED Triage Vitals  Encounter Vitals Group     BP 06/12/23 1351 130/84     Systolic BP Percentile --      Diastolic BP Percentile --      Pulse Rate 06/12/23 1351 68     Resp 06/12/23 1351 18     Temp 06/12/23 1351 98.7 F (37.1 C)     Temp Source 06/12/23 1351 Oral     SpO2 06/12/23 1351 97 %     Weight --      Height --      Head Circumference --      Peak Flow --      Pain Score 06/12/23 1353 5     Pain Loc --      Pain Education --      Exclude from Growth Chart --    Orthostatic VS for the past 24 hrs:  BP- Lying Pulse- Lying BP- Sitting Pulse- Sitting BP- Standing at 0 minutes Pulse- Standing at 0 minutes  06/12/23 1402 134/84 67 132/88 67 129/85 75    Updated Vital Signs BP 130/84 (BP Location: Right Arm)   Pulse 68   Temp 98.7 F (37.1 C) (Oral)   Resp 18   SpO2 97%   Visual Acuity Right Eye Distance:   Left Eye Distance:   Bilateral Distance:    Right Eye Near:   Left Eye Near:    Bilateral Near:     Physical Exam Vitals and nursing note reviewed.  Constitutional:      General: She is not in acute distress.    Appearance: Normal appearance. She is not toxic-appearing.  HENT:     Head: Normocephalic and atraumatic.     Right Ear: Tympanic membrane, ear canal and external ear normal. There is no impacted cerumen.     Left Ear: Tympanic membrane, ear canal and external ear normal. There is no impacted cerumen.     Nose: No congestion or rhinorrhea.     Right Sinus: Maxillary sinus tenderness and frontal sinus  tenderness present.     Mouth/Throat:     Mouth: Mucous membranes are moist.     Pharynx: Oropharynx is clear. No posterior oropharyngeal erythema.  Eyes:     General: No scleral icterus.    Extraocular Movements: Extraocular movements intact.     Right eye: Normal extraocular motion.     Left eye: Normal extraocular motion.     Pupils:  Pupils are equal, round, and reactive to light.  Cardiovascular:     Rate and Rhythm: Normal rate and regular rhythm.  Pulmonary:     Effort: Pulmonary effort is normal. No respiratory distress.     Breath sounds: Normal breath sounds. No wheezing, rhonchi or rales.  Musculoskeletal:     Cervical back: Normal range of motion.  Lymphadenopathy:     Cervical: No cervical adenopathy.  Skin:    General: Skin is warm and dry.     Capillary Refill: Capillary refill takes less than 2 seconds.     Coloration: Skin is not jaundiced or pale.     Findings: No erythema.  Neurological:     General: No focal deficit present.     Mental Status: She is alert and oriented to person, place, and time.     Cranial Nerves: Cranial nerves 2-12 are intact.     Sensory: Sensation is intact.     Motor: Motor function is intact.     Coordination: Coordination is intact. Romberg sign negative. Heel to [facility] Test normal. Rapid alternating movements normal.     Gait: Gait is intact. Gait normal.  Psychiatric:        Behavior: Behavior is cooperative.      UC Treatments / Results  Labs (all labs ordered are listed, but only abnormal results are displayed) Labs Reviewed  POCT FASTING CBG [facility] MANUAL ENTRY - Normal    EKG   Radiology No results found.  Procedures Procedures (including critical care time)  Medications Ordered in UC Medications - No data to display  Initial Impression / Assessment and Plan / UC Course  I have reviewed the triage vital signs and the nursing notes.  Pertinent labs & imaging results that were available during my care of the  patient were reviewed by me and considered in my medical decision making (see chart for details).   Patient is well-appearing, normotensive, afebrile, not tachycardic, not tachypneic, oxygenating well on room air.   1. Acute bacterial sinusitis Treat with Augmentin  twice daily for 7 days Other supportive care discussed including guaifenesin, saline lavages Return and ER precautions discussed  2. Vertigo Suspect secondary to sinus infection, see above EKG today shows normal sinus rhythm with ventricular rate of 65 bpm; no ST segment or T wave changes when compared with previous EKG a couple of years ago Blood sugar is not low and orthostatic vital signs are negative for orthostasis Can use meclizine every 8 hours as needed if vertigo persist despite treatment for sinus infection Strict ER precautions discussed with patient  The patient was given the opportunity to ask questions.  All questions answered to their satisfaction.  The patient is in agreement to this plan.   Final Clinical Impressions(s) / UC Diagnoses   Final diagnoses:  Acute bacterial sinusitis  Vertigo   Discharge Instructions      EKG, vital signs, and blood sugar today look great.  The dizziness you are describing is likely vertigo and should get better as a sinus infection improves.  You can take the meclizine every 8 hours as needed to help with the vertigo; this will make you sleepy so do not drive or operate any heavy machinery after taking the medication.  To treat the sinus infection, start taking Augmentin  twice daily for 7 days.  In addition, recommend nasal saline lavages and guaifenesin 600 mg twice daily.  Seek care if symptoms do not improve with treatment.  ED Prescriptions  Medication Sig Dispense Auth. Provider   amoxicillin -clavulanate (AUGMENTIN ) 875-125 MG tablet Take 1 tablet by mouth 2 (two) times daily for 7 days. 14 tablet Thena Fireman A, NP   meclizine (ANTIVERT) 12.5 MG tablet Take 1  tablet (12.5 mg total) by mouth 3 (three) times daily as needed for dizziness. Do not take with alcohol or while driving or operating heavy machinery.  May cause drowsiness. 30 tablet Wilhemena Harbour, NP      PDMP not reviewed this encounter.   Wilhemena Harbour, NP 06/12/23 1649    Wilhemena Harbour, NP 06/12/23 1650

## 2023-06-12 NOTE — ED Triage Notes (Signed)
 Dizziness started this morning.  Headache and sinus drainage over the past week.

## 2023-06-12 NOTE — Discharge Instructions (Signed)
 EKG, vital signs, and blood sugar today look great.  The dizziness you are describing is likely vertigo and should get better as a sinus infection improves.  You can take the meclizine every 8 hours as needed to help with the vertigo; this will make you sleepy so do not drive or operate any heavy machinery after taking the medication.  To treat the sinus infection, start taking Augmentin  twice daily for 7 days.  In addition, recommend nasal saline lavages and guaifenesin 600 mg twice daily.  Seek care if symptoms do not improve with treatment.

## 2023-06-14 ENCOUNTER — Ambulatory Visit (HOSPITAL_COMMUNITY): Admitting: Psychiatry

## 2023-06-15 ENCOUNTER — Ambulatory Visit (HOSPITAL_COMMUNITY): Admitting: Psychiatry

## 2023-06-15 DIAGNOSIS — F319 Bipolar disorder, unspecified: Secondary | ICD-10-CM

## 2023-06-15 DIAGNOSIS — F603 Borderline personality disorder: Secondary | ICD-10-CM | POA: Diagnosis not present

## 2023-06-15 NOTE — Progress Notes (Signed)
 Virtual Visit via Video Note  I connected with Susan Ball on 06/15/23 at 8:10AM EDT  by a video enabled telemedicine application and verified that I am speaking with the correct person using two identifiers.  Location: Patient: Home Provider: Home office    I discussed the limitations of evaluation and management by telemedicine and the availability of in person appointments. The patient expressed understanding and agreed to proceed.   I provided  40   minutes of non-face-to-face time during this encounter.   Dicie Foster, LCSW THERAPIST PROGRESS NOTE       Session Time: Tuesday 06/05/2023 8:17 AM - 8:57AM  Participation Level: Active  Behavioral Response: CasualAlert/  Type of Therapy: Individual Therapy  Treatment Goals addressed: Susan Ball will reduce the amount of anger related incident/outburst by 50% for 4 consecutive weeks/Susan Ball will identify situations, thoughts, and feelings that trigger internal anger, angry verbal and or aggressive behavioral actions as evidenced by self recorded report  Progress on Goals: progressing   Interventions: CBT and Supportive  Summary: Susan Ball is a 53 y.o. female who  is referred for services by Molinda Angelica. She just completed IOP on March 27, 2019. She has long standing history of bipolar disorder and borderline personality disorder. She has had 3 psychiatric hospitalizations. The last was in 2010 at [facility] due to suicide attempt. She participated in outpatient therapy intermittently at [facility] for 3 years. She last was seen there 6 months ago.  Current symptoms include depressed mood, crying spells, mood swings, aggression, anger, excessive worry, sleep difficulty, poor concentration, and memory difficulty.  Patient was seen via virtual visit about 2-3 weeks ago.  She reports little to no change in symptoms since last session.  She continues to experience ongoing marital stress and caretaker responsibilities.  Per patient's  report, she continues to have emotional outburst with husband as she is frustrated regarding his behavior.  She reports outbursts have not being as intense.  She also reports she has been sick with recurring sinus infections.  This has resulted in vertigo as well as nausea.  Patient reports not feeling well for the past 2 weeks.  She also reports reduced involvement in activity due to not feeling well. Suicidal/Homicidal:  No, without intent or plan.  Patient agrees to call 911, [phone], or have someone take her to the ED should symptoms worsen.  Therapist Response:  reviewed symptoms, discussed stressors, facilitated expression of thoughts and feelings, validated feelings, assisted patient to behavioral analysis regarding one of her recent emotional outbursts with husband,    Assisted patient identify distress tolerance skills to use including radical acceptance, assisted patient identify ways to disengage before having outburst, develop plan with patient to use strategies discussed in session, encouraged patient to resume involvement in activity once feeling better  plan: Return again in 2 weeks.  Diagnosis: Axis I: Bipolar Disorder    Axis II: Borderline Personality Dis.  Collaboration of Care: Psychiatrist AEB patient seeing psychiatrist Dr. Carlos Chesterfield, clinician reviewing chart  Patient/Guardian was advised Release of Information must be obtained prior to any record release in order to collaborate their care with an outside provider. Patient/Guardian was advised if they have not already done so to contact the registration department to sign all necessary forms in order for us  to release information regarding their care.   Consent: Patient/Guardian gives verbal consent for treatment and assignment of benefits for services provided during this visit. Patient/Guardian expressed understanding and agreed to proceed.   Trenity Pha E  Danelle Dunning, LCSW 06/15/2023

## 2023-07-02 ENCOUNTER — Ambulatory Visit (INDEPENDENT_AMBULATORY_CARE_PROVIDER_SITE_OTHER): Admitting: Psychiatry

## 2023-07-02 DIAGNOSIS — F319 Bipolar disorder, unspecified: Secondary | ICD-10-CM
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
## 2023-07-22 ENCOUNTER — Ambulatory Visit
Admission: EM | Admit: 2023-07-22 | Discharge: 2023-07-22 | Disposition: A | Discharge status: Home / Self Care | Attending: Family Medicine | Admitting: Family Medicine

## 2023-07-22 ENCOUNTER — Other Ambulatory Visit: Payer: Self-pay

## 2023-07-22 ENCOUNTER — Encounter: Payer: Self-pay | Admitting: Emergency Medicine

## 2023-07-22 DIAGNOSIS — N390 Urinary tract infection, site not specified: Secondary | ICD-10-CM | POA: Diagnosis not present

## 2023-07-22 LAB — POCT URINALYSIS DIP (MANUAL ENTRY)
Bilirubin, UA: NEGATIVE
Glucose, UA: NEGATIVE mg/dL
Ketones, POC UA: NEGATIVE mg/dL
Nitrite, UA: POSITIVE — AB
Protein Ur, POC: 30 mg/dL — AB
Spec Grav, UA: 1.02 (ref 1.010–1.025)
Urobilinogen, UA: 2 U/dL — AB
pH, UA: 7 (ref 5.0–8.0)

## 2023-07-22 MED ORDER — CEPHALEXIN 500 MG PO CAPS: 500.0000 mg | ORAL_CAPSULE | Freq: Two times a day (BID) | ORAL | 0 refills | Status: DC

## 2023-07-22 NOTE — ED Triage Notes (Signed)
 Pt reports urinary frequency, urgency, odor x1 week. denies any abd pain, nausea, fever.

## 2023-07-22 NOTE — ED Provider Notes (Signed)
 [facility]    CSN: [id number] Arrival date & time: 07/22/23  1049      History   Chief Complaint Chief Complaint  Patient presents with   Urinary Frequency    HPI Susan Ball is a 53 y.o. female.   Patient presenting today with 1 week history of urinary frequency, urgency, urine odor.  Denies fever, chills, flank pain, hematuria, abdominal pain, nausea, vomiting.  So far not trying anything over-the-counter for symptoms.  History of recurrent UTIs, following up in the next few weeks with her urologist.    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      Home Medications    Prior to Admission medications   Medication Sig Start Date End Date Taking? Authorizing Provider  cephALEXin  (KEFLEX ) 500 MG capsule Take 1 capsule (500 mg total) by mouth 2 (two) times daily. 07/22/23  Yes Stuart Vernell Norris, PA-C  albuterol (VENTOLIN HFA) 108 (90 Base) MCG/ACT inhaler SMARTSIG:2 Puff(s) By Mouth 4 Times Daily PRN 11/03/20   [provider]  calcium carbonate (OS-CAL -  DOSED IN MG OF ELEMENTAL CALCIUM) 1250 (500 Ca) MG tablet Take 1 tablet by mouth daily with breakfast.     [provider]  Cholecalciferol 25 MCG (1000 UT) capsule Take 1,000 Units by mouth daily.     [provider]  cloNIDine  (CATAPRES ) 0.2 MG tablet Take 1 tablet (0.2 mg total) by mouth at bedtime. 06/11/23 09/09/23  [name], Leni DASEN, MD  Ferrous Sulfate (SLOW FE) 142 (45 Fe) MG TBCR 1 tablet 11/03/20   [provider]  FLUoxetine  (PROZAC ) 20 MG capsule Take 1 capsule (20 mg total) by mouth daily. 06/11/23 09/09/23  [name], Syed T, MD  fluticasone  (FLONASE ) 50 MCG/ACT nasal spray 2 sprays 05/23/22   Rolinda Rogue, MD  lithium  carbonate (ESKALITH ) 450 MG ER tablet Take one tab twice a day. 06/11/23   [name], Leni DASEN, MD  meclizine  (ANTIVERT ) 12.5 MG tablet Take 1 tablet (12.5 mg total) by mouth 3 (three) times daily as needed for dizziness. Do not take with alcohol or while driving or operating heavy machinery.  May cause drowsiness. 06/12/23   Chandra Harlene LABOR, NP  nitrofurantoin, macrocrystal-monohydrate, (MACROBID) 100 MG capsule Take 100 mg by mouth daily.    [provider]  solifenacin (VESICARE) 5 MG tablet Take 5 mg by mouth daily. 09/21/20   [provider]  Trimethoprim  HCl (TRIMPEX  PO) Take by mouth.    [provider]  vortioxetine  HBr (TRINTELLIX ) 20 MG TABS tablet Take 1 tablet (20 mg total) by mouth daily.  06/11/23   [name], Leni DASEN, MD    Family History Family History  Problem Relation Age of Onset   Depression Sister    Depression Brother     Social History Social History   Tobacco Use   Smoking status: Never   Smokeless tobacco: Never  Vaping Use   Vaping status: Never Used  Substance Use Topics   Alcohol use: Not Currently   Drug use: Not Currently    Types: Marijuana    Comment: States she stopped smoking THC in 2002     Allergies   Fexofenadine and Lamictal [lamotrigine]   Review of Systems Review of  Systems Per HPI  Physical Exam Triage Vital Signs ED Triage Vitals  Encounter Vitals Group     BP 07/22/23 1140 130/74     Girls Systolic BP Percentile --      Girls Diastolic BP Percentile --      Boys Systolic BP Percentile --      Boys Diastolic BP Percentile --      Pulse Rate 07/22/23 1140 60     Resp 07/22/23 1140 20     Temp 07/22/23 1140 98.9 F (37.2 C)     Temp Source 07/22/23 1140 Oral     SpO2 07/22/23 1140 98 %     Weight --      Height --      Head Circumference --      Peak Flow --      Pain Score 07/22/23 1139 6     Pain Loc --      Pain Education --      Exclude from Growth Chart --    No data found.  Updated Vital Signs BP 130/74 (BP Location: Right Arm)   Pulse 60   Temp 98.9 F (37.2 C) (Oral)   Resp 20   LMP 07/08/2023 (Approximate)   SpO2 98%   Visual Acuity Right Eye Distance:   Left Eye Distance:   Bilateral Distance:    Right Eye Near:   Left Eye Near:    Bilateral Near:     Physical Exam Vitals and nursing note reviewed.  Constitutional:      Appearance: Normal appearance. She is not ill-appearing.  HENT:     Head: Atraumatic.   Eyes:     Extraocular Movements: Extraocular movements intact.     Conjunctiva/sclera: Conjunctivae normal.    Cardiovascular:     Rate and Rhythm: Normal rate and regular rhythm.  Pulmonary:     Effort: Pulmonary effort is normal.  Abdominal:     General: Abdomen is flat. There is no distension.     Palpations: Abdomen is soft.     Tenderness: There is no abdominal tenderness. There is no right CVA tenderness, left CVA tenderness or guarding.   Musculoskeletal:        General: Normal range of motion.     Cervical back: Normal range of motion and neck supple.   Skin:    General: Skin is warm and dry.   Neurological:     Mental Status: She is alert and oriented to person, place, and time.   Psychiatric:        Mood and Affect: Mood normal.        Thought Content: Thought content normal.         Judgment: Judgment normal.      UC Treatments / Results  Labs (all labs ordered are listed, but only abnormal results are displayed) Labs  Reviewed  POCT URINALYSIS DIP (MANUAL ENTRY) - Abnormal; Notable for the following components:      Result Value   Blood, UA moderate (*)    Protein Ur, POC =30 (*)    Urobilinogen, UA 2.0 (*)    Nitrite, UA Positive (*)    Leukocytes, UA Small (1+) (*)    All other components within normal limits  URINE CULTURE    EKG   Radiology No results found.  Procedures Procedures (including critical care time)  Medications Ordered in UC Medications - No data to display  Initial Impression / Assessment and Plan / UC Course  I have reviewed the triage vital signs and the nursing notes.  Pertinent labs & imaging results that were available during my care of the patient were reviewed by me and considered in my medical decision making (see chart for details).     Urinalysis today with evidence of urinary tract infection.  Urine culture pending, will treat with Keflex  and await urine culture, adjusting if needed.  Follow-up with urologist as scheduled, return sooner for worsening symptoms.  Final Clinical Impressions(s) / UC Diagnoses   Final diagnoses:  Acute lower UTI   Discharge Instructions   None    ED Prescriptions     Medication Sig Dispense Auth. Provider   cephALEXin  (KEFLEX ) 500 MG capsule Take 1 capsule (500 mg total) by mouth 2 (two) times daily. 10 capsule Stuart Vernell Norris, [place]      PDMP not reviewed this encounter.   Stuart Vernell Norris, [place] 07/22/23 1511

## 2023-07-24 ENCOUNTER — Ambulatory Visit (HOSPITAL_COMMUNITY): Payer: Self-pay

## 2023-07-24 LAB — URINE CULTURE: Culture: >=100000 — AB

## 2023-07-30 ENCOUNTER — Ambulatory Visit (INDEPENDENT_AMBULATORY_CARE_PROVIDER_SITE_OTHER): Admitting: Psychiatry

## 2023-07-30 DIAGNOSIS — F319 Bipolar disorder, unspecified: Secondary | ICD-10-CM | POA: Diagnosis not present

## 2023-07-30 DIAGNOSIS — F603 Borderline personality disorder: Secondary | ICD-10-CM | POA: Diagnosis not present

## 2023-07-30 NOTE — Progress Notes (Signed)
 Virtual Visit via Video Note  I connected with Susan Ball on 07/30/23 at 8:07 AM EDT  by a video enabled telemedicine application and verified that I am speaking with the correct person using two identifiers.  Location: Patient: Home Provider: Home office    I discussed the limitations of evaluation and management by telemedicine and the availability of in person appointments. The patient expressed understanding and agreed to proceed.   I provided  44   minutes of non-face-to-face time during this encounter.   Winton FORBES Rubinstein, LCSW THERAPIST PROGRESS NOTE       Session Time: Monday  07/30/2023 8:07 AM - 8:51 AM  Participation Level: Active  Behavioral Response: CasualAlert/euthymic  Type of Therapy: Individual Therapy  Treatment Goals addressed: Kadey will reduce the amount of anger related incident/outburst by 50% for 4 consecutive weeks/Myka will identify situations, thoughts, and feelings that trigger internal anger, angry verbal and or aggressive behavioral actions as evidenced by self recorded report  Progress on Goals: progressing   Interventions: CBT and Supportive  Summary: Susan Ball is a 53 y.o. female who  is referred for services by Ricka Gaskins. She just completed IOP on March 27, 2019. She has long standing history of bipolar disorder and borderline personality disorder. She has had 3 psychiatric hospitalizations. The last was in 2010 at [facility] due to suicide attempt. She participated in outpatient therapy intermittently at [facility] for 3 years. She last was seen there 6 months ago.  Current symptoms include depressed mood, crying spells, mood swings, aggression, anger, excessive worry, sleep difficulty, poor concentration, and memory difficulty.  Patient was seen via virtual visit about 3-4  weeks ago.  She reports continued decreased marital stress as her husband has continued to engage actively in his recovery process.  He still is attending physical  therapy and trying to do exercises at home.  Patient reports having suicidal ideations about 3 weeks ago but was guarded regarding identifying a possible trigger.  She does admit she may have missed her medication for couple of days during that time.  She reports she talked with a friend who helped her to look at her situation differently.  She reports feeling better after about 2 to 3 days and resuming normal interest and involvement in activity.  She denies any current suicidal ideations.  She reports doing well now and is looking forward to going on a family trip to [place]  tomorrow for a week.  She expresses some anxiety about potential conflict between her sister and brother.  Suicidal/Homicidal:  No, without intent or plan.  Patient agrees to call 911, 988, or have someone take her to the ED should symptoms worsen.  Therapist Response:  reviewed symptoms, discussed stressors, facilitated expression of thoughts and feelings, validated feelings Praised and reinforced patient's use of her support system, assisted patient identify specific statements she used to help self cope with SI regarding reasons for living and  affects on her loved ones should she try to harm self, developed plan with patient to write this information to refer to in the future if and when needed, discussed next steps for treatment to include focus on DBT skills particularly mindfulness, developed plan with patient to keep daily mood chart, reiterated role of medication compliance, assisted patient identify ways to set limits regarding her involvement in dynamic between her sister and brother  plan: Return again in 2 weeks.  Diagnosis: Axis I: Bipolar Disorder    Axis II: Borderline Personality Dis.  Collaboration of Care: Psychiatrist AEB patient seeing psychiatrist Dr. Curry, clinician reviewing chart  Patient/Guardian was advised Release of Information must be obtained prior to any record release in order to collaborate  their care with an outside provider. Patient/Guardian was advised if they have not already done so to contact the registration department to sign all necessary forms in order for us  to release information regarding their care.   Consent: Patient/Guardian gives verbal consent for treatment and assignment of benefits for services provided during this visit. Patient/Guardian expressed understanding and agreed to proceed.   Winton FORBES Rubinstein, LCSW 07/30/2023

## 2023-08-13 ENCOUNTER — Ambulatory Visit (INDEPENDENT_AMBULATORY_CARE_PROVIDER_SITE_OTHER): Admitting: Psychiatry

## 2023-08-13 DIAGNOSIS — F319 Bipolar disorder, unspecified: Secondary | ICD-10-CM | POA: Diagnosis not present

## 2023-08-13 NOTE — Progress Notes (Signed)
 Virtual Visit via Video Note  I connected with Susan Ball on 08/13/23 at 8:13 AM EDT  by a video enabled telemedicine application and verified that I am speaking with the correct person using two identifiers.  Location: Patient: Home Provider: Home office    I discussed the limitations of evaluation and management by telemedicine and the availability of in person appointments. The patient expressed understanding and agreed to proceed.   I provided  50   minutes of non-face-to-face time during this encounter.   Winton FORBES Rubinstein, LCSW THERAPIST PROGRESS NOTE       Session Time: Monday  08/13/2023 8:14 AM - 9:04 AM  Participation Level: Active  Behavioral Response: CasualAlert/euthymic  Type of Therapy: Individual Therapy  Treatment Goals addressed: Marise will reduce the amount of anger related incident/outburst by 50% for 4 consecutive weeks/Lidiya will identify situations, thoughts, and feelings that trigger internal anger, angry verbal and or aggressive behavioral actions as evidenced by self recorded report  Progress on Goals: progressing   Interventions: CBT and Supportive  Summary: Susan Ball is a 53 y.o. female who  is referred for services by Ricka Gaskins. She just completed IOP on March 27, 2019. She has long standing history of bipolar disorder and borderline personality disorder. She has had 3 psychiatric hospitalizations. The last was in 2010 at [facility] due to suicide attempt. She participated in outpatient therapy intermittently at [facility] for 3 years. She last was seen there 6 months ago.  Current symptoms include depressed mood, crying spells, mood swings, aggression, anger, excessive worry, sleep difficulty, poor concentration, and memory difficulty.  Patient was seen via virtual visit about 2 weeks ago.  She reports  Positive stable mood since last session.  She enjoyed recent trip to [place]  with her family.  She reports being pleased with her husband's  active engagement and involvement while they were on their trip.  She also is pleased her brother and sister did not have any major conflict/confrontation while they were on the family trip.  She was aware of tension in their relationship but was able to avoid the dynamics of their relationship and remained neutral.  Patient denies having any major emotional outbursts since last session.  However, she has encountered situations where she normally would have had an emotional outburst but chose to pause and take a different course of action.  She cites a recent example of an incident when she was working out at [employer].  She still reports struggling trying to avoid road rage .  She also reports being argumentative with husband.    Suicidal/Homicidal:  No, without intent or plan.  Patient agrees to call 911, 988, or have someone take her to the ED should symptoms worsen.  Therapist Response:  reviewed symptoms, discussed stressors, facilitated expression of thoughts and feelings, validated feelings praised and reinforced patient's efforts to remain mutual regarding the relationship between her siblings, praised and reinforced patient's efforts to respond  in a more healthy way during recent incident at the [facility], assisted patient examine the incident along with her behavior to identify how she was able to pause before responding, discussed benefits of her choice to respond rather than react, discussed patient's thought patterns contributing to road rage, assisted patient examine her should and ought thoughts and the effects on her mood/behavior, discussed consequences, assisted patient identify alternative statements, developed plan with patient to use replacement statements, began to discuss mindfulness and the window of tolerance, will send patient handout in  preparation for next session.   plan: Return again in 2 weeks.  Diagnosis: Axis I: Bipolar Disorder    Axis II: Borderline Personality  Dis.  Collaboration of Care: Psychiatrist AEB patient seeing psychiatrist Dr. Curry, clinician reviewing chart  Patient/Guardian was advised Release of Information must be obtained prior to any record release in order to collaborate their care with an outside provider. Patient/Guardian was advised if they have not already done so to contact the registration department to sign all necessary forms in order for us  to release information regarding their care.   Consent: Patient/Guardian gives verbal consent for treatment and assignment of benefits for services provided during this visit. Patient/Guardian expressed understanding and agreed to proceed.   Winton FORBES Rubinstein, LCSW 08/13/2023

## 2023-08-27 ENCOUNTER — Ambulatory Visit (HOSPITAL_COMMUNITY): Admitting: Psychiatry

## 2023-09-03 ENCOUNTER — Encounter (HOSPITAL_COMMUNITY): Admitting: Psychiatry

## 2023-09-03 ENCOUNTER — Encounter (HOSPITAL_COMMUNITY): Payer: Self-pay

## 2023-09-03 NOTE — Progress Notes (Signed)
 Patient phone is not working.  She is unable to connect on video platform.  Tried to call her cell but going into voicemail.  Left a voicemail to call back.

## 2023-09-05 ENCOUNTER — Telehealth (HOSPITAL_COMMUNITY): Admitting: Psychiatry

## 2023-09-05 ENCOUNTER — Encounter (HOSPITAL_COMMUNITY): Payer: Self-pay | Admitting: Psychiatry

## 2023-09-05 VITALS — Wt 220.0 lb

## 2023-09-05 DIAGNOSIS — F6089 Other specific personality disorders: Secondary | ICD-10-CM

## 2023-09-05 DIAGNOSIS — F319 Bipolar disorder, unspecified: Secondary | ICD-10-CM

## 2023-09-05 DIAGNOSIS — F419 Anxiety disorder, unspecified: Secondary | ICD-10-CM

## 2023-09-05 DIAGNOSIS — F4310 Post-traumatic stress disorder, unspecified: Secondary | ICD-10-CM

## 2023-09-05 MED ORDER — LITHIUM CARBONATE ER 450 MG PO TBCR: EXTENDED_RELEASE_TABLET | ORAL | 2 refills | Status: DC

## 2023-09-05 MED ORDER — VORTIOXETINE HBR 20 MG PO TABS: 20.0000 mg | ORAL_TABLET | Freq: Every day | ORAL | 0 refills | Status: DC

## 2023-09-05 MED ORDER — CLONIDINE HCL 0.2 MG PO TABS: 0.2000 mg | ORAL_TABLET | Freq: Every day | ORAL | 0 refills | Status: DC

## 2023-09-05 MED ORDER — FLUOXETINE HCL 20 MG PO CAPS: 20.0000 mg | ORAL_CAPSULE | Freq: Every day | ORAL | 0 refills | Status: DC

## 2023-09-05 NOTE — Progress Notes (Signed)
 [facility] MD Virtual Progress Note   Patient Location: In Car Provider Location: Home Office  I connect with patient by video and verified that I am speaking with correct person by using two identifiers. I discussed the limitations of evaluation and management by telemedicine and the availability of in person appointments. I also discussed with the patient that there may be a patient responsible charge related to this service. The patient expressed understanding and agreed to proceed.  Susan Ball [id number] 53 y.o.  09/05/2023 8:58 AM  History of Present Illness:  Patient aggravated by video session.  She reported things are going better.  Her husband is also getting better and is starting physical therapy.  She is taking the medication as prescribed and since taking the medication every day it is helping her mood and anxiety.  She is getting therapy with Winton Rubinstein.  She has no tremors, shakes or any EPS.  Occasionally she has nightmares and flashback but do not feel worsening of symptoms.  Her appetite is okay.  Her weight is stable.  Recently seen in the urgent care for UTI and symptoms are resolved.  Patient denies any panic attack.  She denies any mania, psychosis, hallucination or any paranoia.  Past Psychiatric History: H/O Bipolar, anxiety and borderline traits. On meds since age 44.  H/O inpatient, suicidal attempt, overdose, jump from the car and banging head.  Tried Lamictal (rash), Seroquel, Abilify, doxepin, Trazodone, Vraylar , olanzapine  and hydroxyzine. H/O n/c with meds, refusing to eat and needed PICC line. H/O anger, road rage, paranoia and trust issues. Had DBT with good response. Genesight test shows Pristiq, Fetzima and Viibryd, lithium , tegretol and Valproic in better column.    Past Medical History:  Diagnosis Date   Anxiety    Bipolar disorder (HCC)    Depression    History of borderline personality disorder     Outpatient Encounter  Medications as of 09/05/2023  Medication Sig   albuterol (VENTOLIN HFA) 108 (90 Base) MCG/ACT inhaler SMARTSIG:2 Puff(s) By Mouth 4 Times Daily PRN   calcium carbonate (OS-CAL - DOSED IN MG OF ELEMENTAL CALCIUM) 1250 (500 Ca) MG tablet Take 1 tablet by mouth daily with breakfast.    cephALEXin  (KEFLEX ) 500 MG capsule Take 1 capsule (500 mg total) by mouth 2 (two) times daily.   Cholecalciferol 25 MCG (1000 UT) capsule Take 1,000 Units by mouth daily.    cloNIDine  (CATAPRES ) 0.2 MG tablet Take 1 tablet (0.2 mg total) by mouth at bedtime.   Ferrous Sulfate (SLOW FE) 142 (45 Fe) MG TBCR 1 tablet   FLUoxetine  (PROZAC ) 20 MG capsule Take 1 capsule (20 mg total) by mouth daily.   fluticasone  (FLONASE ) 50 MCG/ACT nasal spray 2 sprays   lithium  carbonate (ESKALITH ) 450 MG ER tablet Take one tab twice a day.   meclizine  (ANTIVERT ) 12.5 MG tablet Take 1 tablet (12.5 mg total) by mouth 3 (three) times daily as needed for dizziness. Do not take with alcohol or while driving or operating heavy machinery.  May cause drowsiness.   nitrofurantoin, macrocrystal-monohydrate, (MACROBID) 100 MG capsule Take 100 mg by mouth daily.   solifenacin (VESICARE) 5 MG tablet Take 5 mg by mouth daily.   Trimethoprim  HCl (TRIMPEX  PO) Take by mouth.   vortioxetine  HBr (TRINTELLIX ) 20 MG TABS tablet Take 1 tablet (20 mg total) by mouth daily.   No facility-administered encounter medications on file as of 09/05/2023.    Recent Results (from the past 2160 hours)  POCT  CBG (manual entry)     Status: Normal   Collection Time: 06/12/23  2:01 PM  Result Value Ref Range   POCT Glucose ([facility]) 77 70 - 99 mg/dL  POCT urinalysis dipstick     Status: Abnormal   Collection Time: 07/22/23 11:49 AM  Result Value Ref Range   Color, UA yellow yellow   Clarity, UA clear clear   Glucose, UA negative negative mg/dL   Bilirubin, UA negative negative   Ketones, POC UA negative negative mg/dL   Spec Grav, UA 8.979 8.989 - 1.025   Blood, UA  moderate (A) negative   pH, UA 7.0 5.0 - 8.0   Protein Ur, POC =30 (A) negative mg/dL   Urobilinogen, UA 2.0 (A) 0.2 or 1.0 E.U./dL   Nitrite, UA Positive (A) Negative   Leukocytes, UA Small (1+) (A) Negative  Urine Culture     Status: Abnormal   Collection Time: 07/22/23 12:09 PM   Specimen: Urine, Clean Catch  Result Value Ref Range   Specimen Description      URINE, CLEAN CATCH Performed at [facility], [address] [place], [place] [address]    Special Requests      NONE Performed at [facility], [address], [place], [place] [address]    Culture >=100,000 COLONIES/mL KLEBSIELLA PNEUMONIAE (A)    Report Status 07/24/2023 FINAL    Organism ID, Bacteria KLEBSIELLA PNEUMONIAE (A)       Susceptibility   Klebsiella pneumoniae - MIC*    AMPICILLIN >=32 RESISTANT Resistant     CEFAZOLIN <=4 SENSITIVE Sensitive     CEFEPIME <=0.12 SENSITIVE Sensitive     CEFTRIAXONE <=0.25 SENSITIVE Sensitive     CIPROFLOXACIN  <=0.25 SENSITIVE Sensitive     GENTAMICIN <=1 SENSITIVE Sensitive     IMIPENEM <=0.25 SENSITIVE Sensitive     NITROFURANTOIN 256 RESISTANT Resistant     TRIMETH /SULFA  <=20 SENSITIVE Sensitive     AMPICILLIN/SULBACTAM 8 SENSITIVE Sensitive     PIP/TAZO <=4 SENSITIVE Sensitive ug/mL    * >=100,000 COLONIES/mL KLEBSIELLA PNEUMONIAE     Psychiatric Specialty Exam: Physical Exam  Review of Systems  Weight 220 lb (99.8 kg).There is no height or weight on file to calculate BMI.  General Appearance: Casual and wearing mask  Eye Contact:  Fair  Speech:  Normal Rate  Volume:  Normal  Mood:  Euthymic  Affect:  Appropriate  Thought Process:  Goal Directed  Orientation:  Full (Time, Place, and Person)  Thought Content:  Rumination  Suicidal Thoughts:  No  Homicidal Thoughts:  No  Memory:  Immediate;   Good Recent;   Good Remote;   Fair  Judgement:  Intact  Insight:  Present  Psychomotor Activity:  Normal  Concentration:  Concentration: Fair and Attention Span:  Fair  Recall:  Good  Fund of Knowledge:  Good  Language:  Good  Akathisia:  No  Handed:  Right  AIMS (if indicated):     Assets:  Communication Skills Desire for Improvement Housing Social Support Transportation  ADL's:  Intact  Cognition:  WNL  Sleep:  fair       06/11/2023    1:32 PM 12/29/2022   10:13 AM 10/24/2022    9:16 AM 10/21/2021    9:18 AM 09/24/2020   11:21 AM  Depression screen PHQ 2/9  Decreased Interest 1 3 3 3 1   Down, Depressed, Hopeless 1 3 3 3 1   PHQ - 2 Score 2 6 6 6 2   Altered sleeping 2  3 2 2  0  Tired, decreased energy 1 2 2 1 3   Change in appetite 0 3 3 3 3   Feeling bad or failure about yourself  0 1 0 1 3  Trouble concentrating 0 2 3 3 3   Moving slowly or fidgety/restless 0 0 0 1 2  Suicidal thoughts 0 2 2 1 1   PHQ-9 Score 5 19 18 18 17   Difficult doing work/chores Not difficult at all    Extremely dIfficult    Assessment/Plan: Bipolar I disorder (HCC) - Plan: cloNIDine  (CATAPRES ) 0.2 MG tablet, FLUoxetine  (PROZAC ) 20 MG capsule, vortioxetine  HBr (TRINTELLIX ) 20 MG TABS tablet, lithium  carbonate (ESKALITH ) 450 MG ER tablet  Anxiety - Plan: cloNIDine  (CATAPRES ) 0.2 MG tablet, FLUoxetine  (PROZAC ) 20 MG capsule, vortioxetine  HBr (TRINTELLIX ) 20 MG TABS tablet  Cluster B personality disorder (HCC) - Plan: cloNIDine  (CATAPRES ) 0.2 MG tablet, lithium  carbonate (ESKALITH ) 450 MG ER tablet  PTSD (post-traumatic stress disorder) - Plan: FLUoxetine  (PROZAC ) 20 MG capsule  Patient is stable on current medication.  Since taking the medication regularly she noticed symptoms are better.  Encouraged to continue therapy with Winton Rubinstein.  Continue clonidine  0.2 mg at bedtime, Trintellix  20 mg daily, Prozac  20 mg daily and lithium  450 mg twice a day.  Will do labs including lithium  level.  She is seeing Dr. Arloa for primary care however has not seen in a while and it is hard to get the appointment because her PCP is retiring.  Will order CBC, CMP, hemoglobin A1c and  lithium  level.  Recommend to call back if you have any question or any concern.  Follow-up in 3 months   Follow Up Instructions:     I discussed the assessment and treatment plan with the patient. The patient was provided an opportunity to ask questions and all were answered. The patient agreed with the plan and demonstrated an understanding of the instructions.   The patient was advised to call back or seek an in-person evaluation if the symptoms worsen or if the condition fails to improve as anticipated.    Collaboration of Care: Other provider involved in patient's care AEB notes are available in epic to review  Patient/Guardian was advised Release of Information must be obtained prior to any record release in order to collaborate their care with an outside provider. Patient/Guardian was advised if they have not already done so to contact the registration department to sign all necessary forms in order for us  to release information regarding their care.   Consent: Patient/Guardian gives verbal consent for treatment and assignment of benefits for services provided during this visit. Patient/Guardian expressed understanding and agreed to proceed.     Total encounter time 19 minutes which includes face-to-face time, chart reviewed, care coordination, order entry and documentation during this encounter.   Note: This document was prepared by [employer] voice dictation technology and any errors that results from this process are unintentional.    Leni ONEIDA Client, MD 09/05/2023

## 2023-09-06 ENCOUNTER — Other Ambulatory Visit (HOSPITAL_COMMUNITY): Payer: Self-pay

## 2023-09-06 DIAGNOSIS — Z5181 Encounter for therapeutic drug level monitoring: Secondary | ICD-10-CM

## 2023-09-06 DIAGNOSIS — Z79899 Other long term (current) drug therapy: Secondary | ICD-10-CM

## 2023-09-10 ENCOUNTER — Ambulatory Visit (HOSPITAL_COMMUNITY): Admitting: Psychiatry

## 2023-09-11 ENCOUNTER — Other Ambulatory Visit (HOSPITAL_COMMUNITY): Payer: Self-pay | Admitting: Psychiatry

## 2023-09-11 DIAGNOSIS — F319 Bipolar disorder, unspecified: Secondary | ICD-10-CM

## 2023-09-11 DIAGNOSIS — F419 Anxiety disorder, unspecified: Secondary | ICD-10-CM

## 2023-09-11 DIAGNOSIS — F4310 Post-traumatic stress disorder, unspecified: Secondary | ICD-10-CM

## 2023-09-17 DIAGNOSIS — Z5181 Encounter for therapeutic drug level monitoring: Secondary | ICD-10-CM | POA: Diagnosis not present

## 2023-09-17 DIAGNOSIS — Z79899 Other long term (current) drug therapy: Secondary | ICD-10-CM | POA: Diagnosis not present

## 2023-09-19 ENCOUNTER — Other Ambulatory Visit (HOSPITAL_COMMUNITY): Payer: Self-pay | Admitting: *Deleted

## 2023-09-19 ENCOUNTER — Telehealth (HOSPITAL_COMMUNITY): Payer: Self-pay | Admitting: *Deleted

## 2023-09-19 DIAGNOSIS — Z79899 Other long term (current) drug therapy: Secondary | ICD-10-CM

## 2023-09-19 DIAGNOSIS — Z5181 Encounter for therapeutic drug level monitoring: Secondary | ICD-10-CM

## 2023-09-19 LAB — COMPREHENSIVE METABOLIC PANEL WITH GFR
ALT: 14 IU/L (ref 0–32)
AST: 20 IU/L (ref 0–40)
Albumin: 4.5 g/dL (ref 3.8–4.9)
Alkaline Phosphatase: 69 IU/L (ref 44–121)
BUN/Creatinine Ratio: 8 — ABNORMAL LOW (ref 9–23)
BUN: 8 mg/dL (ref 6–24)
Bilirubin Total: 0.4 mg/dL (ref 0.0–1.2)
CO2: 19 mmol/L — ABNORMAL LOW (ref 20–29)
Calcium: 10.7 mg/dL — ABNORMAL HIGH (ref 8.7–10.2)
Chloride: 106 mmol/L (ref 96–106)
Creatinine, Ser: 1.01 mg/dL — ABNORMAL HIGH (ref 0.57–1.00)
Globulin, Total: 2.4 g/dL (ref 1.5–4.5)
Glucose: 73 mg/dL (ref 70–99)
Potassium: 4.6 mmol/L (ref 3.5–5.2)
Sodium: 142 mmol/L (ref 134–144)
Total Protein: 6.9 g/dL (ref 6.0–8.5)
eGFR: 66 mL/min/1.73 (ref >59)

## 2023-09-19 LAB — CBC WITH DIFFERENTIAL/PLATELET
Basophils Absolute: 0.1 x10E3/uL (ref 0.0–0.2)
Basos: 2 %
EOS (ABSOLUTE): 0.1 x10E3/uL (ref 0.0–0.4)
Eos: 2 %
Hematocrit: 41.4 % (ref 34.0–46.6)
Hemoglobin: 13.1 g/dL (ref 11.1–15.9)
Immature Grans (Abs): 0 x10E3/uL (ref 0.0–0.1)
Immature Granulocytes: 0 %
Lymphocytes Absolute: 1.7 x10E3/uL (ref 0.7–3.1)
Lymphs: 31 %
MCH: 29.2 pg (ref 26.6–33.0)
MCHC: 31.6 g/dL (ref 31.5–35.7)
MCV: 92 fL (ref 79–97)
Monocytes Absolute: 0.3 x10E3/uL (ref 0.1–0.9)
Monocytes: 6 %
Neutrophils Absolute: 3.1 x10E3/uL (ref 1.4–7.0)
Neutrophils: 59 %
Platelets: 194 x10E3/uL (ref 150–450)
RBC: 4.48 x10E6/uL (ref 3.77–5.28)
RDW: 12.9 % (ref 11.7–15.4)
WBC: 5.3 x10E3/uL (ref 3.4–10.8)

## 2023-09-19 LAB — HEMOGLOBIN A1C
Est. average glucose Bld gHb Est-mCnc: 85 mg/dL
Hgb A1c MFr Bld: 4.6 % — ABNORMAL LOW (ref 4.8–5.6)

## 2023-09-19 LAB — LITHIUM LEVEL: Lithium Lvl: 1.7 mmol/L — AB (ref 0.5–1.2)

## 2023-09-19 NOTE — Telephone Encounter (Signed)
 Writer spoke with pt and she verbalizes understanding of repeat Lithium  level. Pt has not had any issues or c/o. Writer reiterated to hold a.m. dose prior to lab draw.  Lithium  level order sent to [facility].

## 2023-09-19 NOTE — Telephone Encounter (Signed)
 Her lithium  level is 1.7.  Her BUN 8 and creatinine 1.01.  We have not changed lithium  dose in a while.  In the past she had lithium  level 1.4 and repeat levels were normal.  I will encourage and suggest to repeat the level of lithium  and if still high then consider lowering the dose.  Please provide instruction about lithium  level.

## 2023-09-19 NOTE — Telephone Encounter (Signed)
 [facility] called with critical lab results for Lithium  level which is 1.7 mmol/L  Reference # [id number] [phone] option #1

## 2023-09-21 DIAGNOSIS — Z5181 Encounter for therapeutic drug level monitoring: Secondary | ICD-10-CM | POA: Diagnosis not present

## 2023-09-21 DIAGNOSIS — Z79899 Other long term (current) drug therapy: Secondary | ICD-10-CM | POA: Diagnosis not present

## 2023-09-22 LAB — LITHIUM LEVEL: Lithium Lvl: 1.1 mmol/L (ref 0.5–1.2)

## 2023-09-23 ENCOUNTER — Ambulatory Visit
Admission: EM | Admit: 2023-09-23 | Discharge: 2023-09-23 | Disposition: A | Discharge status: Home / Self Care | Attending: Nurse Practitioner | Admitting: Nurse Practitioner

## 2023-09-23 ENCOUNTER — Encounter: Payer: Self-pay | Admitting: Emergency Medicine

## 2023-09-23 DIAGNOSIS — R21 Rash and other nonspecific skin eruption: Secondary | ICD-10-CM | POA: Diagnosis not present

## 2023-09-23 MED ORDER — TRIAMCINOLONE ACETONIDE 0.1 % EX CREA: 1.0000 | TOPICAL_CREAM | Freq: Two times a day (BID) | CUTANEOUS | 0 refills | Status: AC

## 2023-09-23 MED ORDER — PREDNISONE 20 MG PO TABS: 40.0000 mg | ORAL_TABLET | Freq: Every day | ORAL | 0 refills | Status: AC

## 2023-09-23 MED ORDER — DEXAMETHASONE SODIUM PHOSPHATE 10 MG/ML IJ SOLN
10.0000 mg | INTRAMUSCULAR | Status: AC
  Administered 2023-09-23: 10 mg via INTRAMUSCULAR

## 2023-09-23 NOTE — Discharge Instructions (Signed)
 You were given an injection of Decadron  10 mg today.  Start the prednisone  tomorrow. Take medication as prescribed. Recommend over-the-counter Zyrtec, Claritin, or Allegra during the daytime for itching, you may continue Benadryl at bedtime. Avoid hot baths or showers while symptoms persist.  Recommend taking lukewarm baths. May apply cool cloths to the area to help with itching or discomfort. Avoid scratching, rubbing, or manipulating the areas while symptoms persist. Recommend Aveeno colloidal oatmeal bath to use to help with drying and itching. If symptoms fail to improve, you may follow-up in this clinic or with your primary care physician for further evaluation. Follow-up as needed.

## 2023-09-23 NOTE — ED Triage Notes (Signed)
 rash all over body x 5 days.  Has been taking benadryl

## 2023-09-23 NOTE — ED Provider Notes (Signed)
 [facility]    CSN: [id number] Arrival date & time: 09/23/23  9044      History   Chief Complaint No chief complaint on file.   HPI Susan Ball is a 53 y.o. female.   The history is provided by the patient.   Patient presents for complaints of rash has been present for the past several days.  Patient states the rash is located on her forehead, the right breast, the lower extremities, and the upper extremities.  She denies exposure to new soaps, medications, lotions, foods, or detergents.  She states that the rash is itchy.  States she has been using Benadryl for her symptoms.  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      Home Medications    Prior to Admission medications   Medication Sig Start Date End Date Taking? Authorizing Provider  albuterol (VENTOLIN HFA) 108 (90 Base) MCG/ACT inhaler SMARTSIG:2 Puff(s) By Mouth 4 Times Daily PRN 11/03/20   [provider]  cloNIDine  (CATAPRES ) 0.2 MG tablet Take 1 tablet (0.2 mg total) by mouth at bedtime. 09/05/23  12/04/23  [name], Leni DASEN, MD  Ferrous Sulfate (SLOW FE) 142 (45 Fe) MG TBCR 1 tablet 11/03/20   [provider]  FLUoxetine  (PROZAC ) 20 MG capsule Take 1 capsule (20 mg total) by mouth daily. 09/05/23 12/04/23  [name], Syed T, MD  fluticasone  (FLONASE ) 50 MCG/ACT nasal spray 2 sprays 05/23/22   Rolinda Rogue, MD  lithium  carbonate (ESKALITH ) 450 MG ER tablet Take one tab twice a day. 09/05/23   [name], Leni DASEN, MD  nitrofurantoin, macrocrystal-monohydrate, (MACROBID) 100 MG capsule Take 100 mg by mouth daily.    [provider]  solifenacin (VESICARE) 5 MG tablet Take 5 mg by mouth daily. 09/21/20   [provider]  Trimethoprim  HCl (TRIMPEX  PO) Take by mouth.    [provider]  vortioxetine  HBr (TRINTELLIX ) 20 MG TABS tablet Take 1 tablet (20 mg total) by mouth daily. 09/05/23   [name], Leni DASEN, MD    Family History Family History  Problem Relation Age of Onset   Depression Sister    Depression Brother     Social History Social History   Tobacco Use   Smoking status: Never   Smokeless tobacco: Never  Vaping Use   Vaping status: Never Used  Substance Use Topics   Alcohol use: Not Currently   Drug use: Not Currently    Types: Marijuana    Comment: States she stopped smoking THC in 2002     Allergies   Fexofenadine and Lamictal [lamotrigine]  Review of Systems Review of Systems Per HPI  Physical Exam Triage Vital Signs ED Triage Vitals  Encounter Vitals Group     BP 09/23/23 1000 108/74     Girls Systolic BP Percentile --      Girls Diastolic BP Percentile --      Boys Systolic BP Percentile --      Boys Diastolic BP Percentile --      Pulse Rate 09/23/23 1000 73     Resp 09/23/23 1000 18     Temp 09/23/23 1000 98.8 F (37.1 C)     Temp Source 09/23/23 1000 Oral     SpO2 09/23/23 1000 97 %     Weight --      Height --      Head Circumference --      Peak Flow --      Pain Score 09/23/23 1002 0     Pain Loc --      Pain  Education --      Exclude from Growth Chart --    No data found.  Updated Vital Signs BP 108/74 (BP Location: Right Arm)   Pulse 73   Temp 98.8 F (37.1 C) (Oral)   Resp 18   LMP 09/09/2023 (Approximate)   SpO2 97%   Visual Acuity Right Eye Distance:   Left Eye Distance:   Bilateral Distance:    Right Eye Near:   Left Eye Near:    Bilateral Near:     Physical Exam Vitals and nursing note reviewed.  Constitutional:      General: She is not in acute distress.    Appearance: Normal appearance.  HENT:     Head: Normocephalic.  Eyes:     Extraocular Movements: Extraocular movements intact.     Pupils: Pupils are equal, round, and reactive to light.  Cardiovascular:     Rate and Rhythm: Normal rate and regular rhythm.     Pulses: Normal pulses.     Heart sounds: Normal heart sounds.  Pulmonary:     Effort: Pulmonary effort is normal.     Breath sounds: Normal breath sounds.  Musculoskeletal:     Cervical back: Normal range of motion.  Skin:    General: Skin is warm and dry.     Findings: Rash present. Rash is macular and papular.     Comments: Macular papular rash noted to the right breast, bilateral upper extremities, forehead, and lower extremities.  There is no oozing, fluctuance, or drainage present.  Neurological:     General: No focal deficit present.     Mental Status: She is alert and oriented to person, place, and time.  Psychiatric:        Mood and Affect: Mood normal.        Behavior: Behavior normal.      UC Treatments / Results  Labs (all labs ordered are listed, but only abnormal results are displayed) Labs Reviewed - No data to display  EKG   Radiology No results found.  Procedures Procedures (including critical care time)  Medications Ordered in UC Medications  dexamethasone  (DECADRON ) injection 10 mg (has no administration in time range)    Initial Impression / Assessment and Plan / UC Course  I have reviewed the triage vital  signs and the nursing notes.  Pertinent labs & imaging results that were available during my care of the patient were reviewed by me and considered in my medical decision making (see chart for details).  Patient  with maculopapular rash to various areas on her body.  Patient does not recall any new triggers for her symptoms.  Difficult to determine the etiology of the rash.  Decadron  10 mg IM administered for itching and inflammation.  Will start patient on prednisone  40 mg for the next 5 days along with triamcinolone  cream 0.1% for patient to apply topically.  Supportive care recommendations were provided and discussed with the patient to include over-the-counter antihistamines, cool compresses, and use of Aveeno colloidal oatmeal bath.  Discussed indications with patient regarding follow-up.  Patient was in agreement with this plan of care and verbalizes understanding.  All questions were answered.  Patient stable for discharge.  Final Clinical Impressions(s) / UC Diagnoses   Final diagnoses:  None   Discharge Instructions   None    ED Prescriptions   None    PDMP not reviewed this encounter.   Gilmer Etta PARAS, NP 09/23/23 1041

## 2023-10-01 ENCOUNTER — Ambulatory Visit (HOSPITAL_COMMUNITY): Admitting: Psychiatry

## 2023-10-01 ENCOUNTER — Telehealth (HOSPITAL_COMMUNITY): Payer: Self-pay | Admitting: Psychiatry

## 2023-10-01 DIAGNOSIS — F319 Bipolar disorder, unspecified: Secondary | ICD-10-CM | POA: Diagnosis not present

## 2023-10-01 DIAGNOSIS — F603 Borderline personality disorder: Secondary | ICD-10-CM

## 2023-10-01 NOTE — Telephone Encounter (Signed)
 Therapist attempted to contact patient via text through caregility platform for scheduled appointment, no response.  Therapist called patient, left message indicating attempt, and requesting patient call office.

## 2023-10-01 NOTE — Progress Notes (Signed)
 Virtual Visit via Telephone Note  I connected with Susan Ball on 10/01/23 at 1:20 PM EDTby telephone and verified that I am speaking with the correct person using two identifiers.  Location: Patient: Home Provider: Home Office   I discussed the limitations, risks, security and privacy concerns of performing an evaluation and management service by telephone and the availability of in person appointments. I also discussed with the patient that there may be a patient responsible charge related to this service. The patient expressed understanding and agreed to proceed.     I provided 40 minutes of non-face-to-face time during this encounter.   Winton FORBES Rubinstein, LCSW  THERAPIST PROGRESS NOTE       Session Time: Monday  10/01/2023 1:20 PM - 2:00 PM  Participation Level: Active  Behavioral Response: CasualAlert/euthymic  Type of Therapy: Individual Therapy  Treatment Goals addressed: [name] will reduce the amount of anger related incident/outburst by 50% for 4 consecutive weeks/[name] will identify situations, thoughts, and feelings that trigger internal anger, angry verbal and or aggressive behavioral actions as evidenced by self recorded report  Progress on Goals: progressing   Interventions: CBT and Supportive  Summary: Susan Ball is a 53 y.o. female who  is referred for services by Ricka Gaskins. She just completed IOP on March 27, 2019. She has long standing history of bipolar disorder and borderline personality disorder. She has had 3 psychiatric hospitalizations. The last was in 2010 at [facility] due to suicide attempt. She participated in outpatient therapy intermittently at [facility] for 3 years. She last was seen there 6 months ago.  Current symptoms include depressed mood, crying spells, mood swings, aggression, anger, excessive worry, sleep difficulty, poor concentration, and memory difficulty.  Patient was seen via virtual visit about 6-7 weeks ago.  She reports  experiencing severely depressed mood, poor motivation, and suicidal ideations about 4 weeks ago.  She denies any current suicidal ideations and reports starting to feel better in the past 5 to 6 days.  She attributes feeling better to resuming use of marijuana for the past 5 days.  Patient could not identify any triggers for the depressive episode.  She states just waking up 1 morning feeling heavy and having no motivation.  She also reports experiencing increased irritability along with increased verbally aggressive outbursts when she was experiencing increased depressed mood.  She reports reducing her dosage of lithium  on her own when recent lab results indicated her lithium  level was elevated.  She reports worrying about her kidney function.  She has not informed Dr. [name] about reducing her medication.  However, she agrees to call Dr. Veto office today to express her concerns about the medication and schedule an earlier appointment if possible.  Patient reports still experiencing poor motivation and increased apathy.  She has missed days from work due to poor motivation.  She also reports decreased interest in going to the gym.  She expresses frustration as she states her house and car are a mess because she has not had the motivation to perform tasks.    Suicidal/Homicidal:  No, without intent or plan.  Patient agrees to call 911, [phone], or have someone take her to the ED should symptoms worsen.  Therapist Response:  reviewed symptoms, assisted patient try to identify triggers of increased depressed mood, discussed medication concerns  and the importance of medication compliance in working with psychiatrist Dr. [name], assisted patient identify ways to improve assertiveness skills to express her concerns to Dr. [name], developed  plan with patient to contact Dr. Veto office today to express concerns and schedule an earlier appointment if possible, assisted patient identify ways to increase behavioral  activation with the use of daily planning, assisted patient identify small steps, developed plan with patient to begin to wash the dishes daily, also developed pattern with patient to set consistent going to bedtime and wake up time   plan: Return again in 2 weeks.  Diagnosis: Axis I: Bipolar Disorder    Axis II: Borderline Personality Dis.  Collaboration of Care: Psychiatrist AEB patient seeing psychiatrist Dr. Curry, clinician reviewing chart  Patient/Guardian was advised Release of Information must be obtained prior to any record release in order to collaborate their care with an outside provider. Patient/Guardian was advised if they have not already done so to contact the registration department to sign all necessary forms in order for us  to release information regarding their care.   Consent: Patient/Guardian gives verbal consent for treatment and assignment of benefits for services provided during this visit. Patient/Guardian expressed understanding and agreed to proceed.   Winton FORBES Rubinstein, LCSW 10/01/2023

## 2023-10-08 DIAGNOSIS — Z1231 Encounter for screening mammogram for malignant neoplasm of breast: Secondary | ICD-10-CM | POA: Diagnosis not present

## 2023-10-15 ENCOUNTER — Ambulatory Visit (HOSPITAL_COMMUNITY): Admitting: Psychiatry

## 2023-10-15 DIAGNOSIS — F319 Bipolar disorder, unspecified: Secondary | ICD-10-CM | POA: Diagnosis not present

## 2023-10-15 DIAGNOSIS — F603 Borderline personality disorder: Secondary | ICD-10-CM | POA: Diagnosis not present

## 2023-10-15 NOTE — Progress Notes (Signed)
 Virtual Visit via Telephone Note  I connected with Susan Ball on 10/15/23 at 2:10 PM EDTby telephone and verified that I am speaking with the correct person using two identifiers.  Location: Patient: Car Provider: Home Office   I discussed the limitations, risks, security and privacy concerns of performing an evaluation and management service by telephone and the availability of in person appointments. I also discussed with the patient that there may be a patient responsible charge related to this service. The patient expressed understanding and agreed to proceed.     I provided  40 minutes of non-face-to-face time during this encounter.   Winton FORBES Rubinstein, LCSW  THERAPIST PROGRESS NOTE       Session Time: Monday  10/15/2023 2:10 PM  - 2:50 PM  Participation Level: Active  Behavioral Response: CasualAlert/euthymic  Type of Therapy: Individual Therapy  Treatment Goals addressed: Julianah will reduce the amount of anger related incident/outburst by 50% for 4 consecutive weeks/Tiarrah will identify situations, thoughts, and feelings that trigger internal anger, angry verbal and or aggressive behavioral actions as evidenced by self recorded report  Progress on Goals: progressing   Interventions: CBT and Supportive  Summary: Susan Ball is a 53 y.o. female who  is referred for services by Ricka Gaskins. She just completed IOP on March 27, 2019. She has long standing history of bipolar disorder and borderline personality disorder. She has had 3 psychiatric hospitalizations. The last was in 2010 at [facility] due to suicide attempt. She participated in outpatient therapy intermittently at [facility] for 3 years. She last was seen there 6 months ago.  Current symptoms include depressed mood, crying spells, mood swings, aggression, anger, excessive worry, sleep difficulty, poor concentration, and memory difficulty.  Patient was seen via virtual visit about 4 weeks ago.  She reports much  improved mood, improved sleep pattern, and increased behavioral activation since last session.  Patient reports still not contacting Dr. [name] but continuing to take decreased dosage of lithium .  Per patient's report, she takes a 450 mg dose in the morning but does not take an evening dose.  Patient reports feeling much better.  She reports even mood.  She has only had 1 outburst since last session per her report.  She has returned to work and has resumed normal interest/involvement in activities.  [facility] [employer] [facility] and recently began taking swimming lessons.  She also reports enjoying a recent weekend trip to the beach with her siblings and her mother.  Patient reports enjoying having a break from her caretaker responsibilities for her husband.    Suicidal/Homicidal:  No, without intent or plan.  Patient agrees to call 911, [phone], or have someone take her to the ED should symptoms worsen.  Therapist Response:  reviewed symptoms, praised and reinforced patient's increased behavioral activation/socialization, discussed effects, reviewed the role of medication compliance, encouraged patient to notify Dr. [name] regarding change in dosage, reviewed psychoeducation of bipolar disorder, assisted patient identify early warning signs of mood changes, assisted patient identify ways to intervene to avoid a relapse, encouraged patient to continue regular physical activity, began to discuss next steps for treatment, discussed anger management issues and emotional regulation, assisted patient identify triggers which mainly include ending situation which patient perceives she is being disrespected, will discuss more next session  plan: Return again in 2 weeks.  Diagnosis: Axis I: Bipolar Disorder    Axis II: Borderline Personality Dis.  Collaboration of Care: Psychiatrist AEB patient seeing psychiatrist  Dr. Curry, clinician reviewing chart  Patient/Guardian was advised Release of Information  must be obtained prior to any record release in order to collaborate their care with an outside provider. Patient/Guardian was advised if they have not already done so to contact the registration department to sign all necessary forms in order for us  to release information regarding their care.   Consent: Patient/Guardian gives verbal consent for treatment and assignment of benefits for services provided during this visit. Patient/Guardian expressed understanding and agreed to proceed.   Winton FORBES Rubinstein, LCSW 10/15/2023

## 2023-10-16 DIAGNOSIS — L732 Hidradenitis suppurativa: Secondary | ICD-10-CM | POA: Diagnosis not present

## 2023-10-16 DIAGNOSIS — Z136 Encounter for screening for cardiovascular disorders: Secondary | ICD-10-CM | POA: Diagnosis not present

## 2023-10-16 DIAGNOSIS — Z0000 Encounter for general adult medical examination without abnormal findings: Secondary | ICD-10-CM | POA: Diagnosis not present

## 2023-10-16 DIAGNOSIS — L509 Urticaria, unspecified: Secondary | ICD-10-CM | POA: Diagnosis not present

## 2023-10-16 DIAGNOSIS — N3281 Overactive bladder: Secondary | ICD-10-CM | POA: Diagnosis not present

## 2023-10-16 DIAGNOSIS — E559 Vitamin D deficiency, unspecified: Secondary | ICD-10-CM | POA: Diagnosis not present

## 2023-10-16 DIAGNOSIS — D508 Other iron deficiency anemias: Secondary | ICD-10-CM | POA: Diagnosis not present

## 2023-10-16 DIAGNOSIS — J302 Other seasonal allergic rhinitis: Secondary | ICD-10-CM | POA: Diagnosis not present

## 2023-10-29 ENCOUNTER — Ambulatory Visit (HOSPITAL_COMMUNITY): Admitting: Psychiatry

## 2023-11-04 ENCOUNTER — Other Ambulatory Visit: Payer: Self-pay

## 2023-11-04 ENCOUNTER — Encounter (HOSPITAL_COMMUNITY): Payer: Self-pay | Admitting: Emergency Medicine

## 2023-11-04 ENCOUNTER — Emergency Department (HOSPITAL_COMMUNITY)
Admission: EM | Admit: 2023-11-04 | Discharge: 2023-11-04 | Disposition: A | Discharge status: Home / Self Care | Attending: Emergency Medicine | Admitting: Emergency Medicine

## 2023-11-04 DIAGNOSIS — R112 Nausea with vomiting, unspecified: Secondary | ICD-10-CM | POA: Diagnosis not present

## 2023-11-04 DIAGNOSIS — R739 Hyperglycemia, unspecified: Secondary | ICD-10-CM | POA: Insufficient documentation

## 2023-11-04 DIAGNOSIS — R1033 Periumbilical pain: Secondary | ICD-10-CM | POA: Diagnosis not present

## 2023-11-04 DIAGNOSIS — Z79899 Other long term (current) drug therapy: Secondary | ICD-10-CM | POA: Diagnosis not present

## 2023-11-04 LAB — CBC WITH DIFFERENTIAL/PLATELET
Abs Immature Granulocytes: 0.02 K/uL (ref 0.00–0.07)
Basophils Absolute: 0.1 K/uL (ref 0.0–0.1)
Basophils Relative: 2 %
Eosinophils Absolute: 0.1 K/uL (ref 0.0–0.5)
Eosinophils Relative: 2 %
HCT: 40.1 % (ref 36.0–46.0)
Hemoglobin: 12.9 g/dL (ref 12.0–15.0)
Immature Granulocytes: 0 %
Lymphocytes Relative: 19 %
Lymphs Abs: 1.4 K/uL (ref 0.7–4.0)
MCH: 28.9 pg (ref 26.0–34.0)
MCHC: 32.2 g/dL (ref 30.0–36.0)
MCV: 89.9 fL (ref 80.0–100.0)
Monocytes Absolute: 0.4 K/uL (ref 0.1–1.0)
Monocytes Relative: 6 %
Neutro Abs: 5.3 K/uL (ref 1.7–7.7)
Neutrophils Relative %: 71 %
Platelets: 273 K/uL (ref 150–400)
RBC: 4.46 MIL/uL (ref 3.87–5.11)
RDW: 13.6 % (ref 11.5–15.5)
WBC: 7.4 K/uL (ref 4.0–10.5)
nRBC: 0 % (ref 0.0–0.2)

## 2023-11-04 LAB — COMPREHENSIVE METABOLIC PANEL WITH GFR
ALT: 19 U/L (ref 0–44)
AST: 26 U/L (ref 15–41)
Albumin: 3.9 g/dL (ref 3.5–5.0)
Alkaline Phosphatase: 78 U/L (ref 38–126)
Anion gap: 11 (ref 5–15)
BUN: 10 mg/dL (ref 6–20)
CO2: 23 mmol/L (ref 22–32)
Calcium: 10 mg/dL (ref 8.9–10.3)
Chloride: 105 mmol/L (ref 98–111)
Creatinine, Ser: 0.79 mg/dL (ref 0.44–1.00)
GFR, Estimated: >60 mL/min (ref >60)
Glucose, Bld: 102 mg/dL — ABNORMAL HIGH (ref 70–99)
Potassium: 4 mmol/L (ref 3.5–5.1)
Sodium: 139 mmol/L (ref 135–145)
Total Bilirubin: 0.3 mg/dL (ref 0.0–1.2)
Total Protein: 6.7 g/dL (ref 6.5–8.1)

## 2023-11-04 LAB — ACETAMINOPHEN LEVEL: Acetaminophen (Tylenol), Serum: <10 ug/mL — ABNORMAL LOW (ref 10–30)

## 2023-11-04 LAB — LIPASE, BLOOD: Lipase: 16 U/L (ref 11–51)

## 2023-11-04 MED ORDER — MORPHINE SULFATE (PF) 4 MG/ML IV SOLN
4.0000 mg | Freq: Once | INTRAVENOUS | Status: AC
  Administered 2023-11-04: 4 mg via INTRAVENOUS
  Filled 2023-11-04: qty 1

## 2023-11-04 MED ORDER — SODIUM CHLORIDE 0.9 % IV BOLUS
1000.0000 mL | Freq: Once | INTRAVENOUS | Status: AC
  Administered 2023-11-04: 1000 mL via INTRAVENOUS

## 2023-11-04 MED ORDER — ONDANSETRON HCL 4 MG/2ML IJ SOLN
4.0000 mg | Freq: Once | INTRAMUSCULAR | Status: AC
  Administered 2023-11-04: 4 mg via INTRAVENOUS
  Filled 2023-11-04: qty 2

## 2023-11-04 MED ORDER — ONDANSETRON 4 MG PO TBDP: 4.0000 mg | ORAL_TABLET | Freq: Three times a day (TID) | ORAL | 0 refills | Status: AC | PRN

## 2023-11-04 NOTE — ED Notes (Signed)
 ED Provider at bedside.

## 2023-11-04 NOTE — ED Triage Notes (Addendum)
 Pt had recent dental work, lost 10lbs from not eating. States started vomiting after taking daily meds because she didn't have anything on her stomach. Pt states she also has a sinus infection and cannot sleep and that makes her vomit as well.

## 2023-11-04 NOTE — ED Provider Notes (Signed)
 [place] EMERGENCY DEPARTMENT AT [facility] Provider Note   CSN: [id number] Arrival date & time: 11/04/23  9785     Patient presents with: Emesis   Susan Ball is a 53 y.o. female.   The history is provided by the patient.  Emesis  She has history of bipolar disorder and comes in because of vomiting.  She states that she had upper teeth extracted 1 week ago, but was not able to wear her partial plate because of pain.  She has been able to drink fluids, but has not been able to eat since then.  She states she has continued to take her medication.  Over the course of today, she has developed vomiting and states that she has vomited a large amount.  She is now not able to tolerate even clear liquids.  She is also complaining of some crampy periumbilical pain.  She has had bowel movement today and she has passed flatus.  She has no history of abdominal surgery.  Of note, she states that she has been taking acetaminophen  1000 mg every 8 hours for pain control.    Prior to Admission medications   Medication Sig Start Date End Date Taking? Authorizing Provider  albuterol (VENTOLIN HFA) 108 (90 Base) MCG/ACT inhaler SMARTSIG:2 Puff(s) By Mouth 4 Times Daily PRN 11/03/20   [provider]  cloNIDine  (CATAPRES ) 0.2 MG tablet Take 1 tablet (0.2 mg total) by mouth at bedtime. 09/05/23 12/04/23  [name], Leni DASEN, MD  Ferrous Sulfate (SLOW FE) 142 (45 Fe) MG TBCR 1 tablet 11/03/20   [provider]  FLUoxetine  (PROZAC ) 20 MG capsule Take 1 capsule (20 mg total) by mouth daily. 09/05/23 12/04/23  [name], Syed T, MD  fluticasone  (FLONASE ) 50 MCG/ACT nasal spray 2 sprays 05/23/22   Rolinda Rogue, MD  lithium  carbonate (ESKALITH ) 450 MG ER tablet Take one tab twice a day. 09/05/23   [name], Leni DASEN, MD  nitrofurantoin, macrocrystal-monohydrate, (MACROBID) 100 MG capsule Take 100 mg by mouth daily.    [provider]  solifenacin (VESICARE) 5 MG tablet Take 5 mg by  mouth daily. 09/21/20   [provider]  triamcinolone  cream (KENALOG ) 0.1 % Apply 1 Application topically 2 (two) times daily. 09/23/23   Leath-Warren, Etta PARAS, NP  Trimethoprim  HCl (TRIMPEX  PO) Take by mouth.    [provider]  vortioxetine  HBr (TRINTELLIX ) 20 MG TABS tablet Take 1 tablet (20 mg total) by mouth daily. 09/05/23   [name], Leni DASEN, MD    Allergies: Fexofenadine and Lamictal [lamotrigine]    Review of Systems  Gastrointestinal:  Positive for vomiting.  All other systems reviewed and are negative.   Updated Vital Signs BP (!) 158/86 (BP Location: Left Arm)   Pulse 62   Temp 98.2 F (36.8 C)   Resp 16   Ht 5' 5 (1.651 m)   Wt 88.5 kg   LMP 10/13/2023 (Approximate)   SpO2 100%   BMI 32.45 kg/m   Physical Exam Vitals and nursing note reviewed.   53 year old female, resting comfortably and in no acute distress. Vital signs are significant for elevated blood pressure. Oxygen saturation is 100%, which is normal. Head is normocephalic and atraumatic. PERRLA, EOMI. there is mild swelling of the upper gingiva which shows sequelae of recent dental extractions. Neck is nontender and supple without adenopathy. Lungs are clear without rales, wheezes, or rhonchi. Heart has regular rate and rhythm without murmur. Abdomen is soft, flat, nontender. Extremities have  no cyanosis or edema, full range of motion is present. Skin is warm and dry without rash. Neurologic: Mental status is normal, moves all extremities equally.  (all labs ordered are listed, but only abnormal results are displayed) Labs Reviewed  COMPREHENSIVE METABOLIC PANEL WITH GFR  ACETAMINOPHEN  LEVEL  CBC WITH DIFFERENTIAL/PLATELET  LIPASE, BLOOD    EKG: None  Radiology: No results found.   Procedures   Medications Ordered in the ED - No data to display                                  Medical Decision Making Amount and/or Complexity of Data Reviewed Labs:  ordered.  Risk Prescription drug management.   Nausea and vomiting.  Differential diagnosis includes viral gastritis, bowel obstruction, gastric outlet obstruction.  Also, consider possible hepatic toxicity from excess acetaminophen  use.  I have ordered IV fluids, ondansetron for nausea, screening labs including acetaminophen  level.  I have reviewed her laboratory tests, my interpretation is normal CBC, normal lipase, undetectable acetaminophen , borderline elevated random glucose, normal electrolytes, normal renal function, normal transaminases.  She feels much better following above-noted treatment.  I am discharging her with a prescription for ondansetron oral dissolving tablet.  She will need to continue working with her dentist regarding the fit of her partial plate.     Final diagnoses:  Nausea and vomiting, unspecified vomiting type  Elevated random blood glucose level    ED Discharge Orders          Ordered    ondansetron (ZOFRAN-ODT) 4 MG disintegrating tablet  Every 8 hours PRN        11/04/23 0400               Raford Lenis, MD 11/04/23 [phone]

## 2023-11-04 NOTE — Discharge Instructions (Addendum)
 We with your dentist regarding your new partial plate.  Return to the emergency department if you have any new or concerning symptoms.

## 2023-11-12 ENCOUNTER — Ambulatory Visit (HOSPITAL_COMMUNITY): Admitting: Psychiatry

## 2023-11-12 DIAGNOSIS — F319 Bipolar disorder, unspecified: Secondary | ICD-10-CM

## 2023-11-12 DIAGNOSIS — F603 Borderline personality disorder: Secondary | ICD-10-CM | POA: Diagnosis not present

## 2023-11-12 NOTE — Progress Notes (Addendum)
 Virtual Visit via Video Note  I connected with Susan Ball on 11/12/23 at 2:03 PM EDT by a video enabled telemedicine application and verified that I am speaking with the correct person using two identifiers.  Location: Patient: Home Provider: Home Office   I discussed the limitations of evaluation and management by telemedicine and the availability of in person appointments. The patient expressed understanding and agreed to proceed.  .    I provided  46 minutes of non-face-to-face time during this encounter.   Susan FORBES Rubinstein, LCSW  THERAPIST PROGRESS NOTE       Session Time: Monday  11/12/2023 2:03 PM  - 2:49 PM  Participation Level: Active  Behavioral Response: CasualAlert/euthymic  Type of Therapy: Individual Therapy  Treatment Goals addressed: Susan Ball will reduce the amount of anger related incident/outburst by 55% for 4 consecutive weeks/Susan Ball will identify situations, thoughts, and feelings that trigger internal anger, angry verbal and or aggressive behavioral actions as evidenced by self recorded report  Progress on Goals: progressing   Interventions: CBT and Supportive  Summary: Susan Ball is a 53 y.o. female who  is referred for services by Ricka Gaskins. She just completed IOP on March 27, 2019. She has long standing history of bipolar disorder and borderline personality disorder. She has had 3 psychiatric hospitalizations. The last was in 2010 at [facility] due to suicide attempt. She participated in outpatient therapy intermittently at [facility] for 3 years. She last was seen there 6 months ago.  Current symptoms include depressed mood, crying spells, mood swings, aggression, anger, excessive worry, sleep difficulty, poor concentration, and memory difficulty.  Patient was seen via virtual visit about 4 weeks ago.  She states her mood has been up and down.  Per her report, this was triggered by disruptions in her eating patterns, sleep patterns, and  discontinuing use of her medication for about 3 days due to experiencing dental complications.  Patient reports having multiple tooth extractions and experiencing severe pain along with dehydration.  As a result, she reports going to the ED.  She is feeling better now and has resumed taking medication.  She reports being pleased with the way she managed her interaction with dental staff during her follow-up appointment.  She was angry but avoided being aggressive.  Patient reports being assertive and being able to develop a positive resolution with the dentist.  She reports having a major outburst at the gym when she felt disrespected by a gym worker who yelled at patient.  She responded by becoming verbally aggressive with the worker.    Suicidal/Homicidal:  No, without intent or plan.  Patient agrees to call 911, 988, or have someone take her to the ED should symptoms worsen.  Therapist Response:  reviewed symptoms, discussed stressors, facilitated expression of thoughts and feelings, validated feelings, praised and reinforced patient's efforts to use assertiveness skills im meeting with dentist as opposed to being aggressive, discussed effects of use of assertiveness skills, assisted patient do behavioral analysis recent interaction with gym worker, provided psychoeducation on the threat/response system in the body, assisted patient identify possible effects of childhood and trauma history on her threat/response system, discussed nonverbal physical and emotional memories versus verbal memories, discussed rationale for and assisted patient  practice mindfulness observation to help regulate emotions, assisted patient practice breath awareness to improve mindfulness skills, developed plan with patient to implement strategies discussed in session   plan: Return again in 2 weeks.  Diagnosis: Axis I: Bipolar Disorder  Axis II: Borderline Personality Dis.  Collaboration of Care: Psychiatrist AEB patient  seeing psychiatrist Dr. Curry, clinician reviewing chart  Patient/Guardian was advised Release of Information must be obtained prior to any record release in order to collaborate their care with an outside provider. Patient/Guardian was advised if they have not already done so to contact the registration department to sign all necessary forms in order for us  to release information regarding their care.   Consent: Patient/Guardian gives verbal consent for treatment and assignment of benefits for services provided during this visit. Patient/Guardian expressed understanding and agreed to proceed.   Susan FORBES Rubinstein, LCSW 11/12/2023

## 2023-11-13 DIAGNOSIS — R350 Frequency of micturition: Secondary | ICD-10-CM | POA: Diagnosis not present

## 2023-11-13 DIAGNOSIS — N3020 Other chronic cystitis without hematuria: Secondary | ICD-10-CM | POA: Diagnosis not present

## 2023-12-03 ENCOUNTER — Encounter (HOSPITAL_COMMUNITY): Payer: Self-pay | Admitting: Psychiatry

## 2023-12-03 ENCOUNTER — Telehealth (HOSPITAL_COMMUNITY): Admitting: Psychiatry

## 2023-12-03 VITALS — Wt 197.0 lb

## 2023-12-03 DIAGNOSIS — F6089 Other specific personality disorders: Secondary | ICD-10-CM | POA: Diagnosis not present

## 2023-12-03 DIAGNOSIS — F319 Bipolar disorder, unspecified: Secondary | ICD-10-CM | POA: Diagnosis not present

## 2023-12-03 DIAGNOSIS — F419 Anxiety disorder, unspecified: Secondary | ICD-10-CM

## 2023-12-03 DIAGNOSIS — F4310 Post-traumatic stress disorder, unspecified: Secondary | ICD-10-CM | POA: Diagnosis not present

## 2023-12-03 MED ORDER — CLONIDINE HCL 0.2 MG PO TABS: 0.2000 mg | ORAL_TABLET | Freq: Every day | ORAL | 0 refills | Status: AC

## 2023-12-03 MED ORDER — LITHIUM CARBONATE ER 450 MG PO TBCR: EXTENDED_RELEASE_TABLET | ORAL | 2 refills | Status: AC

## 2023-12-03 MED ORDER — FLUOXETINE HCL 20 MG PO CAPS: 20.0000 mg | ORAL_CAPSULE | Freq: Every day | ORAL | 0 refills | Status: AC

## 2023-12-03 MED ORDER — VORTIOXETINE HBR 20 MG PO TABS: 20.0000 mg | ORAL_TABLET | Freq: Every day | ORAL | 0 refills | Status: AC
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
## 2023-12-05 ENCOUNTER — Telehealth (HOSPITAL_COMMUNITY): Admitting: Psychiatry

## 2023-12-07 ENCOUNTER — Ambulatory Visit (INDEPENDENT_AMBULATORY_CARE_PROVIDER_SITE_OTHER): Admitting: Psychiatry

## 2023-12-07 DIAGNOSIS — F319 Bipolar disorder, unspecified: Secondary | ICD-10-CM

## 2023-12-07 DIAGNOSIS — F603 Borderline personality disorder: Secondary | ICD-10-CM | POA: Diagnosis not present
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
## 2023-12-24 ENCOUNTER — Telehealth (HOSPITAL_COMMUNITY): Payer: Self-pay | Admitting: Psychiatry

## 2023-12-24 ENCOUNTER — Ambulatory Visit (INDEPENDENT_AMBULATORY_CARE_PROVIDER_SITE_OTHER): Admitting: Psychiatry

## 2023-12-24 DIAGNOSIS — Z0389 Encounter for observation for other suspected diseases and conditions ruled out: Secondary | ICD-10-CM

## 2023-12-24 NOTE — Telephone Encounter (Signed)
Therapist attempted to contact patient via text through caregility platform, no response.  Therapist called patient, left message indicating attempt, and requesting patient call office.

## 2023-12-24 NOTE — Progress Notes (Signed)
 Therapist attempted to contact patient via text through caregility platform for scheduled appointment, no response.  Therapist called patient, left message indicating attempt and requesting patient call office.  Therapist waited for patient for 10 minutes.

## 2023-12-25 ENCOUNTER — Telehealth (HOSPITAL_COMMUNITY): Payer: Self-pay | Admitting: *Deleted

## 2023-12-25 ENCOUNTER — Other Ambulatory Visit (HOSPITAL_COMMUNITY): Payer: Self-pay | Admitting: *Deleted

## 2023-12-25 MED ORDER — LURASIDONE HCL 20 MG PO TABS: 20.0000 mg | ORAL_TABLET | Freq: Two times a day (BID) | ORAL | 0 refills | Status: DC

## 2023-12-25 NOTE — Telephone Encounter (Signed)
 Pt agrees and Rx sent to preferred pharmacy. Med ed initiated and pt encouraged to call us  for earlier appointment if no improvement seen.

## 2023-12-25 NOTE — Telephone Encounter (Signed)
 We can add Latuda 20 mg twice a day and if no improvement need to see her sooner than her scheduled appointment.  If she agree please call the medication to the pharmacy.

## 2023-12-25 NOTE — Telephone Encounter (Signed)
 Pt called with c/o increased anxiety and lability. Pt says the mood swings are really bad. This has been getting worse over the last week she says, and doesn't know why or what to do. She has an upcoming appointment with therapist Alvester Bynum) on 01/07/24. The last Lithium  level was resulted 09/21/23 @ 1.1 mmol/L. Please review and advise.    Last visit: 12/05/23 Next visit: 03/04/24

## 2023-12-26 ENCOUNTER — Other Ambulatory Visit (HOSPITAL_COMMUNITY): Payer: Self-pay | Admitting: *Deleted

## 2023-12-26 MED ORDER — LURASIDONE HCL 40 MG PO TABS: 20.0000 mg | ORAL_TABLET | Freq: Two times a day (BID) | ORAL | 1 refills | Status: AC

## 2024-01-07 ENCOUNTER — Ambulatory Visit (INDEPENDENT_AMBULATORY_CARE_PROVIDER_SITE_OTHER): Admitting: Psychiatry

## 2024-01-07 DIAGNOSIS — F319 Bipolar disorder, unspecified: Secondary | ICD-10-CM

## 2024-01-07 DIAGNOSIS — F6089 Other specific personality disorders: Secondary | ICD-10-CM

## 2024-01-07 DIAGNOSIS — F603 Borderline personality disorder: Secondary | ICD-10-CM

## 2024-01-07 NOTE — Progress Notes (Signed)
 Virtual Visit via Telephone Note  I connected with Susan Ball on 01/07/2024 at 1:06 PM EST by telephone and verified that I am speaking with the correct person using two identifiers.  Location: Patient: Car Provider: Home Office   I discussed the limitations, risks, security and privacy concerns of performing an evaluation and management service by telephone and the availability of in person appointments. I also discussed with the patient that there may be a patient responsible charge related to this service. The patient expressed understanding and agreed to proceed.   I provided 47 minutes of non-face-to-face time during this encounter.   Susan FORBES Rubinstein, LCSW    THERAPIST PROGRESS NOTE       Session Time:  Monday 01/07/2024 1:06 PM - 1:53 PM  Participation Level: Active  Behavioral Response: CasualAlert/euthymic  Type of Therapy: Individual Therapy  Treatment Goals addressed: Susan Ball will reduce the amount of anger related incident/outburst by 50% for 4 consecutive weeks/Susan Ball will identify situations, thoughts, and feelings that trigger internal anger, angry verbal and or aggressive behavioral actions as evidenced by self recorded report  Progress on Goals: progressing   Interventions: CBT and Supportive  Summary: Susan Ball is a 53 y.o. female who  is referred for services by Ricka Gaskins. She just completed IOP on March 27, 2019. She has long standing history of bipolar disorder and borderline personality disorder. She has had 3 psychiatric hospitalizations. The last was in 2010 at [facility] due to suicide attempt. She participated in outpatient therapy intermittently at [facility] for 3 years. She last was seen there 6 months ago.  Current symptoms include depressed mood, crying spells, mood swings, aggression, anger, excessive worry, sleep difficulty, poor concentration, and memory difficulty.  Patient was seen via virtual visit about 4 weeks ago.  Per her  report, she has experienced increased irritability, anger, and lashing out episodes.  Patient reports also experiencing increased crying spells and says this prompted her to call Dr. Curry.  Per patient's report, she was prescribed Latuda  and began taking it 2 weeks ago.  She says it has helped some but still waiting for it to be more effective.  Patient initially has difficulty identifying possible triggers but eventually identifies dental issues, changes in her physical appearance and incident with her mother during Thanksgiving as a possible triggers.  Patient reports becoming very angry as well as embarrassed regarding the incident with her mother.  This also triggered memories of negative comments and treatment from her mother throughout her life.  Patient reports difficulty being assertive with mother as she fears she will be verbally aggressive to mother resulting in the end of their relationship.     Suicidal/homicidal: No/with no intent no plan  Therapist Response:  reviewed symptoms, praised and reinforced patient's recognition of her thoughts and behaviors/her initiative and contacting Dr. [name]/medication compliance, assisted patient identify possible triggers of increased irritability and anger, facilitated patient sharing more information regarding her relationship with her mother, facilitated patient expressing thoughts and feelings regarding her childhood with her mother, assisted patient identify possible effects on current functioning, discussed the role of identifying and processing emotions, discussed rationale and developed plan with patient to begin to journal her thoughts and feelings regarding incidents from childhood still evoking anger as well as current incidents, began to discuss focusing more on improving assertiveness skills     plan: Return again in 2 weeks.  Diagnosis: Axis I: Bipolar Disorder    Axis II: Borderline Personality Dis.  Collaboration  of Care:  Psychiatrist AEB patient seeing psychiatrist Dr. Curry, clinician reviewing chart  Patient/Guardian was advised Release of Information must be obtained prior to any record release in order to collaborate their care with an outside provider. Patient/Guardian was advised if they have not already done so to contact the registration department to sign all necessary forms in order for us  to release information regarding their care.   Consent: Patient/Guardian gives verbal consent for treatment and assignment of benefits for services provided during this visit. Patient/Guardian expressed understanding and agreed to proceed.   Susan FORBES Rubinstein, LCSW 01/07/2024

## 2024-01-21 ENCOUNTER — Ambulatory Visit (INDEPENDENT_AMBULATORY_CARE_PROVIDER_SITE_OTHER): Admitting: Psychiatry

## 2024-01-21 DIAGNOSIS — F319 Bipolar disorder, unspecified: Secondary | ICD-10-CM | POA: Diagnosis not present

## 2024-01-21 DIAGNOSIS — F603 Borderline personality disorder: Secondary | ICD-10-CM | POA: Diagnosis not present

## 2024-01-21 NOTE — Progress Notes (Signed)
 "  Virtual Visit via Telephone Note  I connected with Susan Ball on 01/21/2024 at 1:06 PM EST by telephone and verified that I am speaking with the correct person using two identifiers.  Location: Patient: Home Provider: Home Office   I discussed the limitations, risks, security and privacy concerns of performing an evaluation and management service by telephone and the availability of in person appointments. I also discussed with the patient that there may be a patient responsible charge related to this service. The patient expressed understanding and agreed to proceed.   I provided 43  minutes of non-face-to-face time during this encounter.   Winton FORBES Rubinstein, LCSW    THERAPIST PROGRESS NOTE       Session Time:  Monday 01/21/2024 1:06 PM - 1:49 PM  Participation Level: Active  Behavioral Response: CasualAlert/angry  Type of Therapy: Individual Therapy  Treatment Goals addressed: Oliver will reduce the amount of anger related incident/outburst by 50% for 4 consecutive weeks/Susan Ball will identify situations, thoughts, and feelings that trigger internal anger, angry verbal and or aggressive behavioral actions as evidenced by self recorded report  Progress on Goals: progressing   Interventions: CBT and Supportive  Summary: Susan Ball is a 53 y.o. female who  is referred for services by Ricka Gaskins. She just completed IOP on March 27, 2019. She has long standing history of bipolar disorder and borderline personality disorder. She has had 3 psychiatric hospitalizations. The last was in 2010 at [facility] due to suicide attempt. She participated in outpatient therapy intermittently at [facility] for 3 years. She last was seen there 6 months ago.  Current symptoms include depressed mood, crying spells, mood swings, aggression, anger, excessive worry, sleep difficulty, poor concentration, and memory difficulty.  Patient was seen via virtual visit about 2 weeks ago.  Per her report,  she continued to experience irritability 10 out of the last 14 days.  However, she reports decreased lashing out behavior and reports having only 1 incident in the past 14 days.  She reports feeling better since Christmas is over as she usually has so many obligations regarding family get-togethers during this season.  She reports enjoying Christmas day as she spent most of the time on the road or being at home alone while her husband visited his family in [place].  Patient reports still continuing to experience significant anger.  She continues to disclose more information about her childhood and her relationship with her mother.  She verbalizes anger but states still having difficulty considering standing up or being assertive with mother.  Patient reports she started to journal but just did not get to it.  Patient reports medication compliance for the most part but states continued reluctance to take lithium  at prescribed level due to recent negative experiences.    Suicidal/homicidal: No/with no intent no plan  Therapist Response:  reviewed symptoms, praised and reinforced decreased lashing out episodes, facilitated patient sharing more information regarding her relationship with her mother, facilitated patient expressing thoughts and feelings regarding her childhood with her mother, assisted patient began to identify underlying feelings beneath anger, reviewed rationale for using a feelings journal, developed plan with patient to begin journaling her feelings, will send patient and emotions wheel via mail to assist patient in her efforts, began to assist patient identify thoughts inhibiting effective assertion      plan: Return again in 2 weeks.  Diagnosis: Axis I: Bipolar Disorder    Axis II: Borderline Personality Dis.  Collaboration of Care: Psychiatrist  AEB patient seeing psychiatrist Dr. Curry, clinician reviewing chart  Patient/Guardian was advised Release of Information must be obtained  prior to any record release in order to collaborate their care with an outside provider. Patient/Guardian was advised if they have not already done so to contact the registration department to sign all necessary forms in order for us  to release information regarding their care.   Consent: Patient/Guardian gives verbal consent for treatment and assignment of benefits for services provided during this visit. Patient/Guardian expressed understanding and agreed to proceed.   Winton FORBES Rubinstein, LCSW 01/21/2024          "

## 2024-01-30 ENCOUNTER — Other Ambulatory Visit (HOSPITAL_COMMUNITY): Payer: Self-pay | Admitting: Psychiatry

## 2024-01-30 DIAGNOSIS — F319 Bipolar disorder, unspecified: Secondary | ICD-10-CM

## 2024-01-30 DIAGNOSIS — F419 Anxiety disorder, unspecified: Secondary | ICD-10-CM

## 2024-02-11 ENCOUNTER — Ambulatory Visit (HOSPITAL_COMMUNITY): Admitting: Psychiatry

## 2024-02-11 DIAGNOSIS — F603 Borderline personality disorder: Secondary | ICD-10-CM

## 2024-02-11 DIAGNOSIS — F319 Bipolar disorder, unspecified: Secondary | ICD-10-CM | POA: Diagnosis not present
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
## 2024-02-25 ENCOUNTER — Ambulatory Visit (HOSPITAL_COMMUNITY): Admitting: Psychiatry

## 2024-02-25 DIAGNOSIS — F319 Bipolar disorder, unspecified: Secondary | ICD-10-CM

## 2024-03-04 ENCOUNTER — Telehealth (HOSPITAL_COMMUNITY): Admitting: Psychiatry

## 2024-03-31 ENCOUNTER — Ambulatory Visit (HOSPITAL_COMMUNITY): Admitting: Psychiatry

## 2024-04-14 ENCOUNTER — Ambulatory Visit (HOSPITAL_COMMUNITY): Admitting: Psychiatry

## 2024-04-28 ENCOUNTER — Ambulatory Visit (HOSPITAL_COMMUNITY): Admitting: Psychiatry
